# Patient Record
Sex: Male | Born: 1965 | Race: White | Hispanic: No | Marital: Married | Smoking: Never smoker
Health system: Southern US, Community
[De-identification: ages and names within clinical notes are randomized; demographics above are authoritative.]

## PROBLEM LIST (undated history)

## (undated) DIAGNOSIS — I480 Paroxysmal atrial fibrillation: Secondary | ICD-10-CM

## (undated) DIAGNOSIS — G4730 Sleep apnea, unspecified: Secondary | ICD-10-CM

## (undated) DIAGNOSIS — Z8719 Personal history of other diseases of the digestive system: Secondary | ICD-10-CM

## (undated) DIAGNOSIS — I10 Essential (primary) hypertension: Secondary | ICD-10-CM

## (undated) DIAGNOSIS — E7800 Pure hypercholesterolemia, unspecified: Secondary | ICD-10-CM

## (undated) HISTORY — DX: Essential (primary) hypertension: I10

## (undated) HISTORY — PX: LAPAROSCOPIC CHOLECYSTECTOMY: SUR755

## (undated) HISTORY — DX: Personal history of other diseases of the digestive system: Z87.19

## (undated) HISTORY — DX: Pure hypercholesterolemia, unspecified: E78.00

## (undated) HISTORY — PX: LAMINECTOMY: SHX219

## (undated) HISTORY — DX: Paroxysmal atrial fibrillation: I48.0

---

## 2004-07-04 ENCOUNTER — Observation Stay (HOSPITAL_COMMUNITY): Admission: RE | Admit: 2004-07-04 | Discharge: 2004-07-05 | Payer: Self-pay | Admitting: General Surgery

## 2011-10-22 ENCOUNTER — Ambulatory Visit (INDEPENDENT_AMBULATORY_CARE_PROVIDER_SITE_OTHER): Payer: BC Managed Care – PPO | Admitting: Cardiology

## 2011-10-22 ENCOUNTER — Telehealth: Payer: Self-pay | Admitting: Cardiology

## 2011-10-22 ENCOUNTER — Encounter: Payer: Self-pay | Admitting: Cardiology

## 2011-10-22 VITALS — BP 112/72 | HR 73 | Ht 69.0 in | Wt 217.0 lb

## 2011-10-22 DIAGNOSIS — E7800 Pure hypercholesterolemia, unspecified: Secondary | ICD-10-CM

## 2011-10-22 DIAGNOSIS — R002 Palpitations: Secondary | ICD-10-CM

## 2011-10-22 DIAGNOSIS — R0789 Other chest pain: Secondary | ICD-10-CM | POA: Insufficient documentation

## 2011-10-22 DIAGNOSIS — I119 Hypertensive heart disease without heart failure: Secondary | ICD-10-CM

## 2011-10-22 NOTE — Telephone Encounter (Signed)
Pt referred by Dr Tania Ade for irregular heart beat x several months, lasting about 30-60 minutes.  Chest pressure center of chest off and on (uncomfortable feeling) with irregular heart beat x several months.  Also feels weak x last night and today.  His heart was very irregular last night off and on and today.  He is also complaining of chest pressure today.  Pt is at work today.  Pt gets symptoms with exertion and at rest.  He cut out caffeine.

## 2011-10-22 NOTE — Telephone Encounter (Signed)
Pt's wife calling pt having  irregular heartbeat, denies chest pain, SOB, dizziness and lightheadedness, wife wants pt seen today , he is new , wife tara cell [phone]

## 2011-10-22 NOTE — Assessment & Plan Note (Signed)
The patient has been experiencing chest tightness and is concerned about his heart.  He does have risk factors for ischemic heart disease.  We are going to have him return for a treadmill Myoview stress test in the near future to evaluate his symptoms further.  In the meantime the patient is to continue his present medication

## 2011-10-22 NOTE — Patient Instructions (Signed)
Will put on 24 hour monitor Your physician has requested that you have en exercise stress myoview. For further information please visit [email or web address] Please follow instruction sheet, as given. Will call you with the results

## 2011-10-22 NOTE — Progress Notes (Signed)
Kevin Holmes Date of Birth:  [birth year] [facility] [address]   [address] [place], [place]  [address] [phone]           Fax   [phone]  History of Present Illness: This pleasant 45 year old gentleman is seen as a work in consultation evaluation of chest tightness and palpitations.  The patient is followed at [facility]  by Belva Agee.  The patient gives a history of having the onset of occasional palpitations about a year ago.  At that time he tried decreasing his caffeine intake.  Since then the palpitations have increased in frequency.  They occur day or night.  They last anywhere from 10 minutes to 30 minutes.  He did not appear to be related to emotional stress or physical activity.  In addition the patient has had some exertional dyspnea.  He gets out of breath with activity but attributes that to being out of shape.  He has had some chest tightness which does not radiate.  2-3 weeks ago he had an episode of near-syncope associated with pallor and was on the verge of going to the emergency room but decided not to because he was gradually improving.  That episode occurred after he had been doing some outdoor yard work.  The patient has risk factors for coronary disease including high blood pressure and high cholesterol both of which were found at a company mandated physical examination in August 2011.  Since then he has been under medical care and has been on blood pressure medication and lipid-lowering agents His family history is positive for vascular disease.  His father has high blood pressure and his paternal grandfather died of a heart attack at age 16.  His mother has good health.  Current Outpatient Prescriptions  Medication Sig Dispense Refill  . amLODipine-benazepril (LOTREL) 10-20 MG per capsule Take 1 capsule by mouth daily.        Marland Kitchen aspirin 81 MG tablet Take 81 mg by mouth daily.        Marland Kitchen atorvastatin (LIPITOR) 10 MG  tablet Take 10 mg by mouth daily.        Marland Kitchen lisinopril (PRINIVIL,ZESTRIL) 10 MG tablet Take 10 mg by mouth daily.          No Known Allergies  Patient Active Problem List  Diagnoses  . Chest tightness, discomfort, or pressure    History  Smoking status  . Never Smoker   Smokeless tobacco  . Not on file    History  Alcohol Use: Not on file    No family history on file.  Review of Systems: Constitutional: no fever chills diaphoresis or fatigue or change in weight.  Head and neck: no hearing loss, no epistaxis, no photophobia or visual disturbance. Respiratory: No cough, shortness of breath or wheezing. Cardiovascular: Positive for palpitations and chest tightness and near syncope. Gastrointestinal: No abdominal distention, no abdominal pain, no change in bowel habits hematochezia or melena. Genitourinary: No dysuria, no frequency, no urgency, no nocturia. Musculoskeletal:No arthralgias, no back pain, no gait disturbance or myalgias. Neurological: No dizziness, no headaches, no numbness, no seizures, no syncope, no weakness, no tremors. Hematologic: No lymphadenopathy, no easy bruising. Psychiatric: No confusion, no hallucinations, no sleep disturbance.    Physical Exam: Filed Vitals:   10/22/11 1138  BP: 112/72  Pulse: 73   the general appearance reveals a well-developed well-nourished gentleman in no distress.The head and neck exam reveals pupils equal and reactive.  Extraocular movements are full.  There is no scleral icterus.  The mouth and pharynx are normal.  The neck is supple.  The carotids reveal no bruits.  The jugular venous pressure is normal.  The  thyroid is not enlarged.  There is no lymphadenopathy.  The chest is clear to percussion and auscultation.  There are no rales or rhonchi.  Expansion of the chest is symmetrical.  The precordium is quiet.  The first heart sound is normal.  The second heart sound is physiologically split.  There is no murmur gallop rub or  click.  There is no abnormal lift or heave.  The abdomen is soft and nontender.  The bowel sounds are normal.  The liver and spleen are not enlarged.  There are no abdominal masses.  There are no abdominal bruits.  Extremities reveal good pedal pulses.  There is no phlebitis or edema.  There is no cyanosis or clubbing.  Strength is normal and symmetrical in all extremities.  There is no lateralizing weakness.  There are no sensory deficits.  The skin is warm and dry.  There is no rash.  EKG shows normal sinus rhythm and no arrhythmias and EKG at rest is normal.   Assessment / Plan: Continue same medication.  A 24-hour Holter is being applied today.  The patient will return soon for a treadmill Myoview stress test. Thank you for the opportunity to see this pleasant gentleman with you.  We will keep you informed as to the results of his studies

## 2011-10-22 NOTE — Assessment & Plan Note (Signed)
The patient has been having brief palpitations almost daily basis.  His electrocardiogram today shows no premature beats but during my examination I was aware of an occasional premature beat followed by a pause.  We will have him wear a 24-hour Holter monitor to assess his arrhythmia further.

## 2011-10-22 NOTE — Telephone Encounter (Signed)
Appt scheduled with Dr Patty Sermons per Dr Patty Sermons.  Records were requested from his pcp.

## 2011-10-23 ENCOUNTER — Encounter: Payer: Self-pay | Admitting: *Deleted

## 2011-10-29 ENCOUNTER — Telehealth: Payer: Self-pay | Admitting: *Deleted

## 2011-10-29 NOTE — Telephone Encounter (Signed)
Advised of holter results, no significant arrythmia per  Dr. Patty Sermons

## 2011-10-30 ENCOUNTER — Ambulatory Visit (HOSPITAL_COMMUNITY): Payer: BC Managed Care – PPO | Attending: Cardiology | Admitting: Radiology

## 2011-10-30 DIAGNOSIS — I119 Hypertensive heart disease without heart failure: Secondary | ICD-10-CM

## 2011-10-30 DIAGNOSIS — R079 Chest pain, unspecified: Secondary | ICD-10-CM

## 2011-10-30 DIAGNOSIS — I10 Essential (primary) hypertension: Secondary | ICD-10-CM | POA: Insufficient documentation

## 2011-10-30 DIAGNOSIS — R61 Generalized hyperhidrosis: Secondary | ICD-10-CM | POA: Insufficient documentation

## 2011-10-30 DIAGNOSIS — E669 Obesity, unspecified: Secondary | ICD-10-CM | POA: Insufficient documentation

## 2011-10-30 DIAGNOSIS — R002 Palpitations: Secondary | ICD-10-CM

## 2011-10-30 DIAGNOSIS — R0609 Other forms of dyspnea: Secondary | ICD-10-CM | POA: Insufficient documentation

## 2011-10-30 DIAGNOSIS — R0789 Other chest pain: Secondary | ICD-10-CM

## 2011-10-30 DIAGNOSIS — E7800 Pure hypercholesterolemia, unspecified: Secondary | ICD-10-CM

## 2011-10-30 DIAGNOSIS — R0989 Other specified symptoms and signs involving the circulatory and respiratory systems: Secondary | ICD-10-CM | POA: Insufficient documentation

## 2011-10-30 DIAGNOSIS — R42 Dizziness and giddiness: Secondary | ICD-10-CM | POA: Insufficient documentation

## 2011-10-30 DIAGNOSIS — R000 Tachycardia, unspecified: Secondary | ICD-10-CM | POA: Insufficient documentation

## 2011-10-30 DIAGNOSIS — R0602 Shortness of breath: Secondary | ICD-10-CM

## 2011-10-30 DIAGNOSIS — E785 Hyperlipidemia, unspecified: Secondary | ICD-10-CM | POA: Insufficient documentation

## 2011-10-30 DIAGNOSIS — R55 Syncope and collapse: Secondary | ICD-10-CM

## 2011-10-30 MED ORDER — TECHNETIUM TC 99M TETROFOSMIN IV KIT
11.0000 | PACK | Freq: Once | INTRAVENOUS | Status: AC | PRN
  Administered 2011-10-30: 11 via INTRAVENOUS

## 2011-10-30 MED ORDER — TECHNETIUM TC 99M TETROFOSMIN IV KIT
33.0000 | PACK | Freq: Once | INTRAVENOUS | Status: AC | PRN
  Administered 2011-10-30: 33 via INTRAVENOUS

## 2011-10-30 NOTE — Progress Notes (Signed)
Kevin Holmes is a 45 y.o. male [id number] 1965-11-23   Nuclear Med Background Indication for Stress Test:  Evaluation for Ischemia History:  No previous documented CAD Cardiac Risk Factors: Hypertension, Lipids and Obesity  Symptoms:  Chest Pressure.  (last date of chest discomfort ), Chest Tightness with Exertion (last date of chest discomfort was Sunday, 10/26/11), Diaphoresis, Dizziness, DOE, Near Syncope, Palpitations and Rapid HR    Nuclear Pre-Procedure Caffeine/Decaff Intake:  None NPO After: 7:00pm   Lungs:  Clear IV 0.9% NS with Angio Cath:  20g  IV Site: R Antecubital  IV Started by:  Stanton Kidney, EMT-P  Chest Size (in):  44 Cup Size: n/a  Height: 5\' 9"  (1.753 m)  Weight:  218 lb (98.884 kg)  BMI:  Body mass index is 32.19 kg/(m^2). Tech Comments:  NA    Nuclear Med Study 1 or 2 day study: 1 day  Stress Test Type:  Stress  Reading MD: Willa Rough, MD  Order Authorizing Provider:  Cassell Clement, MD  Resting Radionuclide: Technetium 38m Tetrofosmin  Resting Radionuclide Dose: 11.0 mCi   Stress Radionuclide:  Technetium 36m Tetrofosmin  Stress Radionuclide Dose: 33.0 mCi           Stress Protocol Rest HR: 67 Stress HR: 166  Rest BP: Sitting:117/72  Standing:121/75 Stress BP: 166/63  Exercise Time (min): 8:00 METS: 10.1   Predicted Max HR: 173 bpm % Max HR: 95.95 bpm Rate Pressure Product: 57846   Dose of Adenosine (mg):  n/a Dose of Lexiscan: n/a mg  Dose of Atropine (mg): n/a Dose of Dobutamine: n/a mcg/kg/min (at max HR)  Stress Test Technologist: Smiley Houseman, CMA-N  Nuclear Technologist:  Domenic Polite, CNMT     Rest Procedure:  Myocardial perfusion imaging was performed at rest 45 minutes following the intravenous administration of Technetium 44m Tetrofosmin.  Rest ECG: No acute changes.  Stress Procedure:  The  patient exercised for eight minutes on the treadmill utilizing the Bruce protocol.  The patient stopped due to fatigue and denied any chest pain.  There were no diagnostic ST-T wave changes.  There were occasional PAC's.  Technetium 66m Tetrofosmin was injected at peak exercise and myocardial perfusion imaging was performed after a brief delay . Stress ECG: No significant change from baseline ECG  QPS Raw Data Images:  Normal; no motion artifact; normal heart/lung ratio. Stress Images:  Normal homogeneous uptake in all areas of the myocardium. Rest Images:  Normal homogeneous uptake in all areas of the myocardium. Subtraction (SDS):  No evidence of ischemia. Transient Ischemic Dilatation (Normal <1.22):  0.89 Lung/Heart Ratio (Normal <0.45):  0.27  Quantitative Gated Spect Images QGS EDV:  131 ml QGS ESV:  52 ml QGS cine images:  Normal Wall Motion QGS EF: 60%  Impression Exercise Capacity:  Fair exercise capacity. BP Response:  Normal blood pressure response. Clinical Symptoms:  Leg burning ECG Impression:  No significant ST segment change suggestive of ischemia. Comparison with Prior Nuclear Study: No previous nuclear study performed  Overall Impression:  Normal stress nuclear study.  Tinnie Gens Katz,MD

## 2011-11-01 ENCOUNTER — Telehealth: Payer: Self-pay | Admitting: *Deleted

## 2011-11-01 NOTE — Telephone Encounter (Signed)
Advised patient.  Cancelled appointment with Dr Jens Som for consult since  Dr. Patty Sermons saw him as a work in.  Patient states no palpitations for few days, but if they come back is to call and schedule 30 day monitor

## 2011-11-01 NOTE — Telephone Encounter (Signed)
Message copied by Burnell Blanks on Fri Nov 01, 2011  5:04 PM ------      Message from: Cassell Clement      Created: Thu Oct 31, 2011 10:26 AM       Please report.  The stress test was normal.  There was no evidence of ischemia.  The left ventricular function was normal and the ejection fraction was 60%.

## 2011-11-08 ENCOUNTER — Encounter: Payer: Self-pay | Admitting: Cardiology

## 2011-11-29 ENCOUNTER — Ambulatory Visit: Payer: Self-pay | Admitting: Cardiology

## 2011-12-12 ENCOUNTER — Encounter: Payer: Self-pay | Admitting: Cardiology

## 2011-12-12 ENCOUNTER — Ambulatory Visit (INDEPENDENT_AMBULATORY_CARE_PROVIDER_SITE_OTHER): Payer: BC Managed Care – PPO | Admitting: Cardiology

## 2011-12-12 DIAGNOSIS — E785 Hyperlipidemia, unspecified: Secondary | ICD-10-CM

## 2011-12-12 DIAGNOSIS — R0789 Other chest pain: Secondary | ICD-10-CM

## 2011-12-12 DIAGNOSIS — R002 Palpitations: Secondary | ICD-10-CM

## 2011-12-12 DIAGNOSIS — I10 Essential (primary) hypertension: Secondary | ICD-10-CM | POA: Insufficient documentation

## 2011-12-12 NOTE — Assessment & Plan Note (Signed)
Holter monitor unrevealing. If he has worsening symptoms in the future we will proceed with a CardioNet. It is not clear that he had symptoms while wearing his Holter monitor.

## 2011-12-12 NOTE — Progress Notes (Signed)
   HPI: Pleasant gentleman seen by Dr. Patty Sermons in December of 2012 with complaints of chest pain and palpitations. Holter monitor in December of 2012 showed no significant arrhythmia. Myoview in December of 2012 showed an ejection fraction of 60% and normal perfusion. Since he was last seen, the patient has dyspnea with more extreme activities but not with routine activities. It is relieved with rest. It is not associated with chest pain. There is no orthopnea, PND or pedal edema. There is no syncope. There is no exertional chest pain. He had brief palpitations but not sustained. He states he had palpitations while wearing his Holter but it was the last 15 minutes and not clear battery was working.    Current Outpatient Prescriptions  Medication Sig Dispense Refill  . amLODipine (NORVASC) 5 MG tablet Take 5 mg by mouth daily.      Marland Kitchen aspirin 81 MG tablet Take 81 mg by mouth daily.        Marland Kitchen atorvastatin (LIPITOR) 10 MG tablet Take 40 mg by mouth daily.       Marland Kitchen lisinopril-hydrochlorothiazide (PRINZIDE,ZESTORETIC) 20-12.5 MG per tablet Take 1 tablet by mouth daily.         Past Medical History  Diagnosis Date  . Pure hypercholesterolemia   . Benign hypertensive heart disease without heart failure   . History of gallstones     LAPAROSCOPIC CHOLECYSTECTOMY 2005    Past Surgical History  Procedure Date  . Laparoscopic cholecystectomy 07/04/2004 [facility]    DR. PAUL TOTH    History   Social History  . Marital Status: Single    Spouse Name: N/A    Number of Children: N/A  . Years of Education: N/A   Occupational History  . Not on file.   Social History Main Topics  . Smoking status: Never Smoker   . Smokeless tobacco: Not on file  . Alcohol Use: Not on file  . Drug Use: Not on file  . Sexually Active: Not on file   Other Topics Concern  . Not on file   Social History Narrative  . No narrative on file    ROS: no fevers or chills, productive cough, hemoptysis, dysphasia,  odynophagia, melena, hematochezia, dysuria, hematuria, rash, seizure activity, orthopnea, PND, pedal edema, claudication. Remaining systems are negative.  Physical Exam: Well-developed well-nourished in no acute distress.  Skin is warm and dry.  HEENT is normal.  Neck is supple. No thyromegaly.  Chest is clear to auscultation with normal expansion.  Cardiovascular exam is regular rate and rhythm.  Abdominal exam nontender or distended. No masses palpated. Extremities show no edema. neuro grossly intact

## 2011-12-12 NOTE — Assessment & Plan Note (Signed)
No further chest pain. Stress test negative. No further workup.

## 2011-12-12 NOTE — Assessment & Plan Note (Signed)
Continue statin. Managed by primary care. 

## 2011-12-12 NOTE — Patient Instructions (Addendum)
Your physician wants you to follow-up in: 6 MONTHS You will receive a reminder letter in the mail two months in advance. If you don't receive a letter, please call our office to schedule the follow-up appointment. 

## 2011-12-12 NOTE — Assessment & Plan Note (Signed)
Blood pressure controlled. Continue present medications. 

## 2012-07-28 ENCOUNTER — Telehealth: Payer: Self-pay | Admitting: Cardiology

## 2012-07-28 NOTE — Telephone Encounter (Signed)
New Problem:     I called the patient and was unable to reach them. I left a message on their voicemail with my name, the reason I called, the name of his physician, and a number to call back to schedule their appointment. 

## 2012-07-29 ENCOUNTER — Ambulatory Visit (INDEPENDENT_AMBULATORY_CARE_PROVIDER_SITE_OTHER): Payer: BC Managed Care – PPO | Admitting: Physician Assistant

## 2012-07-29 ENCOUNTER — Encounter: Payer: Self-pay | Admitting: Physician Assistant

## 2012-07-29 VITALS — BP 126/90 | HR 64 | Ht 69.0 in | Wt 201.0 lb

## 2012-07-29 DIAGNOSIS — I10 Essential (primary) hypertension: Secondary | ICD-10-CM

## 2012-07-29 DIAGNOSIS — I4891 Unspecified atrial fibrillation: Secondary | ICD-10-CM | POA: Insufficient documentation

## 2012-07-29 MED ORDER — DILTIAZEM HCL ER COATED BEADS 240 MG PO TB24: 240.0000 mg | ORAL_TABLET | Freq: Every day | ORAL | Status: DC

## 2012-07-29 NOTE — Progress Notes (Signed)
HPI:  This is a very pleasant 46 year old white male patient who has history of hypertension and hyperlipidemia. He also has a history of palpitations but has never had any documented arrhythmias. Dr. Modesto Charon placed the monitor on the patient and he had an episode of atrial fibrillation at 150 bpm  that lasted about 2 hours.the patient has had 2 of these episodes in the past 3 weeks that lasted over 2 hours. In between now he's had occasional flutter but nothing prolonged period prior to that it occurs approximately every 3 months. He has lost 30 pounds on a strict diet and was cut out all caffeine. He was able to come off his amlodipine over the summer because his blood pressure dropped. He has had extra salt recently his blood pressure went back up so he restarted the amlodipine. He says he feels a little sore in his chest that when he moves in certain positions. He denies any exertional chest tightness, pressure, or syncope. When he is in a rapid atrial fibrillation he does get short of breath and dizzy. He had a negative stress myoview  December 2012. No Known Allergies  Current Outpatient Prescriptions on File Prior to Visit: aspirin 81 MG tablet, Take 81 mg by mouth daily.  , Disp: , Rfl:  atorvastatin (LIPITOR) 10 MG tablet, Take 40 mg by mouth daily. , Disp: , Rfl:  lisinopril (PRINIVIL,ZESTRIL) 20 MG tablet, , Disp: , Rfl:     Past Medical History:   Pure hypercholesterolemia                                    Benign hypertensive heart disease without hear*              History of gallstones                                          Comment:LAPAROSCOPIC CHOLECYSTECTOMY 2005  Past Surgical History:   LAPAROSCOPIC CHOLECYSTECTOMY                    08/24/200*     Comment:DR. PAUL TOTH  Review of patient's family history indicates:   Hypertension                   Father                   Heart attack                   Paternal Grandfather     Social History   Marital Status: Single               Spouse Name:                      Years of Education:                 Number of children:             Occupational History   None on file  Social History Main Topics   Smoking Status: Never Smoker                     Smokeless Status: Not on file  Alcohol Use: Not on file    Drug Use: Not on file    Sexual Activity: Not on file        Other Topics            Concern   None on file  Social History Narrative   None on file    ZOX:WRUE states that he had symptoms of sleep apnea with waking up and stopping breathing briefly as well as excessive snoring. These symptoms improved with the weight loss but he still has them occasionally.see history of present illness otherwise negative   PHYSICAL EXAM: Well-nournished, in no acute distress. Neck: No JVD, HJR, Bruit, or thyroid enlargement  Lungs: No tachypnea, clear without wheezing, rales, or rhonchi  Cardiovascular: RRR, PMI not displaced, heart sounds normal, no murmurs, gallops, bruit, thrill, or heave.  Abdomen: BS normal. Soft without organomegaly, masses, lesions or tenderness.  Extremities: without cyanosis, clubbing or edema. Good distal pulses bilateral  SKin: Warm, no lesions or rashes   Musculoskeletal: No deformities  Neuro: no focal signs  BP 126/90  Pulse 64  Ht 5\' 9"  (1.753 m)  Wt 201 lb (91.173 kg)  BMI 29.68 kg/m2   AVW:[id number] sinus rhythm at 61 beats per minute

## 2012-07-29 NOTE — Patient Instructions (Addendum)
**Note De-Identified Drenda Sobecki Obfuscation** Your physician has requested that you have an echocardiogram. Echocardiography is a painless test that uses sound waves to create images of your heart. It provides your doctor with information about the size and shape of your heart and how well your heart's chambers and valves are working. This procedure takes approximately one hour. There are no restrictions for this procedure.  Your physician has recommended you make the following change in your medication: Stop taking Amlodipine and start taking Cardizem 240 mg daily  Your physician recommends that you schedule a follow-up appointment in: 2 weeks with Dr. Jens Som (October 7 at 11:45)

## 2012-07-29 NOTE — Assessment & Plan Note (Signed)
Blood pressure stable today. Recommend 2 g sodium diet.

## 2012-07-29 NOTE — Assessment & Plan Note (Addendum)
Patient has documented atrial fibrillation and cardiomegaly to order from Dr. Modesto Charon. I discussed this patient in detail with Dr. Johney Frame. He recommends Cardizem 240 mg daily. We will stop his Norvasc. His chest cores one therefore he will not need Coumadin. He will continue on his aspirin. He will follow-up with Dr. Jens Som in 2 weeks. We will also check a 2-D echo.

## 2012-08-03 ENCOUNTER — Ambulatory Visit (HOSPITAL_COMMUNITY): Payer: BC Managed Care – PPO | Attending: Physician Assistant | Admitting: Radiology

## 2012-08-03 DIAGNOSIS — I10 Essential (primary) hypertension: Secondary | ICD-10-CM | POA: Insufficient documentation

## 2012-08-03 DIAGNOSIS — R002 Palpitations: Secondary | ICD-10-CM

## 2012-08-03 DIAGNOSIS — I4891 Unspecified atrial fibrillation: Secondary | ICD-10-CM

## 2012-08-03 NOTE — Progress Notes (Signed)
Echocardiogram performed.  

## 2012-08-17 ENCOUNTER — Ambulatory Visit (INDEPENDENT_AMBULATORY_CARE_PROVIDER_SITE_OTHER): Payer: BC Managed Care – PPO | Admitting: Cardiology

## 2012-08-17 ENCOUNTER — Encounter: Payer: Self-pay | Admitting: Cardiology

## 2012-08-17 VITALS — BP 142/83 | HR 71 | Ht 69.0 in | Wt 201.8 lb

## 2012-08-17 DIAGNOSIS — I4891 Unspecified atrial fibrillation: Secondary | ICD-10-CM

## 2012-08-17 DIAGNOSIS — I10 Essential (primary) hypertension: Secondary | ICD-10-CM

## 2012-08-17 MED ORDER — FLECAINIDE ACETATE 50 MG PO TABS: 50.0000 mg | ORAL_TABLET | Freq: Two times a day (BID) | ORAL | Status: DC

## 2012-08-17 NOTE — Assessment & Plan Note (Signed)
The patient continues to have atrial fibrillation that is symptomatic. His episodes typically last 2 hours. Plan to continue Cardizem. Add flecainide 50 mg by mouth twice a day. Proceed with stress Myoview in 5 days to exclude ischemia and to rule out exercise induced ventricular tachycardia given the addition of flecainide. If his symptoms continue despite antiarrhythmic therapy we will asked Dr. Johney Frame to review for consideration of ablation. Embolic risk factor of hypertension only. Continue aspirin.

## 2012-08-17 NOTE — Progress Notes (Signed)
   HPI: Pleasant gentleman seen by Dr. Patty Sermons in December of 2012 with complaints of chest pain and palpitations. Holter monitor in December of 2012 showed no significant arrhythmia. Myoview in December of 2012 showed an ejection fraction of 60% and normal perfusion. Patient recently had a monitor for recurrent palpitations that showed paroxysmal atrial fibrillation. Seen by Wynell Balloon and Cardizem initiated. Echocardiogram in September of 2013 showed normal LV function. Since he was last seen, he has had several recurrent episodes of atrial fibrillation. He feels palpitations and these typically last 2 hours and he spontaneously converts. He has dyspnea with more extreme activities but no exertional chest pain. No orthopnea, PND or pedal edema. No syncope.   Current Outpatient Prescriptions  Medication Sig Dispense Refill  . aspirin 81 MG tablet Take 81 mg by mouth daily.        Marland Kitchen atorvastatin (LIPITOR) 10 MG tablet Take 40 mg by mouth daily.       Marland Kitchen diltiazem (CARDIZEM LA) 240 MG 24 hr tablet Take 1 tablet (240 mg total) by mouth daily.  30 tablet  3  . lisinopril (PRINIVIL,ZESTRIL) 20 MG tablet          Past Medical History  Diagnosis Date  . Pure hypercholesterolemia   . Benign hypertensive heart disease without heart failure   . History of gallstones     LAPAROSCOPIC CHOLECYSTECTOMY 2005    Past Surgical History  Procedure Date  . Laparoscopic cholecystectomy 07/04/2004 [facility]    DR. PAUL TOTH    History   Social History  . Marital Status: Single    Spouse Name: N/A    Number of Children: N/A  . Years of Education: N/A   Occupational History  . Not on file.   Social History Main Topics  . Smoking status: Never Smoker   . Smokeless tobacco: Not on file  . Alcohol Use: Not on file  . Drug Use: Not on file  . Sexually Active: Not on file   Other Topics Concern  . Not on file   Social History Narrative  . No narrative on file    ROS: no fevers or chills,  productive cough, hemoptysis, dysphasia, odynophagia, melena, hematochezia, dysuria, hematuria, rash, seizure activity, orthopnea, PND, pedal edema, claudication. Remaining systems are negative.  Physical Exam: Well-developed well-nourished in no acute distress.  Skin is warm and dry.  HEENT is normal.  Neck is supple. Chest is clear to auscultation with normal expansion.  Cardiovascular exam is regular rate and rhythm.  Abdominal exam nontender or distended. No masses palpated. Extremities show no edema. neuro grossly intact

## 2012-08-17 NOTE — Patient Instructions (Addendum)
Your physician recommends that you schedule a follow-up appointment in: 6 WEEKS WITH DR Jens Som  Your physician has requested that you have en exercise stress myoview. For further information please visit [email or web address] Please follow instruction sheet, as given. SCHEDULE Monday 08-24-12  START FLECAINIDE 50 MG ONE TABLET TWICE DAILY

## 2012-08-17 NOTE — Assessment & Plan Note (Signed)
Blood pressure controlled. Continue present medications. 

## 2012-08-17 NOTE — Telephone Encounter (Signed)
F/u  Questions regarding medication.

## 2012-08-17 NOTE — Telephone Encounter (Signed)
Spoke with pt, aware to take flecainide and cardizem together

## 2012-08-24 ENCOUNTER — Ambulatory Visit (HOSPITAL_COMMUNITY): Payer: BC Managed Care – PPO | Attending: Cardiology | Admitting: Radiology

## 2012-08-24 VITALS — BP 123/79 | HR 57 | Ht 69.5 in | Wt 200.0 lb

## 2012-08-24 DIAGNOSIS — I4891 Unspecified atrial fibrillation: Secondary | ICD-10-CM

## 2012-08-24 DIAGNOSIS — R0789 Other chest pain: Secondary | ICD-10-CM | POA: Insufficient documentation

## 2012-08-24 DIAGNOSIS — E785 Hyperlipidemia, unspecified: Secondary | ICD-10-CM

## 2012-08-24 DIAGNOSIS — R0602 Shortness of breath: Secondary | ICD-10-CM

## 2012-08-24 DIAGNOSIS — R0989 Other specified symptoms and signs involving the circulatory and respiratory systems: Secondary | ICD-10-CM | POA: Insufficient documentation

## 2012-08-24 DIAGNOSIS — R079 Chest pain, unspecified: Secondary | ICD-10-CM

## 2012-08-24 DIAGNOSIS — R000 Tachycardia, unspecified: Secondary | ICD-10-CM | POA: Insufficient documentation

## 2012-08-24 DIAGNOSIS — I999 Unspecified disorder of circulatory system: Secondary | ICD-10-CM | POA: Insufficient documentation

## 2012-08-24 DIAGNOSIS — I10 Essential (primary) hypertension: Secondary | ICD-10-CM

## 2012-08-24 DIAGNOSIS — R0609 Other forms of dyspnea: Secondary | ICD-10-CM | POA: Insufficient documentation

## 2012-08-24 DIAGNOSIS — R002 Palpitations: Secondary | ICD-10-CM

## 2012-08-24 MED ORDER — TECHNETIUM TC 99M SESTAMIBI GENERIC - CARDIOLITE
11.0000 | Freq: Once | INTRAVENOUS | Status: AC | PRN
  Administered 2012-08-24: 11 via INTRAVENOUS

## 2012-08-24 MED ORDER — TECHNETIUM TC 99M SESTAMIBI GENERIC - CARDIOLITE
33.0000 | Freq: Once | INTRAVENOUS | Status: AC | PRN
  Administered 2012-08-24: 33 via INTRAVENOUS

## 2012-08-24 NOTE — Progress Notes (Signed)
[facility] SITE 3 NUCLEAR MED [id number] [place] [address] [phone]  Cardiology Nuclear Med Study  Kevin Holmes is a 46 y.o. male     MRN : [id number]     DOB: 12-Feb-1966  Procedure Date: 08/24/2012  Nuclear Med Background Indication for Stress Test:  Evaluation for Ischemia and New Onset AFIB on Flecainide/Assess for Arrhythmias History:  '12 GEX:BMWUXL, EF=60%; 9/13 Echo:EF=65%, mild LVH Cardiac Risk Factors: Hypertension and Lipids  Symptoms:  Chest Pain/Pressure with and without Exertion (last episode of chest discomfort was about a week ago), Diaphoresis, DOE, Rapid Heart Rate, (Dizziness and  Palpitations with afib)   Nuclear Pre-Procedure Caffeine/Decaff Intake:  None > 12 hrs NPO After: 11:30pm   Lungs:  Clear. O2 Sat: 98% on room air. IV 0.9% NS with Angio Cath:  20g  IV Site: R Antecubital x 1, tolerated well  IV Started by:  Irean Hong, RN  Chest Size (in):  44 Cup Size: n/a  Height: 5' 9.5" (1.765 m)  Weight:  200 lb (90.719 kg)  BMI:  Body mass index is 29.11 kg/(m^2). Tech Comments:  Patient held diltiazem this am.Patient took Flecainide this am    Nuclear Med Study 1 or 2 day study: 1 day  Stress Test Type:  Stress  Reading MD: [name], MD  Order Authorizing Provider:  Olga Millers, MD  Resting Radionuclide: Technetium 5m Sestamibi  Resting Radionuclide Dose: 11.0 mCi   Stress Radionuclide:  Technetium 14m Sestamibi  Stress Radionuclide Dose: 33.0 mCi           Stress Protocol Rest HR: 57 Stress HR: 155  Rest BP: 123/79 Stress BP: 187/71  Exercise Time (min): 11:30 METS: 11.3   Predicted Max HR: 173 bpm % Max HR: 89.6 bpm Rate Pressure Product: 24401   Dose of Adenosine (mg):  n/a Dose of Lexiscan: n/a mg  Dose of Atropine (mg): n/a Dose of Dobutamine: n/a mcg/kg/min (at max HR)  Stress Test Technologist: Smiley Houseman, CMA-N  Nuclear Technologist:  Domenic Polite, CNMT     Rest Procedure:   Myocardial perfusion imaging was performed at rest 45 minutes following the intravenous administration of Technetium 23m Tetrofosmin.  Rest ECG: Sinus bradycardia, otherwise WNL.  Stress Procedure:  The patient exercised on the treadmill utilizing the Bruce  Protocol for 11:30 minutes. He then stopped due to fatigue and denied any chest pain.  There were no diagnostic ST-T wave changes.  Technetium 20m Tetrofosmin was injected at peak exercise and myocardial perfusion imaging was performed after a brief delay.  Stress ECG: No significant change from baseline ECG  QPS Raw Data Images:  Stress images were motion corrected.  Soft tissue (diaphragm, bowel activity) underlie inferior wall. Stress Images:  Thinning with decreased tracer activity in the inferior/inferoseptal wall (base, mid) (as seen in short axis only) and apex.  Otherwise normal perfusion. Rest Images: Increased tracer counts in the apex. Subtraction (SDS): Small region of mild apical ischemia. Transient Ischemic Dilatation (Normal <1.22):  0.93 Lung/Heart Ratio (Normal <0.45):  0.33  Quantitative Gated Spect Images QGS EDV:  138 ml QGS ESV:  60 ml  Impression Exercise Capacity:  Excellent exercise capacity. BP Response:  Normal blood pressure response. Clinical Symptoms:  No chest pain. ECG Impression:  No significant ST segment change suggestive of ischemia. Comparison with Prior Nuclear Study:Normal perfusion on scan of Dec 2012.  Overall Impression:  Small region of apical ischemia (mild) Otherwise probable normal perfusion.  Overall  low risk scan.  LV Ejection Fraction: 56%.  LV Wall Motion:  NL LV Function; NL Wall Motion  [name]

## 2012-08-26 ENCOUNTER — Telehealth: Payer: Self-pay | Admitting: Cardiology

## 2012-08-26 NOTE — Telephone Encounter (Signed)
Spoke with pt wife, questions answered. 

## 2012-08-26 NOTE — Telephone Encounter (Signed)
New Problem:    Patient's wife called in with one more question about her husbands blood work.  Please call back.

## 2012-08-27 ENCOUNTER — Telehealth: Payer: Self-pay | Admitting: Cardiology

## 2012-08-27 NOTE — Telephone Encounter (Signed)
New problem:  C/O Afib last night last about 12 hours. No er visit.

## 2012-08-27 NOTE — Telephone Encounter (Signed)
Arrange fu ov Prentis Langdon  

## 2012-08-27 NOTE — Telephone Encounter (Signed)
Spoke with pt wife, follow up appt moved up sooner

## 2012-08-27 NOTE — Telephone Encounter (Signed)
Spoke with pt wife, last night while driving home from work he went back into atrial fib, it lasted until around 3 am. He took his evening dose of flecainide when he got home. Today he is just fatigued. Will forward for dr Jens Som review

## 2012-09-07 ENCOUNTER — Encounter: Payer: Self-pay | Admitting: Cardiology

## 2012-09-07 ENCOUNTER — Ambulatory Visit (INDEPENDENT_AMBULATORY_CARE_PROVIDER_SITE_OTHER): Payer: BC Managed Care – PPO | Admitting: Cardiology

## 2012-09-07 VITALS — BP 130/83 | HR 65 | Ht 69.0 in | Wt 201.0 lb

## 2012-09-07 DIAGNOSIS — I10 Essential (primary) hypertension: Secondary | ICD-10-CM

## 2012-09-07 DIAGNOSIS — R9430 Abnormal result of cardiovascular function study, unspecified: Secondary | ICD-10-CM | POA: Insufficient documentation

## 2012-09-07 DIAGNOSIS — E785 Hyperlipidemia, unspecified: Secondary | ICD-10-CM

## 2012-09-07 DIAGNOSIS — I4891 Unspecified atrial fibrillation: Secondary | ICD-10-CM

## 2012-09-07 MED ORDER — RIVAROXABAN 20 MG PO TABS: 20.0000 mg | ORAL_TABLET | Freq: Every day | ORAL | Status: DC

## 2012-09-07 NOTE — Assessment & Plan Note (Signed)
Blood pressure controlled. Continue present medications. 

## 2012-09-07 NOTE — Assessment & Plan Note (Signed)
Patient is having recurrent atrial fibrillation despite low-dose flecainide. We discussed the options today. I will ask Dr. Johney Frame to see the patient for consideration of ablation (versus a different antiarrhythmic or increasing the dose of his flecainide). It should be noted that his nuclear study suggested mild apical ischemia and he may require cardiac catheterization to exclude coronary disease if flecainide is needed long-term. He has embolic risk factor of hypertension. He states recently his hemoglobin A1c was elevated but improved after changing his diet and weight loss. CHADS score 1. I will discontinue his aspirin and begin xeralto 20 mg daily if his renal function is normal. We will check that today. I will also check his hemoglobin and a TSH. Note LV function is normal.

## 2012-09-07 NOTE — Assessment & Plan Note (Signed)
Continue statin. 

## 2012-09-07 NOTE — Assessment & Plan Note (Signed)
I have reviewed the patient's nuclear study with him. There appears to be mild apical ischemia but I do not consider this a high risk study. He is not having exertional chest pain. We have elected to continue with observation. We could consider catheterization in the future if he has worsening symptoms or if flecainide his needed long-term (and CAD would need to be excluded). Otherwise plan repeat functional study in one year.

## 2012-09-07 NOTE — Patient Instructions (Addendum)
Your physician recommends that you schedule a follow-up appointment in: 4 MONTHS WITH DR Jens Som  Your physician recommends that you HAVE BLOOD WORK TODAY  STOP ASPIRIN   STOP FLECAINIDE  START XARELTO 20 MG ONCE DAILY  REFERRAL TO DR Johney Frame FOR ATRIAL FIB

## 2012-09-07 NOTE — Progress Notes (Signed)
   HPI: Pleasant gentleman for fu of atrial fibrillation; seen by Dr. Patty Sermons in December of 2012 with complaints of chest pain and palpitations. Holter monitor in December of 2012 showed no significant arrhythmia. Myoview in December of 2012 showed an ejection fraction of 60% and normal perfusion. Patient recently had a monitor for recurrent palpitations that showed paroxysmal atrial fibrillation. Seen by Wynell Balloon and Cardizem initiated. Echocardiogram in September of 2013 showed normal LV function. Last saw him we added flecainide for recurrent symptomatic atrial fibrillation. A followup nuclear study showed mild apical ischemia. Since then, he has not had dyspnea on exertion, orthopnea, PND, pedal edema, exertional chest pain or syncope. Initially after starting flecainide his symptoms improved. However after one week he continued to have symptoms of palpitations similar to symptoms with atrial fibrillation. They last 2-3 hours and he has some weakness following.   Current Outpatient Prescriptions  Medication Sig Dispense Refill  . atorvastatin (LIPITOR) 10 MG tablet Take 10 mg by mouth daily.       Marland Kitchen diltiazem (TIAZAC) 300 MG 24 hr capsule Take 300 mg by mouth daily.      Marland Kitchen lisinopril (PRINIVIL,ZESTRIL) 20 MG tablet Take 20 mg by mouth daily.       . Rivaroxaban (XARELTO) 20 MG TABS Take 1 tablet (20 mg total) by mouth daily.  30 tablet  6     Past Medical History  Diagnosis Date  . Pure hypercholesterolemia   . Benign hypertensive heart disease without heart failure   . History of gallstones     LAPAROSCOPIC CHOLECYSTECTOMY 2005  . Atrial fibrillation     Past Surgical History  Procedure Date  . Laparoscopic cholecystectomy 07/04/2004 [facility]    DR. PAUL TOTH    History   Social History  . Marital Status: Single    Spouse Name: N/A    Number of Children: N/A  . Years of Education: N/A   Occupational History  . Not on file.   Social History Main Topics  . Smoking  status: Never Smoker   . Smokeless tobacco: Not on file  . Alcohol Use: Not on file  . Drug Use: Not on file  . Sexually Active: Not on file   Other Topics Concern  . Not on file   Social History Narrative  . No narrative on file    ROS: no fevers or chills, productive cough, hemoptysis, dysphasia, odynophagia, melena, hematochezia, dysuria, hematuria, rash, seizure activity, orthopnea, PND, pedal edema, claudication. Remaining systems are negative.  Physical Exam: Well-developed well-nourished in no acute distress.  Skin is warm and dry.  HEENT is normal.  Neck is supple.  Chest is clear to auscultation with normal expansion.  Cardiovascular exam is regular rate and rhythm.  Abdominal exam nontender or distended. No masses palpated. Extremities show no edema. neuro grossly intact

## 2012-09-08 LAB — BASIC METABOLIC PANEL
BUN: 12 mg/dL (ref 6–23)
CO2: 28 mEq/L (ref 19–32)
Creatinine, Ser: 0.8 mg/dL (ref 0.4–1.5)
GFR: 116.36 mL/min (ref >60.00)

## 2012-09-08 LAB — CBC WITH DIFFERENTIAL/PLATELET
Eosinophils Absolute: 0.1 10*3/uL (ref 0.0–0.7)
Lymphocytes Relative: 26.2 % (ref 12.0–46.0)
Lymphs Abs: 1.5 10*3/uL (ref 0.7–4.0)
Monocytes Absolute: 0.4 10*3/uL (ref 0.1–1.0)
Neutro Abs: 3.6 10*3/uL (ref 1.4–7.7)
Neutrophils Relative %: 63 % (ref 43.0–77.0)
Platelets: 206 10*3/uL (ref 150.0–400.0)
RBC: 4.75 Mil/uL (ref 4.22–5.81)
RDW: 13.4 % (ref 11.5–14.6)

## 2012-09-08 LAB — TSH: TSH: 1.28 u[IU]/mL (ref 0.35–5.50)

## 2012-09-09 ENCOUNTER — Telehealth: Payer: Self-pay | Admitting: Cardiology

## 2012-09-09 DIAGNOSIS — I4891 Unspecified atrial fibrillation: Secondary | ICD-10-CM

## 2012-09-09 NOTE — Telephone Encounter (Signed)
Pt returning nurse call, he can be reached at hm or cell#

## 2012-09-09 NOTE — Telephone Encounter (Signed)
Spoke with pt, aware labs are normal. He will stop asa today and start xarelto 20 mg once daily tomorrow. He will have labs drawn in 4 weeks.

## 2012-09-29 ENCOUNTER — Ambulatory Visit: Payer: BC Managed Care – PPO | Admitting: Cardiology

## 2012-10-05 ENCOUNTER — Ambulatory Visit (INDEPENDENT_AMBULATORY_CARE_PROVIDER_SITE_OTHER): Payer: BC Managed Care – PPO | Admitting: Internal Medicine

## 2012-10-05 ENCOUNTER — Encounter: Payer: Self-pay | Admitting: *Deleted

## 2012-10-05 ENCOUNTER — Encounter: Payer: Self-pay | Admitting: Internal Medicine

## 2012-10-05 VITALS — BP 128/82 | HR 74 | Ht 70.0 in | Wt 202.0 lb

## 2012-10-05 DIAGNOSIS — R0789 Other chest pain: Secondary | ICD-10-CM

## 2012-10-05 DIAGNOSIS — R9439 Abnormal result of other cardiovascular function study: Secondary | ICD-10-CM

## 2012-10-05 DIAGNOSIS — I10 Essential (primary) hypertension: Secondary | ICD-10-CM

## 2012-10-05 DIAGNOSIS — I4891 Unspecified atrial fibrillation: Secondary | ICD-10-CM

## 2012-10-05 LAB — BASIC METABOLIC PANEL
CO2: 30 mEq/L (ref 19–32)
Calcium: 9 mg/dL (ref 8.4–10.5)
Chloride: 104 mEq/L (ref 96–112)
Creatinine, Ser: 0.8 mg/dL (ref 0.4–1.5)
GFR: 108.07 mL/min (ref >60.00)
Potassium: 3.6 mEq/L (ref 3.5–5.1)
Sodium: 139 mEq/L (ref 135–145)

## 2012-10-05 LAB — CBC WITH DIFFERENTIAL/PLATELET
Eosinophils Absolute: 0.1 10*3/uL (ref 0.0–0.7)
Eosinophils Relative: 1.2 % (ref 0.0–5.0)
Hemoglobin: 13.6 g/dL (ref 13.0–17.0)
Lymphocytes Relative: 23.6 % (ref 12.0–46.0)
Lymphs Abs: 1.2 10*3/uL (ref 0.7–4.0)
MCHC: 33 g/dL (ref 30.0–36.0)
Monocytes Relative: 8.8 % (ref 3.0–12.0)
Neutro Abs: 3.4 10*3/uL (ref 1.4–7.7)
RDW: 13.3 % (ref 11.5–14.6)
WBC: 5.2 10*3/uL (ref 4.5–10.5)

## 2012-10-05 NOTE — Patient Instructions (Addendum)
Your physician has recommended that you have an ablation. Catheter ablation is a medical procedure used to treat some cardiac arrhythmias (irregular heartbeats). During catheter ablation, a long, thin, flexible tube is put into a blood vessel in your groin (upper thigh), or neck. This tube is called an ablation catheter. It is then guided to your heart through the blood vessel. Radio frequency waves destroy small areas of heart tissue where abnormal heartbeats may cause an arrhythmia to start. Please see the instruction sheet given to you today.    Your physician has requested that you have cardiac CT. Cardiac computed tomography (CT) is a painless test that uses an x-ray machine to take clear, detailed pictures of your heart. For further information please visit [email or web address] Please follow instruction sheet as given.

## 2012-10-05 NOTE — Assessment & Plan Note (Signed)
The patient has symptomatic paroxysmal atrial fibrillation.  He has failed medical therapy with flecainide and diltiazem.  He is anticoagulated with xarelto. Therapeutic strategies for afib including medicine and ablation were discussed in detail with the patient today.  We talked about options of higher dose flecainide, tikosyn, or multaq.  Risk, benefits, and alternatives to EP study and radiofrequency ablation for afib were also discussed in detail today. These risks include but are not limited to stroke, bleeding, vascular damage, tamponade, perforation, damage to the esophagus, lungs, and other structures, pulmonary vein stenosis, worsening renal function, and death. The patient understands these risk and wishes to proceed.  We will schedule the procedure at the next available time.  We will therefore schedule for ablation.  He will need TEE prior to the procedure.

## 2012-10-05 NOTE — Progress Notes (Signed)
 Primary Care Physician: WONG,FRANCIS PATRICK, MD Referring Physician:  Dr Crenshaw   Kevin Holmes is a 46 y.o. male with a h/o hypertension and paroxysmal atrial fibrillation who presents for Ep consultation.  He reports initially being diagnosed with atrial fibrillation 12/12 but feels that he has had similar symptoms for about 18 months.  He had an event monitor 9/13 which documented afib.  He reports increasing frequency and duration of atrial fibrillation with symptoms of palpitations, fatigue, and decreased exercise tolerance.  Episodes typically last 2-3 hours but have lasted up to 9 hours.  Episodes occur every 2-3 days.  He has experienced symptoms with heavy exertion but is typically without precipitating event and can occur at rest.  He tried flecainide 50mg BID without benefit.  He had a myoview which revealed inferior ischemia and therefore flecainide was discontinued.  He reports occasional "chest tightness" and is now concerned that his "stress test showed a blockage".  Today, he denies symptoms of shortness of breath, orthopnea, PND, lower extremity edema, dizziness, presyncope, syncope, or neurologic sequela. The patient is tolerating medications without difficulties and is otherwise without complaint today.   Past Medical History  Diagnosis Date  . Pure hypercholesterolemia   . Hypertension   . History of gallstones     LAPAROSCOPIC CHOLECYSTECTOMY 2005  . Paroxysmal atrial fibrillation    Past Surgical History  Procedure Date  . Laparoscopic cholecystectomy 07/04/2004 [facility]    DR. PAUL TOTH  . Laminectomy     Current Outpatient Prescriptions  Medication Sig Dispense Refill  . atorvastatin (LIPITOR) 10 MG tablet Take 10 mg by mouth daily.       . diltiazem (TIAZAC) 300 MG 24 hr capsule Take 300 mg by mouth daily.      . lisinopril (PRINIVIL,ZESTRIL) 20 MG tablet Take 20 mg by mouth daily.       . Rivaroxaban (XARELTO) 20 MG TABS Take 1 tablet (20 mg total) by mouth  daily.  30 tablet  6    No Known Allergies  History   Social History  . Marital Status: Single    Spouse Name: N/A    Number of Children: N/A  . Years of Education: N/A   Occupational History  . Not on file.   Social History Main Topics  . Smoking status: Never Smoker   . Smokeless tobacco: Not on file  . Alcohol Use: Yes     Comment: rare  . Drug Use: No  . Sexually Active: Not on file   Other Topics Concern  . Not on file   Social History Narrative   Lives in [place].  Works at [employer] as an engineer    Family History  Problem Relation Age of Onset  . Hypertension Father   . Heart attack Paternal Grandfather 64    ROS- All systems are reviewed and negative except as per the HPI above  Physical Exam: Filed Vitals:   10/05/12 1218  BP: 128/82  Pulse: 74  Height: 5' 10" (1.778 m)  Weight: 202 lb (91.627 kg)  SpO2: 98%    GEN- The patient is well appearing, alert and oriented x 3 today.   Head- normocephalic, atraumatic Eyes-  Sclera clear, conjunctiva pink Ears- hearing intact Oropharynx- clear Neck- supple, no JVP Lymph- no cervical lymphadenopathy Lungs- Clear to ausculation bilaterally, normal work of breathing Heart- Regular rate and rhythm, no murmurs, rubs or gallops, PMI not laterally displaced GI- soft, NT, ND, + BS Extremities- no clubbing, cyanosis, or   edema MS- no significant deformity or atrophy Skin- no rash or lesion Psych- euthymic mood, full affect Neuro- strength and sensation are intact  EKG today reveals sinus rhythm 74 bpm, otherwise normal ekg myoview reviewed as above Echo 08/03/12- EF 60-65%, no significant valvular disease, LA 42mm  Assessment and Plan:  

## 2012-10-05 NOTE — Assessment & Plan Note (Signed)
Stable No change required today  

## 2012-10-05 NOTE — Assessment & Plan Note (Signed)
He has occasional chest tightness.  Recent myoview was low risk but did reveal an inferior defect.  The patient is very concerned and wishes to have this further investigated.  Given his symptoms, I would recommend cardiac CT prior to his ablation.  This will allow adequate assessment of his coronary arteries without the need to interrupt anticoagulation prior to his ablation.  I will therefore schedule cardiac CT.  This will also be helpful in assessing his pulmonary veins prior to his ablation.

## 2012-10-07 ENCOUNTER — Encounter: Payer: Self-pay | Admitting: *Deleted

## 2012-10-09 ENCOUNTER — Encounter (HOSPITAL_COMMUNITY): Payer: Self-pay | Admitting: Respiratory Therapy

## 2012-10-12 ENCOUNTER — Telehealth: Payer: Self-pay | Admitting: Internal Medicine

## 2012-10-12 NOTE — Telephone Encounter (Signed)
plz return call to pt wife [phone] or [phone] cell  regarding Cardiac CT schedule.

## 2012-10-12 NOTE — Telephone Encounter (Signed)
Pending insurance pre-cert.  Pt made aware.

## 2012-10-13 ENCOUNTER — Encounter: Payer: Self-pay | Admitting: *Deleted

## 2012-10-13 NOTE — Telephone Encounter (Signed)
Kevin Holmes is scheduled for Cardiac CT 10/16/12 at 2 pm. Patient is aware.

## 2012-10-16 ENCOUNTER — Ambulatory Visit (HOSPITAL_COMMUNITY)
Admission: RE | Admit: 2012-10-16 | Discharge: 2012-10-16 | Disposition: A | Discharge status: Home / Self Care | Payer: BC Managed Care – PPO | Source: Ambulatory Visit | Attending: Internal Medicine | Admitting: Internal Medicine

## 2012-10-16 DIAGNOSIS — R9439 Abnormal result of other cardiovascular function study: Secondary | ICD-10-CM | POA: Insufficient documentation

## 2012-10-16 DIAGNOSIS — M47814 Spondylosis without myelopathy or radiculopathy, thoracic region: Secondary | ICD-10-CM | POA: Insufficient documentation

## 2012-10-16 DIAGNOSIS — R079 Chest pain, unspecified: Secondary | ICD-10-CM

## 2012-10-16 MED ORDER — METOPROLOL TARTRATE 1 MG/ML IV SOLN
5.0000 mg | Freq: Once | INTRAVENOUS | Status: AC
  Administered 2012-10-16: 5 mg via INTRAVENOUS
  Filled 2012-10-16: qty 5

## 2012-10-16 MED ORDER — IOHEXOL 350 MG/ML SOLN
80.0000 mL | Freq: Once | INTRAVENOUS | Status: AC | PRN
  Administered 2012-10-16: 80 mL via INTRAVENOUS

## 2012-10-16 MED ORDER — NITROGLYCERIN 0.4 MG SL SUBL
SUBLINGUAL_TABLET | SUBLINGUAL | Status: AC
  Filled 2012-10-16: qty 25

## 2012-10-16 MED ORDER — NITROGLYCERIN 0.4 MG SL SUBL
0.4000 mg | SUBLINGUAL_TABLET | Freq: Once | SUBLINGUAL | Status: AC
  Administered 2012-10-16: 0.4 mg via SUBLINGUAL
  Filled 2012-10-16: qty 25

## 2012-10-16 MED ORDER — METOPROLOL TARTRATE 1 MG/ML IV SOLN
INTRAVENOUS | Status: AC
  Filled 2012-10-16: qty 15

## 2012-10-19 ENCOUNTER — Encounter (HOSPITAL_COMMUNITY)
Admission: RE | Disposition: A | Discharge status: Home / Self Care | Payer: Self-pay | Source: Ambulatory Visit | Attending: Internal Medicine

## 2012-10-19 ENCOUNTER — Ambulatory Visit (HOSPITAL_COMMUNITY)
Admission: RE | Admit: 2012-10-19 | Discharge: 2012-10-19 | Disposition: A | Discharge status: Home / Self Care | Payer: BC Managed Care – PPO | Source: Ambulatory Visit | Attending: Internal Medicine | Admitting: Internal Medicine

## 2012-10-19 ENCOUNTER — Encounter (HOSPITAL_COMMUNITY): Payer: Self-pay

## 2012-10-19 DIAGNOSIS — I4891 Unspecified atrial fibrillation: Secondary | ICD-10-CM

## 2012-10-19 HISTORY — PX: TEE WITHOUT CARDIOVERSION: SHX5443

## 2012-10-19 SURGERY — ECHOCARDIOGRAM, TRANSESOPHAGEAL
Anesthesia: Moderate Sedation

## 2012-10-19 MED ORDER — FENTANYL CITRATE 0.05 MG/ML IJ SOLN
INTRAMUSCULAR | Status: DC | PRN
  Administered 2012-10-19 (×3): 25 ug via INTRAVENOUS

## 2012-10-19 MED ORDER — LIDOCAINE VISCOUS 2 % MT SOLN
OROMUCOSAL | Status: DC | PRN
  Administered 2012-10-19: 5 mL via OROMUCOSAL

## 2012-10-19 MED ORDER — MIDAZOLAM HCL 5 MG/ML IJ SOLN
INTRAMUSCULAR | Status: AC
  Filled 2012-10-19: qty 3

## 2012-10-19 MED ORDER — SODIUM CHLORIDE 0.9 % IV SOLN: INTRAVENOUS | Status: DC

## 2012-10-19 MED ORDER — FENTANYL CITRATE 0.05 MG/ML IJ SOLN
INTRAMUSCULAR | Status: AC
  Filled 2012-10-19: qty 2

## 2012-10-19 MED ORDER — DIPHENHYDRAMINE HCL 50 MG/ML IJ SOLN
INTRAMUSCULAR | Status: AC
  Filled 2012-10-19: qty 1

## 2012-10-19 MED ORDER — MIDAZOLAM HCL 10 MG/2ML IJ SOLN
INTRAMUSCULAR | Status: DC | PRN
  Administered 2012-10-19 (×4): 2 mg via INTRAVENOUS

## 2012-10-19 NOTE — Op Note (Signed)
Full report to follow 

## 2012-10-19 NOTE — Discharge Instructions (Signed)
Transesophageal Echocardiography A transesophageal echocardiogram (TEE) is a special type of test that produces images of the heart by sound waves (echocardiogram). This type of echocardiogram can obtain better images of the heart than a standard echocardiogram. A TEE is done by passing a flexible tube down the esophagus. The heart is located in front of the esophagus. Because the heart and esophagus are close to one another, your caregiver can take very clear, detailed pictures of the heart via ultrasound waves. WHY HAVE A TEE? Your caregiver may need more information based on your medical condition. A TEE is usually performed due to the following:  Your caregiver needs more information based on standard echocardiogram findings.  If you had a stroke, this might have happened because a clot formed in your heart. A TEE can visualize different areas of the heart and check for clots.  To check valve anatomy and function. Your caregiver will especially look at the mitral valve.  To check for redness, soreness, and swelling (inflammation) on the inside lining of the heart (endocarditis).  To evaluate the dividing wall (septum) of the heart and presence of a hole that did not close after birth (patent foramen ovale, PFO).  To help diagnose a tear in the wall of the aorta (aortic dissection).  During cardiac valve surgery, a TEE probe is placed. This allows the surgeon to assess the valve repair before closing the chest. LET YOUR CAREGIVER KNOW ABOUT:   Swallowing difficulties.  An esophageal obstruction.  Use of aspirin or antiplatelet therapy. RISKS AND COMPLICATIONS  Though extremely rare, an esophageal tear (rupture) is a potential complication. BEFORE THE PROCEDURE   Arrive at least 1 hour before the procedure or as told by your caregiver.  Do not eat or drink for 6 hours before the procedure or as told by your caregiver.  An intravenous (IV) access tube will be started in the  arm. PROCEDURE   A medicine to help you relax (sedative) will be given through the IV.  A medicine that numbs the area (local anesthetic) may be sprayed to the back of the throat.  Your blood pressure, heart rate, and breathing (vital signs) will be monitored during the procedure.  The TEE probe is a long, flexible tube. It is about the width of an adult male's index finger. The tip of the probe is placed into the back of the mouth and you will be asked to swallow. This helps to pass the tip of the probe into the esophagus. Once the tip of the probe is in the correct area, your caregiver can take pictures of the heart.  A TEE is usually not a painful procedure. You may feel the probe press against the back of the throat. The probe does not enter the trachea and does not affect your breathing.  Your time spent at the hospital is usually less than 2 hours. AFTER THE PROCEDURE   You will be in bed, resting until you have fully returned to consciousness.  When you first awaken, your throat may feel slightly sore and will probably still feel numb. This will improve slowly over time.  You will not be allowed to eat or drink until it is clear that numbness has improved.  Once you have been able to drink, urinate, and sit on the edge of the bed without feeling sick to your stomach (nauseous) or dizzy, you may be cleared to dress and go home.  Do not drive yourself home. You have had medications that  can continue to make you feel drowsy and can impair your reflexes.  You should have a friend or family member with you for the next 24 hours after your examination. Obtaining the test results It is your responsibility to obtain your test results. Ask the lab or department performing the test when and how you will get your results. SEEK IMMEDIATE MEDICAL CARE IF:   There is chest pain.  You have a hard time breathing or have shortness of breath.  You cough or throw up (vomit) blood. MAKE SURE  YOU:   Understand these instructions.  Will watch this condition.  Will get help right away if you is not doing well or gets worse. Document Released: 01/18/2003 Document Revised: 01/20/2012 Document Reviewed: 04/11/2009 [facility] Patient Information 2013 [place], [place].  Moderate Sedation, Adult Moderate sedation is given to help you relax or even sleep through a procedure. You may remain sleepy, be clumsy, or have poor balance for several hours following this procedure. Arrange for a responsible adult, family member, or friend to take you home. A responsible adult should stay with you for at least 24 hours or until the medicines have worn off.  Do not participate in any activities where you could become injured for the next 24 hours, or until you feel normal again. Do not:  Drive.  Swim.  Ride a bicycle.  Operate heavy machinery.  Cook.  Use power tools.  Climb ladders.  Work at [employer].  Do not make important decisions or sign legal documents until you are improved.  Vomiting may occur if you eat too soon. When you can drink without vomiting, try water, juice, or soup. Try solid foods if you feel little or no nausea.  Only take over-the-counter or prescription medications for pain, discomfort, or fever as directed by your caregiver.If pain medications have been prescribed for you, ask your caregiver how soon it is safe to take them.  Make sure you and your family fully understands everything about the medication given to you. Make sure you understand what side effects may occur.  You should not drink alcohol, take sleeping pills, or medications that cause drowsiness for at least 24 hours.  If you smoke, do not smoke alone.  If you are feeling better, you may resume normal activities 24 hours after receiving sedation.  Keep all appointments as scheduled. Follow all instructions.  Ask questions if you do not understand. SEEK MEDICAL CARE IF:   Your skin is pale or  bluish in color.  You continue to feel sick to your stomach (nauseous) or throw up (vomit).  Your pain is getting worse and not helped by medication.  You have bleeding or swelling.  You are still sleepy or feeling clumsy after 24 hours. SEEK IMMEDIATE MEDICAL CARE IF:   You develop a rash.  You have difficulty breathing.  You develop any type of allergic problem.  You have a fever. Document Released: 07/23/2001 Document Revised: 01/20/2012 Document Reviewed: 12/14/2007 [facility] Patient Information 2013 [place], [place].

## 2012-10-19 NOTE — H&P (View-Only) (Signed)
 Primary Care Physician: WONG,FRANCIS PATRICK, MD Referring Physician:  Dr Crenshaw   Kevin Holmes is a 46 y.o. male with a h/o hypertension and paroxysmal atrial fibrillation who presents for Ep consultation.  He reports initially being diagnosed with atrial fibrillation 12/12 but feels that he has had similar symptoms for about 18 months.  He had an event monitor 9/13 which documented afib.  He reports increasing frequency and duration of atrial fibrillation with symptoms of palpitations, fatigue, and decreased exercise tolerance.  Episodes typically last 2-3 hours but have lasted up to 9 hours.  Episodes occur every 2-3 days.  He has experienced symptoms with heavy exertion but is typically without precipitating event and can occur at rest.  He tried flecainide 50mg BID without benefit.  He had a myoview which revealed inferior ischemia and therefore flecainide was discontinued.  He reports occasional "chest tightness" and is now concerned that his "stress test showed a blockage".  Today, he denies symptoms of shortness of breath, orthopnea, PND, lower extremity edema, dizziness, presyncope, syncope, or neurologic sequela. The patient is tolerating medications without difficulties and is otherwise without complaint today.   Past Medical History  Diagnosis Date  . Pure hypercholesterolemia   . Hypertension   . History of gallstones     LAPAROSCOPIC CHOLECYSTECTOMY 2005  . Paroxysmal atrial fibrillation    Past Surgical History  Procedure Date  . Laparoscopic cholecystectomy 07/04/2004 [facility]    DR. PAUL TOTH  . Laminectomy     Current Outpatient Prescriptions  Medication Sig Dispense Refill  . atorvastatin (LIPITOR) 10 MG tablet Take 10 mg by mouth daily.       . diltiazem (TIAZAC) 300 MG 24 hr capsule Take 300 mg by mouth daily.      . lisinopril (PRINIVIL,ZESTRIL) 20 MG tablet Take 20 mg by mouth daily.       . Rivaroxaban (XARELTO) 20 MG TABS Take 1 tablet (20 mg total) by mouth  daily.  30 tablet  6    No Known Allergies  History   Social History  . Marital Status: Single    Spouse Name: N/A    Number of Children: N/A  . Years of Education: N/A   Occupational History  . Not on file.   Social History Main Topics  . Smoking status: Never Smoker   . Smokeless tobacco: Not on file  . Alcohol Use: Yes     Comment: rare  . Drug Use: No  . Sexually Active: Not on file   Other Topics Concern  . Not on file   Social History Narrative   Lives in [place].  Works at [employer] as an engineer    Family History  Problem Relation Age of Onset  . Hypertension Father   . Heart attack Paternal Grandfather 64    ROS- All systems are reviewed and negative except as per the HPI above  Physical Exam: Filed Vitals:   10/05/12 1218  BP: 128/82  Pulse: 74  Height: 5' 10" (1.778 m)  Weight: 202 lb (91.627 kg)  SpO2: 98%    GEN- The patient is well appearing, alert and oriented x 3 today.   Head- normocephalic, atraumatic Eyes-  Sclera clear, conjunctiva pink Ears- hearing intact Oropharynx- clear Neck- supple, no JVP Lymph- no cervical lymphadenopathy Lungs- Clear to ausculation bilaterally, normal work of breathing Heart- Regular rate and rhythm, no murmurs, rubs or gallops, PMI not laterally displaced GI- soft, NT, ND, + BS Extremities- no clubbing, cyanosis, or   edema MS- no significant deformity or atrophy Skin- no rash or lesion Psych- euthymic mood, full affect Neuro- strength and sensation are intact  EKG today reveals sinus rhythm 74 bpm, otherwise normal ekg myoview reviewed as above Echo 08/03/12- EF 60-65%, no significant valvular disease, LA 42mm  Assessment and Plan:  

## 2012-10-19 NOTE — Interval H&P Note (Signed)
History and Physical Interval Note:  10/19/2012 11:44 AM  Kevin Holmes  has presented today for surgery, with the diagnosis of a-fib  The various methods of treatment have been discussed with the patient and family. After consideration of risks, benefits and other options for treatment, the patient has consented to  Procedure(s) (LRB) with comments: TRANSESOPHAGEAL ECHOCARDIOGRAM (TEE) (N/A) as a surgical intervention .  The patient's history has been reviewed, patient examined, no change in status, stable for surgery.  I have reviewed the patient's chart and labs.  Questions were answered to the patient's satisfaction.     [name]

## 2012-10-19 NOTE — Progress Notes (Signed)
  Echocardiogram Echocardiogram Transesophageal has been performed.  Kevin Holmes 10/19/2012, 2:36 PM

## 2012-10-20 ENCOUNTER — Ambulatory Visit (HOSPITAL_COMMUNITY)
Admission: RE | Admit: 2012-10-20 | Discharge: 2012-10-21 | Disposition: A | Discharge status: Home / Self Care | Payer: BC Managed Care – PPO | Source: Ambulatory Visit | Attending: Internal Medicine | Admitting: Internal Medicine

## 2012-10-20 ENCOUNTER — Encounter (HOSPITAL_COMMUNITY): Payer: Self-pay

## 2012-10-20 ENCOUNTER — Encounter (HOSPITAL_COMMUNITY)
Admission: RE | Disposition: A | Discharge status: Home / Self Care | Payer: Self-pay | Source: Ambulatory Visit | Attending: Internal Medicine

## 2012-10-20 ENCOUNTER — Ambulatory Visit (HOSPITAL_COMMUNITY): Payer: BC Managed Care – PPO | Admitting: Anesthesiology

## 2012-10-20 ENCOUNTER — Encounter (HOSPITAL_COMMUNITY): Payer: Self-pay | Admitting: Anesthesiology

## 2012-10-20 DIAGNOSIS — I4891 Unspecified atrial fibrillation: Secondary | ICD-10-CM | POA: Diagnosis present

## 2012-10-20 DIAGNOSIS — Z7901 Long term (current) use of anticoagulants: Secondary | ICD-10-CM | POA: Insufficient documentation

## 2012-10-20 DIAGNOSIS — E7800 Pure hypercholesterolemia, unspecified: Secondary | ICD-10-CM | POA: Insufficient documentation

## 2012-10-20 DIAGNOSIS — I10 Essential (primary) hypertension: Secondary | ICD-10-CM | POA: Insufficient documentation

## 2012-10-20 HISTORY — PX: ATRIAL FIBRILLATION ABLATION: SHX5456

## 2012-10-20 LAB — POCT ACTIVATED CLOTTING TIME
Activated Clotting Time: 294 seconds
Activated Clotting Time: 404 seconds

## 2012-10-20 SURGERY — ATRIAL FIBRILLATION ABLATION
Anesthesia: General

## 2012-10-20 MED ORDER — PROMETHAZINE HCL 25 MG/ML IJ SOLN: 6.2500 mg | INTRAMUSCULAR | Status: DC | PRN

## 2012-10-20 MED ORDER — PHENYLEPHRINE HCL 10 MG/ML IJ SOLN
INTRAMUSCULAR | Status: DC | PRN
  Administered 2012-10-20 (×2): 40 ug via INTRAVENOUS

## 2012-10-20 MED ORDER — HYDROXYUREA 500 MG PO CAPS
ORAL_CAPSULE | ORAL | Status: AC
  Filled 2012-10-20: qty 1

## 2012-10-20 MED ORDER — HYDROCODONE-ACETAMINOPHEN 5-325 MG PO TABS
1.0000 | ORAL_TABLET | ORAL | Status: DC | PRN
  Administered 2012-10-20 – 2012-10-21 (×4): 2 via ORAL
  Filled 2012-10-20 (×4): qty 2

## 2012-10-20 MED ORDER — SODIUM CHLORIDE 0.9 % IJ SOLN
3.0000 mL | Freq: Two times a day (BID) | INTRAMUSCULAR | Status: DC
  Administered 2012-10-20: 3 mL via INTRAVENOUS

## 2012-10-20 MED ORDER — ATORVASTATIN CALCIUM 10 MG PO TABS
10.0000 mg | ORAL_TABLET | Freq: Every day | ORAL | Status: DC
  Filled 2012-10-20: qty 1

## 2012-10-20 MED ORDER — HEPARIN SODIUM (PORCINE) 1000 UNIT/ML IJ SOLN
INTRAMUSCULAR | Status: AC
  Filled 2012-10-20: qty 1

## 2012-10-20 MED ORDER — LISINOPRIL 20 MG PO TABS
20.0000 mg | ORAL_TABLET | Freq: Every day | ORAL | Status: DC
  Filled 2012-10-20: qty 1

## 2012-10-20 MED ORDER — RIVAROXABAN 20 MG PO TABS
20.0000 mg | ORAL_TABLET | Freq: Every day | ORAL | Status: DC
  Administered 2012-10-20: 20 mg via ORAL
  Filled 2012-10-20 (×2): qty 1

## 2012-10-20 MED ORDER — DEXTROSE 5 % IV SOLN
INTRAVENOUS | Status: AC
  Filled 2012-10-20: qty 250

## 2012-10-20 MED ORDER — ARTIFICIAL TEARS OP OINT
TOPICAL_OINTMENT | OPHTHALMIC | Status: DC | PRN
  Administered 2012-10-20: 1 via OPHTHALMIC

## 2012-10-20 MED ORDER — FENTANYL CITRATE 0.05 MG/ML IJ SOLN
INTRAMUSCULAR | Status: DC | PRN
  Administered 2012-10-20: 50 ug via INTRAVENOUS
  Administered 2012-10-20: 25 ug via INTRAVENOUS
  Administered 2012-10-20 (×2): 50 ug via INTRAVENOUS
  Administered 2012-10-20: 25 ug via INTRAVENOUS
  Administered 2012-10-20: 50 ug via INTRAVENOUS

## 2012-10-20 MED ORDER — SODIUM CHLORIDE 0.9 % IV SOLN
INTRAVENOUS | Status: DC | PRN
  Administered 2012-10-20 (×2): via INTRAVENOUS

## 2012-10-20 MED ORDER — MEPERIDINE HCL 25 MG/ML IJ SOLN: 6.2500 mg | INTRAMUSCULAR | Status: DC | PRN

## 2012-10-20 MED ORDER — OXYCODONE HCL 5 MG/5ML PO SOLN: 5.0000 mg | Freq: Once | ORAL | Status: DC | PRN

## 2012-10-20 MED ORDER — ISOPROTERENOL HCL 0.2 MG/ML IJ SOLN
1000.0000 ug | INTRAVENOUS | Status: DC | PRN
  Administered 2012-10-20: 20 ug/min via INTRAVENOUS

## 2012-10-20 MED ORDER — MIDAZOLAM HCL 2 MG/2ML IJ SOLN: 0.5000 mg | Freq: Once | INTRAMUSCULAR | Status: DC | PRN

## 2012-10-20 MED ORDER — PROPOFOL 10 MG/ML IV BOLUS
INTRAVENOUS | Status: DC | PRN
  Administered 2012-10-20: 30 mg via INTRAVENOUS
  Administered 2012-10-20: 200 mg via INTRAVENOUS
  Administered 2012-10-20: 30 mg via INTRAVENOUS

## 2012-10-20 MED ORDER — ONDANSETRON HCL 4 MG/2ML IJ SOLN: 4.0000 mg | Freq: Four times a day (QID) | INTRAMUSCULAR | Status: DC | PRN

## 2012-10-20 MED ORDER — MIDAZOLAM HCL 5 MG/5ML IJ SOLN
INTRAMUSCULAR | Status: DC | PRN
  Administered 2012-10-20: 2 mg via INTRAVENOUS

## 2012-10-20 MED ORDER — DILTIAZEM HCL ER BEADS 300 MG PO CP24
300.0000 mg | ORAL_CAPSULE | Freq: Every day | ORAL | Status: DC
  Administered 2012-10-20: 300 mg via ORAL
  Filled 2012-10-20 (×2): qty 1

## 2012-10-20 MED ORDER — OXYCODONE HCL 5 MG PO TABS: 5.0000 mg | ORAL_TABLET | Freq: Once | ORAL | Status: DC | PRN

## 2012-10-20 MED ORDER — SODIUM CHLORIDE 0.9 % IV SOLN: INTRAVENOUS | Status: DC

## 2012-10-20 MED ORDER — EPHEDRINE SULFATE 50 MG/ML IJ SOLN
INTRAMUSCULAR | Status: DC | PRN
  Administered 2012-10-20: 5 mg via INTRAVENOUS

## 2012-10-20 MED ORDER — HEPARIN SODIUM (PORCINE) 1000 UNIT/ML IJ SOLN
INTRAMUSCULAR | Status: DC | PRN
  Administered 2012-10-20: 10000 [IU] via INTRAVENOUS
  Administered 2012-10-20: 4000 [IU] via INTRAVENOUS
  Administered 2012-10-20: 5000 [IU] via INTRAVENOUS

## 2012-10-20 MED ORDER — PROTAMINE SULFATE 10 MG/ML IV SOLN
INTRAVENOUS | Status: DC | PRN
  Administered 2012-10-20: 10 mg via INTRAVENOUS
  Administered 2012-10-20: 30 mg via INTRAVENOUS

## 2012-10-20 MED ORDER — FENTANYL CITRATE 0.05 MG/ML IJ SOLN: 25.0000 ug | INTRAMUSCULAR | Status: DC | PRN

## 2012-10-20 MED ORDER — SODIUM CHLORIDE 0.9 % IJ SOLN: 3.0000 mL | INTRAMUSCULAR | Status: DC | PRN

## 2012-10-20 MED ORDER — LIDOCAINE HCL (CARDIAC) 20 MG/ML IV SOLN
INTRAVENOUS | Status: DC | PRN
  Administered 2012-10-20: 20 mg via INTRAVENOUS

## 2012-10-20 MED ORDER — ONDANSETRON HCL 4 MG/2ML IJ SOLN
INTRAMUSCULAR | Status: DC | PRN
  Administered 2012-10-20: 4 mg via INTRAVENOUS

## 2012-10-20 MED ORDER — SODIUM CHLORIDE 0.9 % IV SOLN: 250.0000 mL | INTRAVENOUS | Status: DC | PRN

## 2012-10-20 MED ORDER — BUPIVACAINE HCL (PF) 0.25 % IJ SOLN
INTRAMUSCULAR | Status: AC
  Filled 2012-10-20: qty 30

## 2012-10-20 MED ORDER — ACETAMINOPHEN 325 MG PO TABS: 650.0000 mg | ORAL_TABLET | ORAL | Status: DC | PRN

## 2012-10-20 NOTE — Interval H&P Note (Signed)
History and Physical Interval Note:  10/20/2012 10:40 AM  Kevin Holmes  has presented today for surgery, with the diagnosis of Afib  The various methods of treatment have been discussed with the patient and family. After consideration of risks, benefits and other options for treatment, the patient has consented to  Procedure(s) (LRB) with comments: ATRIAL FIBRILLATION ABLATION (N/A) as a surgical intervention .  The patient's history has been reviewed, patient examined, no change in status, stable for surgery.  I have reviewed the patient's chart and labs.  Questions were answered to the patient's satisfaction.     Hillis Range

## 2012-10-20 NOTE — Anesthesia Procedure Notes (Signed)
Procedure Name: LMA Insertion Date/Time: 10/20/2012 10:46 AM Performed by: Romie Minus K Pre-anesthesia Checklist: Patient identified, Emergency Drugs available, Suction available, Timeout performed and Patient being monitored Patient Re-evaluated:Patient Re-evaluated prior to inductionOxygen Delivery Method: Circle system utilized Preoxygenation: Pre-oxygenation with 100% oxygen Intubation Type: IV induction LMA: LMA with gastric port inserted LMA Size: 4.0 Number of attempts: 1 Placement Confirmation: positive ETCO2,  CO2 detector and breath sounds checked- equal and bilateral Tube secured with: Tape Dental Injury: Teeth and Oropharynx as per pre-operative assessment

## 2012-10-20 NOTE — Transfer of Care (Signed)
Immediate Anesthesia Transfer of Care Note  Patient: Kevin Holmes  Procedure(s) Performed: Procedure(s) (LRB) with comments: ATRIAL FIBRILLATION ABLATION (N/A)  Patient Location: Cath Lab  Anesthesia Type:General  Level of Consciousness: awake, alert , oriented and patient cooperative  Airway & Oxygen Therapy: Patient Spontanous Breathing and Patient connected to nasal cannula oxygen  Post-op Assessment: Report given to PACU RN and Post -op Vital signs reviewed and stable  Post vital signs: Reviewed and stable  Complications: No apparent anesthesia complications

## 2012-10-20 NOTE — Preoperative (Signed)
Beta Blockers   Reason not to administer Beta Blockers:Not Applicable 

## 2012-10-20 NOTE — H&P (View-Only) (Signed)
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  He reports initially being diagnosed with atrial fibrillation 12/12 but feels that he has had similar symptoms for about 18 months.  He had an event monitor 9/13 which documented afib.  He reports increasing frequency and duration of atrial fibrillation with symptoms of palpitations, fatigue, and decreased exercise tolerance.  Episodes typically last 2-3 hours but have lasted up to 9 hours.  Episodes occur every 2-3 days.  He has experienced symptoms with heavy exertion but is typically without precipitating event and can occur at rest.  He tried flecainide 50mg  BID without benefit.  He had a myoview which revealed inferior ischemia and therefore flecainide was discontinued.  He reports occasional "chest tightness" and is now concerned that his "stress test showed a blockage".  Today, he denies symptoms of shortness of breath, orthopnea, PND, lower extremity edema, dizziness, presyncope, syncope, or neurologic sequela. The patient is tolerating medications without difficulties and is otherwise without complaint today.   Past Medical History  Diagnosis Date  . Pure hypercholesterolemia   . Hypertension   . History of gallstones     LAPAROSCOPIC CHOLECYSTECTOMY 2005  . Paroxysmal atrial fibrillation    Past Surgical History  Procedure Date  . Laparoscopic cholecystectomy 07/04/2004 [facility]    DR. PAUL TOTH  . Laminectomy     Current Outpatient Prescriptions  Medication Sig Dispense Refill  . atorvastatin (LIPITOR) 10 MG tablet Take 10 mg by mouth daily.       Marland Kitchen diltiazem (TIAZAC) 300 MG 24 hr capsule Take 300 mg by mouth daily.      Marland Kitchen lisinopril (PRINIVIL,ZESTRIL) 20 MG tablet Take 20 mg by mouth daily.       . Rivaroxaban (XARELTO) 20 MG TABS Take 1 tablet (20 mg total) by mouth  daily.  30 tablet  6    No Known Allergies  History   Social History  . Marital Status: Single    Spouse Name: N/A    Number of Children: N/A  . Years of Education: N/A   Occupational History  . Not on file.   Social History Main Topics  . Smoking status: Never Smoker   . Smokeless tobacco: Not on file  . Alcohol Use: Yes     Comment: rare  . Drug Use: No  . Sexually Active: Not on file   Other Topics Concern  . Not on file   Social History Narrative   Lives in [place].  Works at [employer] as an Art gallery manager    Family History  Problem Relation Age of Onset  . Hypertension Father   . Heart attack Paternal Grandfather 9    ROS- All systems are reviewed and negative except as per the HPI above  Physical Exam: Filed Vitals:   10/05/12 1218  BP: 128/82  Pulse: 74  Height: 5\' 10"  (1.778 m)  Weight: 202 lb (91.627 kg)  SpO2: 98%    GEN- The patient is well appearing, alert and oriented x 3 today.   Head- normocephalic, atraumatic Eyes-  Sclera clear, conjunctiva pink Ears- hearing intact Oropharynx- clear Neck- supple, no JVP Lymph- no cervical lymphadenopathy Lungs- Clear to ausculation bilaterally, normal work of breathing Heart- Regular rate and rhythm, no murmurs, rubs or gallops, PMI not laterally displaced GI- soft, NT, ND, + BS Extremities- no clubbing, cyanosis, or  edema MS- no significant deformity or atrophy Skin- no rash or lesion Psych- euthymic mood, full affect Neuro- strength and sensation are intact  EKG today reveals sinus rhythm 74 bpm, otherwise normal ekg myoview reviewed as above Echo 08/03/12- EF 60-65%, no significant valvular disease, LA 42mm  Assessment and Plan:

## 2012-10-20 NOTE — Anesthesia Postprocedure Evaluation (Signed)
  Anesthesia Post-op Note  Patient: Kevin Holmes  Procedure(s) Performed: Procedure(s) (LRB) with comments: ATRIAL FIBRILLATION ABLATION (N/A)  Patient Location: Cath Lab  Anesthesia Type:General  Level of Consciousness: awake  Airway and Oxygen Therapy: Patient Spontanous Breathing  Post-op Pain: none  Post-op Assessment: Post-op Vital signs reviewed  Post-op Vital Signs: stable  Complications: No apparent anesthesia complications

## 2012-10-20 NOTE — Plan of Care (Signed)
Problem: Consults Goal: Atrial Arhythmia Patient Education (See Patient Education module for education specifics.) Outcome: Progressing Atrial fibrillation ablation done today and was successful.  Pt is now in NSR

## 2012-10-20 NOTE — Op Note (Addendum)
SURGEON:  Hillis Range, MD  PREPROCEDURE DIAGNOSES: 1. Paroxysmal atrial fibrillation.  POSTPROCEDURE DIAGNOSES: 1. Paroxysmal  atrial fibrillation.  PROCEDURES: 1. Comprehensive electrophysiologic study. 2. Coronary sinus pacing and recording. 3. Three-dimensional mapping of atrial fibrillation 4. Ablation of atrial fibrillation 5. Intracardiac echocardiography. 6. Transseptal puncture of an intact septum. 7. Pulmonary vein venography 8. Arrhythmia induction with pacing with isuprel infusion  INTRODUCTION:  Kevin Holmes is a 46 y.o. male with a history of paroxysmal atrial fibrillation who now presents for EP study and radiofrequency ablation.  The patient reports initially being diagnosed with atrial fibrillation after presenting with symptomatic palpitations and fatgiue. The patient reports increasing frequency and duration of atrial fibrillation since that time.  He has failed medical therapy with flecainide and diltiazem.  The patient therefore presents today for catheter ablation of atrial fibrillation.  DESCRIPTION OF PROCEDURE:  Informed written consent was obtained, and the patient was brought to the electrophysiology lab in a fasting state.  The patient was adequately sedated with intravenous medications as outlined in the anesthesia report.  The patient's left and right groins were prepped and draped in the usual sterile fashion by the EP lab staff.  Using a percutaneous Seldinger technique, two 7-French and one 11-French hemostasis sheaths were placed into the right common femoral vein.    Catheter Placement:  A 7-French Biosense Webster Decapolar coronary sinus catheter was introduced through the right common femoral vein and advanced into the coronary sinus for recording and pacing from this location.  A 6-French quadripolar Josephson catheter was introduced through the right common femoral vein and advanced into the right ventricle for recording and pacing.  This catheter was  then pulled back to the His bundle location.    Initial Measurements: The patient presented to the electrophysiology lab in sinus rhythm.  His PR interval measured 172 msec with a QRS duringation of 89 msec and a QT interval of 454 msec.  The AH interval measured 74 msec and the HV interval measured 37 msec.   Ventricular pacing was performed which revealed VA dissociation when pacing at a cycle length of 700 msec.  Intracardiac Echocardiography: A 10-French Biosense Webster AcuNav intracardiac echocardiography catheter was introduced through the left common femoral vein and advanced into the right atrium. Intracardiac echocardiography was performed of the left atrium, and a three-dimensional anatomical rendering of the left atrium was performed using CARTO sound technology.  The patient was noted to have a moderate sized left atrium.  The interatrial septum was prominent but not aneurysmal. All 4 pulmonary veins were visualized and noted to have separate ostia.  The pulmonary veins were moderate in size.  The left atrial appendage was visualized and did not reveal thrombus.   There was no evidence of pulmonary vein stenosis.   Transseptal Puncture: The middle right common femoral vein sheath was exchanged for an 8.5 [place] SL2 transseptal sheath and transseptal access was achieved in a standard fashion using a Brockenbrough needle under biplane fluoroscopy with intracardiac echocardiography confirmation of the transseptal puncture.  Once transseptal access had been achieved, heparin was administered intravenously and intra- arterially in order to maintain an ACT of greater than 300 seconds throughout the procedure.   Pulmonary Venography: A 6-French multipurpose angiographic catheter with guidewire was introduced through the transseptal sheath and positioned over the mouth of all 4 pulmonary veins.  Pulmonary venograms were performed by hand injection of nonionic contrast and demonstrated moderate-sized  pulmonary veins (approximately 20 mm in size).  There was  no evidence of pulmonary vein stenosis within any of the pulmonary veins.  The angiographic catheter was then removed.    3D Mapping and Ablation: The His bundle catheter was removed and in its place a 3.5 mm Biosense Webster EZ [employer] ablation catheter was advanced into the right atrium.  The transseptal sheath was pulled back into the IVC over a guidewire.  The ablation catheter was advanced across the transseptal hole using the wire as a guide.  The transseptal sheath was then re-advanced over the guidewire into the left atrium.  A duodecapolar Biosense Webster circular mapping catheter was introduced through the transseptal sheath and positioned over the mouth of all 4 pulmonary veins.  Three-dimensional electroanatomical mapping was performed using CARTO technology.  A previously rendered anatomic structure of the left atrium (prior cardiac CT) was merged into the map using CARTO [employer].  The patient was noted to have electrical activity within all four pulmonary veins at baseline. The patient underwent successful sequential electrical isolation and anatomical encircling of all four pulmonary veins using radiofrequency current with a circular mapping catheter as a guide.         Measurements Following Ablation: Following ablation, Isuprel was infused up to 20 mcg/min with no inducible atrial fibrillation, atrial tachycardia, atrial flutter, or sustained PACs. In sinus rhythm with RR interval was 771, with PR , QRS 91 msec, and Qtc 390 msec.  Following ablation the AH interval measured with an HV interval of 37 msec. Ventricular pacing was performed, which again revealed VA dissociation when pacing at 700 msec. Rapid atrial pacing was performed, which revealed an AV Wenckebach cycle length of 290 msec. Abbarant (left bundle branch block) conduction was noted when pacing at 280 msec.  Pacing was continued down  to a cycle length of 300 msec with no arrhythmias observed.  Electroisolation was then again confirmed in all four pulmonary veins.  The procedure was therefore considered completed.  All catheters were removed, and the sheaths were aspirated and flushed.  The patient was transferred to the recovery area for sheath removal per protocol.  A limited bedside transthoracic echocardiogram revealed no pericardial effusion.  There were no early apparent complications.  CONCLUSIONS: 1. Sinus rhythm upon presentation.   2. Pulmonary venography reveals four separate pulmonary veins without evidence of pulmonary vein stenosis. 3. Successful electrical isolation and anatomical encircling of all four pulmonary veins with radiofrequency current. 4. No inducible arrhythmias following ablation both on and off of Isuprel 5. No early apparent complications.   Ranger Petrich,MD 1:29 PM 10/20/2012

## 2012-10-20 NOTE — Anesthesia Preprocedure Evaluation (Addendum)
Anesthesia Evaluation  Patient identified by MRN, date of birth, ID band Patient awake    Reviewed: Allergy & Precautions, H&P , NPO status , Patient's Chart, lab work & pertinent test results  History of Anesthesia Complications Negative for: history of anesthetic complications  Airway Mallampati: II TM Distance: >3 FB Neck ROM: Full    Dental  (+) Teeth Intact and Dental Advisory Given   Pulmonary  Patient states he snores at night.  Sleep apnea? No daytime somnolence breath sounds clear to auscultation  Pulmonary exam normal       Cardiovascular hypertension, Pt. on medications + dysrhythmias Atrial Fibrillation Rhythm:Regular Rate:Normal  TEE ECHO 10/19/12 Normal LV function. Trace MR.  Cardiac CT 10/16/12 - negative   Neuro/Psych negative neurological ROS     GI/Hepatic negative GI ROS, Neg liver ROS,   Endo/Other  negative endocrine ROS  Renal/GU negative Renal ROS     Musculoskeletal   Abdominal   Peds  Hematology   Anesthesia Other Findings   Reproductive/Obstetrics                        Anesthesia Physical Anesthesia Plan  ASA: III  Anesthesia Plan: General   Post-op Pain Management:    Induction: Intravenous  Airway Management Planned: LMA  Additional Equipment:   Intra-op Plan:   Post-operative Plan:   Informed Consent: I have reviewed the patients History and Physical, chart, labs and discussed the procedure including the risks, benefits and alternatives for the proposed anesthesia with the patient or authorized representative who has indicated his/her understanding and acceptance.   Dental advisory given  Plan Discussed with: CRNA and Surgeon  Anesthesia Plan Comments: (Plan routine monitors, GA- LMA OK)        Anesthesia Quick Evaluation

## 2012-10-21 ENCOUNTER — Other Ambulatory Visit: Payer: Self-pay | Admitting: *Deleted

## 2012-10-21 DIAGNOSIS — R0683 Snoring: Secondary | ICD-10-CM

## 2012-10-21 DIAGNOSIS — I4891 Unspecified atrial fibrillation: Secondary | ICD-10-CM

## 2012-10-21 MED ORDER — PANTOPRAZOLE SODIUM 40 MG PO TBEC: 40.0000 mg | DELAYED_RELEASE_TABLET | Freq: Every day | ORAL | Status: DC

## 2012-10-21 NOTE — Progress Notes (Signed)
   ELECTROPHYSIOLOGY ROUNDING NOTE    Patient Name: Kevin Holmes Date of Encounter: 10-21-2012    SUBJECTIVE:Patient feels well.  No chest pain or shortness of breath.  Some back pain (chronic).  Some groin oozing last night.   S/p RFCA of afib 10-20-2012.   TELEMETRY: Reviewed telemetry pt in sinus rhythm Physical Exam: Filed Vitals:   10/20/12 1800 10/20/12 1900 10/20/12 1919 10/20/12 2336  BP: 125/75 123/72 123/72   Pulse: 82 72 78   Temp:   98.3 F (36.8 C) 98.8 F (37.1 C)  TempSrc:   Oral Oral  Resp: 14 13 14    Height:      Weight:      SpO2: 99% 98% 98%     GEN- The patient is well appearing, alert and oriented x 3 today.   Head- normocephalic, atraumatic Eyes-  Sclera clear, conjunctiva pink Ears- hearing intact Oropharynx- clear Neck- supple, no JVP Lungs- Clear to ausculation bilaterally, normal work of breathing Heart- Regular rate and rhythm, no murmurs, rubs or gallops, PMI not laterally displaced GI- soft, NT, ND, + BS Extremities- no clubbing, cyanosis, or edema, no hematoma/ bruit Neuro- strength and sensation are intact   Assessment and Plan:  1. afib Doing well s/p ablation Continue current medicine Add PPI x 6 weeks  2. Snoring Pt reports having frequent snoring and apnea.  Will need outpatient sleep study  Wound care, restrictions reviewed with patient.  Routine follow up scheduled.

## 2012-10-21 NOTE — Discharge Summary (Signed)
ELECTROPHYSIOLOGY PROCEDURE DISCHARGE SUMMARY    Patient ID: Kevin Holmes,  MRN: [id number], DOB/AGE: 1966-04-30 46 y.o.  Admit date: 10/20/2012 Discharge date: 10/21/2012  Primary Care Physician: Leodis Sias, MD Primary Cardiologist: Olga Millers, MD Electrophysiologist: Hillis Range, MD  Primary Discharge Diagnosis:  Atrial fibrillation status post ablation this admission  Secondary Discharge Diagnosis:  1.  Hypertension 2.  Hypercholesterolemia 3.  History of gallstones-- status post lap chole 2005 4.  Anticoagulation with Xarelto  Procedures This Admission: 1.  Electrophysiology study and radiofrequency catheter ablation on 10-20-2012 by Dr Johney Frame. This demonstrated sinus rhythm upon presentation, no evidence of pulmonary vein stenosis, successful electrical isolation and anatomical encircling of all four pulmonary veins with radiofrequency current.  There were no inducible arrhythmias following ablation and no early apparent complications.   Brief HPI: Kevin Holmes is a 46 y.o. male with a h/o hypertension and paroxysmal atrial fibrillation who was referred for Ep consultation. He reports initially being diagnosed with atrial fibrillation 12/12 but feels that he has had similar symptoms for about 18 months. He had an event monitor 9/13 which documented afib. He reports increasing frequency and duration of atrial fibrillation with symptoms of palpitations, fatigue, and decreased exercise tolerance. Episodes typically last 2-3 hours but have lasted up to 9 hours. Episodes occur every 2-3 days. He has experienced symptoms with heavy exertion but is typically without precipitating event and can occur at rest. He tried flecainide 50mg  BID without benefit. He had a myoview which revealed inferior ischemia and therefore flecainide was discontinued. He reports occasional "chest tightness" and is now concerned that his "stress test showed a blockage".  The patient has symptomatic  paroxysmal atrial fibrillation. He has failed medical therapy with flecainide and diltiazem. He is anticoagulated with xarelto.   Therapeutic strategies for afib including medicine and ablation were discussed in detail with the patient today. Dr Johney Frame discussed options of higher dose flecainide, tikosyn, or multaq. Risk, benefits, and alternatives to EP study and radiofrequency ablation for afib were also discussed. These risks include but are not limited to stroke, bleeding, vascular damage, tamponade, perforation, damage to the esophagus, lungs, and other structures, pulmonary vein stenosis, worsening renal function, and death. The patient understands these risk and wishes to proceed.   Hospital Course:  The patient was admitted and underwent electrophysiology study and radiofrequency catheter ablation on 10-20-2012 with details as outlined above.  He was monitored on telemetry overnight which demonstrated sinus rhythm.  His groin was without complication.  Dr Johney Frame examined the patient and considered him stable for discharge to home.  The patient is to continue on Xarelto and will follow up with Dr Johney Frame in 12 weeks.  PPI was added to medications at discharge.   Discharge Vitals: Blood pressure 103/60, pulse 68, temperature 98.8 F (37.1 C), temperature source Oral, resp. rate 12, height 5' 9.5" (1.765 m), weight 200 lb 9.9 oz (91 kg), SpO2 97.00%.   Labs:   Lab Results  Component Value Date   WBC 5.2 10/05/2012   HGB 13.6 10/05/2012   HCT 41.3 10/05/2012   MCV 91.1 10/05/2012   PLT 196.0 10/05/2012     Discharge Medications:    Medication List     As of 10/21/2012  9:57 AM    TAKE these medications         atorvastatin 10 MG tablet   Commonly known as: LIPITOR   Take 10 mg by mouth daily.      diltiazem 300  MG 24 hr capsule   Commonly known as: TIAZAC   Take 300 mg by mouth daily.      lisinopril 20 MG tablet   Commonly known as: PRINIVIL,ZESTRIL   Take 20 mg by mouth  daily.      pantoprazole 40 MG tablet   Commonly known as: PROTONIX   Take 1 tablet (40 mg total) by mouth daily.      Rivaroxaban 20 MG Tabs   Commonly known as: XARELTO   Take 1 tablet (20 mg total) by mouth daily.        Disposition:      Discharge Orders    Future Appointments: Provider: Department: Dept Phone: Center:   01/08/2013 4:15 PM Lewayne Bunting, MD [facility] [place]) [phone] [facility]   01/21/2013 9:30 AM Hillis Range, MD [facility] Main Office [place]) [phone] [facility]     Future Orders Please Complete By Expires   Diet - low sodium heart healthy      Increase activity slowly        Follow-up Information    Follow up with Hillis Range, MD. On 01/21/2013. (At 9:30 AM)    Contact information:   [address] [place] [address] [phone]         Duration of Discharge Encounter: Greater than 30 minutes including physician time.   Hillis Range, MD

## 2012-10-21 NOTE — Progress Notes (Signed)
Patient discharge education was completed. Patient was discharged home with wife.  Escorted to car via wheelchair with RN.

## 2012-11-15 ENCOUNTER — Ambulatory Visit (HOSPITAL_BASED_OUTPATIENT_CLINIC_OR_DEPARTMENT_OTHER): Payer: BC Managed Care – PPO | Attending: Internal Medicine | Admitting: Radiology

## 2012-11-15 VITALS — Ht 70.0 in | Wt 205.0 lb

## 2012-11-15 DIAGNOSIS — R0989 Other specified symptoms and signs involving the circulatory and respiratory systems: Secondary | ICD-10-CM | POA: Insufficient documentation

## 2012-11-15 DIAGNOSIS — I4891 Unspecified atrial fibrillation: Secondary | ICD-10-CM

## 2012-11-15 DIAGNOSIS — R0683 Snoring: Secondary | ICD-10-CM

## 2012-11-15 DIAGNOSIS — R0609 Other forms of dyspnea: Secondary | ICD-10-CM | POA: Insufficient documentation

## 2012-11-15 DIAGNOSIS — G4733 Obstructive sleep apnea (adult) (pediatric): Secondary | ICD-10-CM

## 2012-11-25 DIAGNOSIS — G4710 Hypersomnia, unspecified: Secondary | ICD-10-CM

## 2012-11-25 DIAGNOSIS — G4730 Sleep apnea, unspecified: Secondary | ICD-10-CM

## 2012-11-25 NOTE — Procedures (Cosign Needed)
NAME:  Kevin Holmes, Kevin Holmes NO.:  [id number]  MEDICAL RECORD NO.:  [id number]          PATIENT TYPE:  OUT  LOCATION:  SLEEP CENTER                 FACILITY:  [facility]  PHYSICIAN:  Barbaraann Share, MD,FCCPDATE OF BIRTH:  03-Sep-1964  DATE OF STUDY:  11/15/2012                           NOCTURNAL POLYSOMNOGRAM  REFERRING PHYSICIAN:  Hillis Range, MD  INDICATION FOR STUDY:  Hypersomnia with sleep apnea.  EPWORTH SLEEPINESS SCORE:  10.  MEDICATIONS:  SLEEP ARCHITECTURE:  The patient had a total sleep time of 344 minutes with very little slow-wave sleep and only 78 minutes of REM.  Sleep onset latency was normal at 17 minutes and REM onset was normal at 81 minutes.  Sleep efficiency was adequate at 91%.  RESPIRATORY DATA:  The patient was found to have 25 obstructive apneas and 56 obstructive hypopneas, giving him an apnea-hypopnea index of 14 events per hour.  The events were more common during REM, and in the supine sleep position, and there was loud snoring noted throughout.  The patient did not meet split-night protocol secondary to the majority of his events occurring well after 3 a.m.  OXYGEN DATA:  There was O2 desaturation as low as 86% with the patient's obstructive events.  CARDIAC DATA:  No clinically significant arrhythmias were noted.  MOVEMENT-PARASOMNIA:  The patient had no significant leg jerks or other abnormal behaviors seen.  IMPRESSIONS-RECOMMENDATIONS:  Mild obstructive sleep apnea/hypopnea syndrome, with an AHI of 14 events per hour and oxygen desaturation as low as 86%.  The patient did not meet split-night protocol secondary to the majority of his events occurring after 3 a.m.  Treatment for this degree of sleep apnea can include a trial of weight loss alone, upper airway surgery, dental appliance, and also CPAP.  Clinical correlation is suggested.     Barbaraann Share, MD,FCCP Diplomate, American Board of Sleep Medicine   [facility]/MED[facility]  D:   11/25/2012 08:33:39  T:  11/25/2012 08:43:52  Job:  [id number]

## 2012-12-02 ENCOUNTER — Telehealth: Payer: Self-pay | Admitting: Internal Medicine

## 2012-12-02 NOTE — Telephone Encounter (Signed)
Spoke with pt about sleep study results done 11/15/12. Pt is aware I am going to forward this to Dr Johney Frame for review and recommendations.  The result is in Epic under notes tab.

## 2012-12-02 NOTE — Telephone Encounter (Signed)
Pt would like results of sleep study

## 2012-12-06 NOTE — Telephone Encounter (Signed)
Mild sleep apnea Weight loss is advised

## 2012-12-08 NOTE — Telephone Encounter (Signed)
Lmom for patient for weight loss and to call me back with further questions

## 2013-01-08 ENCOUNTER — Encounter: Payer: BC Managed Care – PPO | Admitting: Cardiology

## 2013-01-08 ENCOUNTER — Encounter: Payer: Self-pay | Admitting: *Deleted

## 2013-01-08 NOTE — Progress Notes (Signed)
   HPI: Pleasant gentleman for fu of atrial fibrillation. Patient previously had a monitor for recurrent palpitations that showed paroxysmal atrial fibrillation. Seen by Wynell Balloon and Cardizem initiated. Echocardiogram in September of 2013 showed normal LV function. Flecanide added but pt did not hold sinus. A followup nuclear study showed mild apical ischemia. Cardiac CT in November of 2013 showed no coronary disease and a calcium score of 0. Patient subsequently had atrial fibrillation ablation in December 2013. Since then,    Current Outpatient Prescriptions  Medication Sig Dispense Refill  . atorvastatin (LIPITOR) 10 MG tablet Take 10 mg by mouth daily.       Marland Kitchen diltiazem (TIAZAC) 300 MG 24 hr capsule Take 300 mg by mouth daily.      Marland Kitchen lisinopril (PRINIVIL,ZESTRIL) 20 MG tablet Take 20 mg by mouth daily.       . pantoprazole (PROTONIX) 40 MG tablet Take 1 tablet (40 mg total) by mouth daily.  30 tablet  2  . Rivaroxaban (XARELTO) 20 MG TABS Take 1 tablet (20 mg total) by mouth daily.  30 tablet  6   No current facility-administered medications for this visit.     Past Medical History  Diagnosis Date  . Pure hypercholesterolemia   . Hypertension   . History of gallstones     LAPAROSCOPIC CHOLECYSTECTOMY 2005  . Paroxysmal atrial fibrillation     Past Surgical History  Procedure Laterality Date  . Laparoscopic cholecystectomy  07/04/2004 [facility]    DR. PAUL TOTH  . Laminectomy    . Tee without cardioversion  10/19/2012    Procedure: TRANSESOPHAGEAL ECHOCARDIOGRAM (TEE);  Surgeon: Pricilla Riffle, MD;  Location: [facility] ENDOSCOPY;  Service: Cardiovascular;  Laterality: N/A;    History   Social History  . Marital Status: Married    Spouse Name: N/A    Number of Children: N/A  . Years of Education: N/A   Occupational History  . Not on file.   Social History Main Topics  . Smoking status: Never Smoker   . Smokeless tobacco: Not on file  . Alcohol Use: Yes     Comment: rare  .  Drug Use: No  . Sexually Active: Not on file   Other Topics Concern  . Not on file   Social History Narrative   Lives in [place].  Works at [employer] as an Art gallery manager    ROS: no fevers or chills, productive cough, hemoptysis, dysphasia, odynophagia, melena, hematochezia, dysuria, hematuria, rash, seizure activity, orthopnea, PND, pedal edema, claudication. Remaining systems are negative.  Physical Exam: Well-developed well-nourished in no acute distress.  Skin is warm and dry.  HEENT is normal.  Neck is supple.  Chest is clear to auscultation with normal expansion.  Cardiovascular exam is regular rate and rhythm.  Abdominal exam nontender or distended. No masses palpated. Extremities show no edema. neuro grossly intact  ECG     This encounter was created in error - please disregard.

## 2013-01-21 ENCOUNTER — Ambulatory Visit (INDEPENDENT_AMBULATORY_CARE_PROVIDER_SITE_OTHER): Payer: BC Managed Care – PPO | Admitting: Internal Medicine

## 2013-01-21 ENCOUNTER — Encounter: Payer: Self-pay | Admitting: Internal Medicine

## 2013-01-21 VITALS — BP 144/92 | HR 72 | Ht 70.0 in | Wt 206.0 lb

## 2013-01-21 DIAGNOSIS — I4891 Unspecified atrial fibrillation: Secondary | ICD-10-CM

## 2013-01-21 DIAGNOSIS — I10 Essential (primary) hypertension: Secondary | ICD-10-CM

## 2013-01-21 NOTE — Patient Instructions (Addendum)
Your physician recommends that you schedule a follow-up appointment in 3 months with Dr Allred    

## 2013-01-21 NOTE — Progress Notes (Signed)
Primary Care Physician: Redmond Baseman, MD Referring Physician:  Dr Ranell Patrick is a 47 y.o. male with a h/o hypertension and paroxysmal atrial fibrillation who presents for follow-up.   Since his afib ablation 12/13, he has done well.  He denies procedure related complications.  He has rare palpitations for several seconds but no prolonged afib.  Today, he denies symptoms of shortness of breath, orthopnea, PND, lower extremity edema, dizziness, presyncope, syncope, or neurologic sequela. The patient is tolerating medications without difficulties and is otherwise without complaint today.   Past Medical History  Diagnosis Date  . Pure hypercholesterolemia   . Hypertension   . History of gallstones     LAPAROSCOPIC CHOLECYSTECTOMY 2005  . Paroxysmal atrial fibrillation    Past Surgical History  Procedure Laterality Date  . Laparoscopic cholecystectomy  07/04/2004 [facility]    DR. PAUL TOTH  . Laminectomy    . Tee without cardioversion  10/19/2012    Procedure: TRANSESOPHAGEAL ECHOCARDIOGRAM (TEE);  Surgeon: Pricilla Riffle, MD;  Location: [facility] ENDOSCOPY;  Service: Cardiovascular;  Laterality: N/A;    Current Outpatient Prescriptions  Medication Sig Dispense Refill  . atorvastatin (LIPITOR) 10 MG tablet Take 10 mg by mouth daily.       Marland Kitchen diltiazem (TIAZAC) 300 MG 24 hr capsule Take 300 mg by mouth daily.      Marland Kitchen lisinopril (PRINIVIL,ZESTRIL) 20 MG tablet Take 20 mg by mouth daily.       . Rivaroxaban (XARELTO) 20 MG TABS Take 1 tablet (20 mg total) by mouth daily.  30 tablet  6   No current facility-administered medications for this visit.    No Known Allergies  History   Social History  . Marital Status: Married    Spouse Name: N/A    Number of Children: N/A  . Years of Education: N/A   Occupational History  . Not on file.   Social History Main Topics  . Smoking status: Never Smoker   . Smokeless tobacco: Not on file  . Alcohol Use: Yes     Comment: rare  .  Drug Use: No  . Sexually Active: Not on file   Other Topics Concern  . Not on file   Social History Narrative   Lives in [place].  Works at [employer] as an Art gallery manager    Family History  Problem Relation Age of Onset  . Hypertension Father   . Heart attack Paternal Grandfather 97   Physical Exam: Filed Vitals:   01/21/13 0953  BP: 144/92  Pulse: 72  Height: 5\' 10"  (1.778 m)  Weight: 206 lb (93.441 kg)    GEN- The patient is well appearing, alert and oriented x 3 today.   Head- normocephalic, atraumatic Eyes-  Sclera clear, conjunctiva pink Ears- hearing intact Oropharynx- clear Neck- supple, no JVP Lymph- no cervical lymphadenopathy Lungs- Clear to ausculation bilaterally, normal work of breathing Heart- Regular rate and rhythm, no murmurs, rubs or gallops, PMI not laterally displaced GI- soft, NT, ND, + BS Extremities- no clubbing, cyanosis, or edema Neuro- strength and sensation are intact  EKG today reveals sinus rhythm 71 bpm, LVH  Assessment and Plan:  1. afib Doing well s/p ablation No changes today Could consider stopping xarelto if afib remains controlled upon return in 3 months (CHADS2VASc score is 1)  2. HTN Stable No change required today

## 2013-02-01 ENCOUNTER — Other Ambulatory Visit: Payer: Self-pay | Admitting: Family Medicine

## 2013-02-12 ENCOUNTER — Ambulatory Visit (INDEPENDENT_AMBULATORY_CARE_PROVIDER_SITE_OTHER): Payer: BC Managed Care – PPO | Admitting: Cardiology

## 2013-02-12 ENCOUNTER — Encounter: Payer: Self-pay | Admitting: Cardiology

## 2013-02-12 VITALS — BP 130/60 | HR 60 | Ht 70.0 in | Wt 209.0 lb

## 2013-02-12 DIAGNOSIS — I10 Essential (primary) hypertension: Secondary | ICD-10-CM

## 2013-02-12 DIAGNOSIS — I4891 Unspecified atrial fibrillation: Secondary | ICD-10-CM

## 2013-02-12 DIAGNOSIS — E785 Hyperlipidemia, unspecified: Secondary | ICD-10-CM

## 2013-02-12 DIAGNOSIS — R0789 Other chest pain: Secondary | ICD-10-CM

## 2013-02-12 MED ORDER — LISINOPRIL 20 MG PO TABS: 20.0000 mg | ORAL_TABLET | Freq: Every day | ORAL | Status: DC

## 2013-02-12 MED ORDER — ATORVASTATIN CALCIUM 10 MG PO TABS: 10.0000 mg | ORAL_TABLET | Freq: Every day | ORAL | Status: DC

## 2013-02-12 MED ORDER — DILTIAZEM HCL ER BEADS 300 MG PO CP24: 300.0000 mg | ORAL_CAPSULE | Freq: Every day | ORAL | Status: DC

## 2013-02-12 MED ORDER — RIVAROXABAN 20 MG PO TABS: 20.0000 mg | ORAL_TABLET | Freq: Every day | ORAL | Status: DC

## 2013-02-12 NOTE — Patient Instructions (Addendum)
Your physician wants you to follow-up in: 6 MONTHS WITH DR CRENSHAW You will receive a reminder letter in the mail two months in advance. If you don't receive a letter, please call our office to schedule the follow-up appointment.  

## 2013-02-12 NOTE — Assessment & Plan Note (Signed)
Continue present blood pressure medications. 

## 2013-02-12 NOTE — Assessment & Plan Note (Signed)
No further episodes.cardiac CT without obstructive coronary disease.

## 2013-02-12 NOTE — Assessment & Plan Note (Signed)
Patient remains in sinus rhythm on examination. Continue Cardizem and xeralto. Chads score 1 for HTN; plan is to DC xeralto in 3 months and treat with ASA at fu OV with Dr Johney Frame if sinus is maintained.

## 2013-02-12 NOTE — Progress Notes (Signed)
   HPI: Pleasant gentleman for fu of atrial fibrillation. Echocardiogram in September of 2013 showed normal LV function. Flecainide added for recurrent symptomatic atrial fibrillation but arrhythmia recurred. A followup nuclear study showed mild apical ischemia. Cardiac CT in November of 2013 showed no coronary disease and a calcium score of 0. Patient subsequently had atrial fibrillation ablation. Since his ablation, there is no dyspnea, chest pain or syncope. He has had occasional skips but no sustained palpitations.  Current Outpatient Prescriptions  Medication Sig Dispense Refill  . atorvastatin (LIPITOR) 10 MG tablet Take 10 mg by mouth daily.       Marland Kitchen diltiazem (TIAZAC) 300 MG 24 hr capsule Take 300 mg by mouth daily.      Marland Kitchen lisinopril (PRINIVIL,ZESTRIL) 20 MG tablet Take 20 mg by mouth daily.       . Rivaroxaban (XARELTO) 20 MG TABS Take 1 tablet (20 mg total) by mouth daily.  30 tablet  6   No current facility-administered medications for this visit.     Past Medical History  Diagnosis Date  . Pure hypercholesterolemia   . Hypertension   . History of gallstones     LAPAROSCOPIC CHOLECYSTECTOMY 2005  . Paroxysmal atrial fibrillation     Past Surgical History  Procedure Laterality Date  . Laparoscopic cholecystectomy  07/04/2004 [facility]    DR. PAUL TOTH  . Laminectomy    . Tee without cardioversion  10/19/2012    Procedure: TRANSESOPHAGEAL ECHOCARDIOGRAM (TEE);  Surgeon: Pricilla Riffle, MD;  Location: [facility] ENDOSCOPY;  Service: Cardiovascular;  Laterality: N/A;    History   Social History  . Marital Status: Married    Spouse Name: N/A    Number of Children: N/A  . Years of Education: N/A   Occupational History  . Not on file.   Social History Main Topics  . Smoking status: Never Smoker   . Smokeless tobacco: Not on file  . Alcohol Use: Yes     Comment: rare  . Drug Use: No  . Sexually Active: Not on file   Other Topics Concern  . Not on file   Social History  Narrative   Lives in [place].  Works at [employer] as an Art gallery manager    ROS: no fevers or chills, productive cough, hemoptysis, dysphasia, odynophagia, melena, hematochezia, dysuria, hematuria, rash, seizure activity, orthopnea, PND, pedal edema, claudication. Remaining systems are negative.  Physical Exam: Well-developed well-nourished in no acute distress.  Skin is warm and dry.  HEENT is normal.  Neck is supple.  Chest is clear to auscultation with normal expansion.  Cardiovascular exam is regular rate and rhythm.  Abdominal exam nontender or distended. No masses palpated. Extremities show no edema. neuro grossly intact

## 2013-02-12 NOTE — Assessment & Plan Note (Signed)
Continue statin. 

## 2013-02-25 ENCOUNTER — Telehealth: Payer: Self-pay | Admitting: Cardiology

## 2013-02-25 ENCOUNTER — Other Ambulatory Visit: Payer: Self-pay | Admitting: Family Medicine

## 2013-02-25 MED ORDER — ATORVASTATIN CALCIUM 40 MG PO TABS: 40.0000 mg | ORAL_TABLET | Freq: Every day | ORAL | Status: DC

## 2013-02-25 NOTE — Telephone Encounter (Signed)
Spoke with pt wife, she reports the pt has been on lipitor 40 mg for sometime. Explained to wife when we just saw him he reported only 10 mg daily. Explained this is why a current medicine list is so important when seen. She voiced understanding. Refill sent to pharm.

## 2013-02-25 NOTE — Telephone Encounter (Signed)
New Prob   Pt is need of 40 mg for LIPITOR instead of 10 mg that was e-scribed by Dr. Jens Som. Would like to speak to nurse.

## 2013-04-02 ENCOUNTER — Other Ambulatory Visit: Payer: Self-pay | Admitting: *Deleted

## 2013-04-02 DIAGNOSIS — I4891 Unspecified atrial fibrillation: Secondary | ICD-10-CM

## 2013-04-02 MED ORDER — RIVAROXABAN 20 MG PO TABS: 20.0000 mg | ORAL_TABLET | Freq: Every day | ORAL | Status: DC

## 2013-04-02 MED ORDER — LISINOPRIL 20 MG PO TABS: 20.0000 mg | ORAL_TABLET | Freq: Every day | ORAL | Status: DC

## 2013-04-02 MED ORDER — ATORVASTATIN CALCIUM 40 MG PO TABS: 40.0000 mg | ORAL_TABLET | Freq: Every day | ORAL | Status: DC

## 2013-04-07 ENCOUNTER — Other Ambulatory Visit: Payer: Self-pay | Admitting: *Deleted

## 2013-04-07 MED ORDER — DILTIAZEM HCL ER BEADS 300 MG PO CP24: 300.0000 mg | ORAL_CAPSULE | Freq: Every day | ORAL | Status: DC

## 2013-04-22 ENCOUNTER — Ambulatory Visit (INDEPENDENT_AMBULATORY_CARE_PROVIDER_SITE_OTHER): Payer: BC Managed Care – PPO | Admitting: Internal Medicine

## 2013-04-22 ENCOUNTER — Encounter: Payer: Self-pay | Admitting: Internal Medicine

## 2013-04-22 VITALS — BP 140/80 | HR 62 | Ht 70.0 in | Wt 207.0 lb

## 2013-04-22 DIAGNOSIS — I4891 Unspecified atrial fibrillation: Secondary | ICD-10-CM

## 2013-04-22 DIAGNOSIS — I10 Essential (primary) hypertension: Secondary | ICD-10-CM

## 2013-04-22 MED ORDER — ASPIRIN EC 81 MG PO TBEC: 81.0000 mg | DELAYED_RELEASE_TABLET | Freq: Every day | ORAL | Status: AC

## 2013-04-22 NOTE — Patient Instructions (Addendum)
Your physician wants you to follow-up in: 6 months with Dr Jacquiline Doe will receive a reminder letter in the mail two months in advance. If you don't receive a letter, please call our office to schedule the follow-up appointment.   Your physician has recommended you make the following change in your medication:  1) Stop Xarelto 2) Start Aspirin 81 mg Daily

## 2013-04-29 ENCOUNTER — Ambulatory Visit (INDEPENDENT_AMBULATORY_CARE_PROVIDER_SITE_OTHER): Payer: BC Managed Care – PPO

## 2013-04-29 ENCOUNTER — Encounter: Payer: Self-pay | Admitting: Family Medicine

## 2013-04-29 ENCOUNTER — Ambulatory Visit (INDEPENDENT_AMBULATORY_CARE_PROVIDER_SITE_OTHER): Payer: BC Managed Care – PPO | Admitting: Family Medicine

## 2013-04-29 VITALS — BP 134/85 | HR 67 | Temp 98.1°F | Wt 202.6 lb

## 2013-04-29 DIAGNOSIS — R590 Localized enlarged lymph nodes: Secondary | ICD-10-CM | POA: Insufficient documentation

## 2013-04-29 DIAGNOSIS — M542 Cervicalgia: Secondary | ICD-10-CM | POA: Insufficient documentation

## 2013-04-29 DIAGNOSIS — M89319 Hypertrophy of bone, unspecified shoulder: Secondary | ICD-10-CM

## 2013-04-29 DIAGNOSIS — R599 Enlarged lymph nodes, unspecified: Secondary | ICD-10-CM

## 2013-04-29 DIAGNOSIS — M948X9 Other specified disorders of cartilage, unspecified sites: Secondary | ICD-10-CM

## 2013-04-29 LAB — POCT CBC
Granulocyte percent: 84.3 %G — AB (ref 37–80)
HCT, POC: 42.8 % — AB (ref 43.5–53.7)
Hemoglobin: 15 g/dL (ref 14.1–18.1)
Lymph, poc: 1.2 (ref 0.6–3.4)
MCH, POC: 31.3 pg — AB (ref 27–31.2)
MCHC: 35.1 g/dL (ref 31.8–35.4)
MCV: 89.2 fL (ref 80–97)
MPV: 7 fL (ref 0–99.8)
POC Granulocyte: 7.1 — AB (ref 2–6.9)
POC LYMPH PERCENT: 13.8 %L (ref 10–50)
Platelet Count, POC: 212 10*3/uL (ref 142–424)
RBC: 4.8 M/uL (ref 4.69–6.13)
RDW, POC: 12.2 %
WBC: 8.4 10*3/uL (ref 4.6–10.2)

## 2013-04-29 NOTE — Progress Notes (Signed)
Patient ID: Kevin Holmes, male   DOB: 06-Aug-1966, 47 y.o.   MRN: [id number] SUBJECTIVE: CC:  HPI:   PMH/PSH: reviewed/updated in Epic  SH/FH: reviewed/updated in Epic  Allergies: reviewed/updated in Epic  Medications: reviewed/updated in Epic  Immunizations: reviewed/updated in Epic  ROS: As above in the HPI. All other systems are stable or negative.  OBJECTIVE: APPEARANCE:  Patient in no acute distress.The patient appeared well nourished and normally developed. Acyanotic. Waist: VITAL SIGNS:  SKIN: warm and  Dry without overt rashes, tattoos and scars  HEAD and Neck: without JVD, Head and scalp: normal Eyes:No scleral icterus. Fundi normal, eye movements normal. Ears: Auricle normal, canal normal, Tympanic membranes normal, insufflation normal. Nose: normal Throat: normal Neck & thyroid: normal  CHEST & LUNGS: Chest wall: normal Lungs: Clear  CVS: Reveals the PMI to be normally located. Regular rhythm, First and Second Heart sounds are normal,  absence of murmurs, rubs or gallops. Peripheral vasculature: Radial pulses: normal Dorsal pedis pulses: normal Posterior pulses: normal  ABDOMEN:  Appearance: normal Benign, no organomegaly, no masses, no Abdominal Aortic enlargement. No Guarding , no rebound. No Bruits. Bowel sounds: normal  RECTAL: N/A GU: N/A  EXTREMETIES: nonedematous. Both Femoral and Pedal pulses are normal.  MUSCULOSKELETAL:  Spine: normal Joints: intact  NEUROLOGIC: oriented to time,place and person; nonfocal. Strength is normal Sensory is normal Reflexes are normal Cranial Nerves are normal.  Results for orders placed in visit on 04/29/13  POCT CBC      Result Value Range   WBC 8.4  4.6 - 10.2 K/uL   Lymph, poc 1.2  0.6 - 3.4   POC LYMPH PERCENT 13.8  10 - 50 %L   POC Granulocyte 7.1 (*) 2 - 6.9   Granulocyte percent 84.3 (*) 37 - 80 %G   RBC 4.8  4.69 - 6.13 M/uL   Hemoglobin 15.0  14.1 - 18.1 g/dL   HCT, POC 40.9  (*) 81.1 - 53.7 %   MCV 89.2  80 - 97 fL   MCH, POC 31.3 (*) 27 - 31.2 pg   MCHC 35.1  31.8 - 35.4 g/dL   RDW, POC 91.4     Platelet Count, POC 212.0  142 - 424 K/uL   MPV 7.0  0 - 99.8 fL    ASSESSMENT: Lymphadenopathy of right cervical region - Plan: POCT CBC  Neck pain - Plan: DG Cervical Spine Complete  Clavicle enlargement - Plan: DG Shoulder Right   PLAN: [facility] reading (PRIMARY) by  Dr Modesto Charon:  Cervical spine. Degenerative changes, no acute findings Right shoulder: no acute findings. The proximal clavicular swelling is best seen on the AP cervical view and the proximal end of the clavicle on the right appears slightly thicker, but otherwise no acute findings.  Ice packs to the right clavicle.  Aleve 2 tabs bid for 1 week  Observe. If proximal clavicular swelling continues to be symptomatic , consider U/S or better yet CT. Doubt it is pathological.  Return if symptoms worsen or fail to improve.  Jonel Weldon P. Modesto Charon, M.D.

## 2013-04-29 NOTE — Progress Notes (Addendum)
  Subjective:    Patient ID: Kevin Holmes, male    DOB: [birth year], 47 y.o.   MRN: [id number] Chief Complaint  Patient presents with  . Acute Visit    fell on face 3-4 weeks ago did not go to hosp feels llike knot on rt side neck  ] HPI  Patient had fallen 3 to 4 weeks ago. Then noticed a knot at the sterno-clavicular joint.. Then a week later had a sore throat and swollen glands in the neck and has been worried that he may have something seriously wrong. The glands went down. But the swelling at the Sterno-clavicular joint persists.. When he fell, he hit the anterior right shoulder. Came to get checked.    Review of Systems See main documentation    Objective:   Physical Exam  See exam BP 134/85  Pulse 67  Temp(Src) 98.1 F (36.7 C) (Oral)  Wt 202 lb 9.6 oz (91.899 kg)  BMI 29.07 kg/m2 M/S exam The right sterno-clavicular joint is prominent, nontender. It does not dislocate.  The right shoulder has full ROM and is normal otherwise. Neurologic is intact.      Assessment & Plan:  Dx and Plan as in main note.  Sevyn Markham P. Modesto Charon, M.D.

## 2013-05-09 NOTE — Progress Notes (Signed)
Primary Care Physician: Redmond Baseman, MD Referring Physician:  Dr Ranell Patrick is a 47 y.o. male with a h/o hypertension and paroxysmal atrial fibrillation who presents for follow-up.   Since his afib ablation 12/13, he has done well. He denies afib. Today, he denies symptoms of shortness of breath, orthopnea, PND, lower extremity edema, dizziness, presyncope, syncope, or neurologic sequela. The patient is tolerating medications without difficulties and is otherwise without complaint today.   Past Medical History  Diagnosis Date  . Pure hypercholesterolemia   . Hypertension   . History of gallstones     LAPAROSCOPIC CHOLECYSTECTOMY 2005  . Paroxysmal atrial fibrillation    Past Surgical History  Procedure Laterality Date  . Laparoscopic cholecystectomy  07/04/2004 [facility]    DR. PAUL TOTH  . Laminectomy    . Tee without cardioversion  10/19/2012    Procedure: TRANSESOPHAGEAL ECHOCARDIOGRAM (TEE);  Surgeon: Pricilla Riffle, MD;  Location: [facility] ENDOSCOPY;  Service: Cardiovascular;  Laterality: N/A;    Current Outpatient Prescriptions  Medication Sig Dispense Refill  . atorvastatin (LIPITOR) 40 MG tablet Take 1 tablet (40 mg total) by mouth daily.  90 tablet  3  . diltiazem (TIAZAC) 300 MG 24 hr capsule Take 1 capsule (300 mg total) by mouth daily.  90 capsule  4  . lisinopril (PRINIVIL,ZESTRIL) 20 MG tablet Take 1 tablet (20 mg total) by mouth daily.  90 tablet  3  . aspirin EC 81 MG tablet Take 1 tablet (81 mg total) by mouth daily.  90 tablet  3   No current facility-administered medications for this visit.    No Known Allergies  History   Social History  . Marital Status: Married    Spouse Name: N/A    Number of Children: N/A  . Years of Education: N/A   Occupational History  . Not on file.   Social History Main Topics  . Smoking status: Never Smoker   . Smokeless tobacco: Not on file  . Alcohol Use: Yes     Comment: rare  . Drug Use: No  . Sexually  Active: Not on file   Other Topics Concern  . Not on file   Social History Narrative   Lives in [place].  Works at [employer] as an Art gallery manager    Family History  Problem Relation Age of Onset  . Hypertension Father   . Heart attack Paternal Grandfather 27   Physical Exam: Filed Vitals:   04/22/13 1512  BP: 140/80  Pulse: 62  Height: 5\' 10"  (1.778 m)  Weight: 207 lb (93.895 kg)    GEN- The patient is well appearing, alert and oriented x 3 today.   Head- normocephalic, atraumatic Eyes-  Sclera clear, conjunctiva pink Ears- hearing intact Oropharynx- clear Neck- supple, no JVP Lymph- no cervical lymphadenopathy Lungs- Clear to ausculation bilaterally, normal work of breathing Heart- Regular rate and rhythm, no murmurs, rubs or gallops, PMI not laterally displaced GI- soft, NT, ND, + BS Extremities- no clubbing, cyanosis, or edema Neuro- strength and sensation are intact  EKG today reveals sinus rhythm  Assessment and Plan:  1. afib Doing well s/p ablation without recurrence off of AAD CHADS2VASc score is 1.  I will therefore stop xarelto and restart ASA as per guideline recommendation  2. HTN Stable No change required today

## 2013-07-05 ENCOUNTER — Encounter: Payer: Self-pay | Admitting: Family Medicine

## 2013-07-05 ENCOUNTER — Ambulatory Visit (INDEPENDENT_AMBULATORY_CARE_PROVIDER_SITE_OTHER): Payer: BC Managed Care – PPO | Admitting: Family Medicine

## 2013-07-05 ENCOUNTER — Telehealth: Payer: Self-pay | Admitting: Family Medicine

## 2013-07-05 VITALS — BP 146/93 | HR 64 | Temp 97.2°F | Ht 70.0 in | Wt 218.0 lb

## 2013-07-05 DIAGNOSIS — H109 Unspecified conjunctivitis: Secondary | ICD-10-CM

## 2013-07-05 MED ORDER — POLYMYXIN B-TRIMETHOPRIM 10000-0.1 UNIT/ML-% OP SOLN: 1.0000 [drp] | OPHTHALMIC | Status: DC

## 2013-07-05 NOTE — Telephone Encounter (Signed)
appt made

## 2013-07-05 NOTE — Patient Instructions (Signed)

## 2013-07-05 NOTE — Progress Notes (Signed)
  Subjective:    Patient ID: Kevin Holmes, male    DOB: 12-16-1965, 47 y.o.   MRN: [id number]  HPI  This 47 y.o. male presents for evaluation of irritation left eye.  He was outside working [employer] and noticed some left eye irritation this am and he noticed his left eye was more  Injected than the right.  Review of Systems No chest pain, SOB, HA, dizziness, vision change, N/V, diarrhea, constipation, dysuria, urinary urgency or frequency, myalgias, arthralgias or rash.     Objective:   Physical Exam Vital signs noted  Well developed well nourished male.  HEENT - Head atraumatic Normocephalic                Eyes - PERRLA, Conjuctiva injected OS - clear  jOD Sclera- Clear EOMI                Ears - EAC's Wnl TM's Wnl Gross Hearing WNL                Nose - Nares patent                 Throat - oropharanx wnl Respiratory - Lungs CTA bilateral Cardiac - RRR S1 and S2 without murmur GI - Abdomen soft Nontender and bowel sounds active x 4 Extremities - No edema. Neuro - Grossly intact.       Assessment & Plan:  Conjunctivitis - Plan: trimethoprim-polymyxin b (POLYTRIM) ophthalmic solution

## 2013-08-13 ENCOUNTER — Ambulatory Visit: Payer: BC Managed Care – PPO | Admitting: Cardiology

## 2013-08-30 ENCOUNTER — Ambulatory Visit (INDEPENDENT_AMBULATORY_CARE_PROVIDER_SITE_OTHER): Payer: BC Managed Care – PPO | Admitting: Cardiology

## 2013-08-30 ENCOUNTER — Encounter: Payer: Self-pay | Admitting: Cardiology

## 2013-08-30 ENCOUNTER — Encounter (INDEPENDENT_AMBULATORY_CARE_PROVIDER_SITE_OTHER): Payer: Self-pay

## 2013-08-30 VITALS — BP 132/74 | HR 69 | Ht 70.0 in | Wt 216.0 lb

## 2013-08-30 DIAGNOSIS — E785 Hyperlipidemia, unspecified: Secondary | ICD-10-CM

## 2013-08-30 DIAGNOSIS — I4891 Unspecified atrial fibrillation: Secondary | ICD-10-CM

## 2013-08-30 DIAGNOSIS — I10 Essential (primary) hypertension: Secondary | ICD-10-CM

## 2013-08-30 NOTE — Progress Notes (Signed)
      HPI: Pleasant gentleman for fu of atrial fibrillation. Echocardiogram in September of 2013 showed normal LV function. Flecainide added for recurrent symptomatic atrial fibrillation but arrhythmia recurred. A followup nuclear study showed mild apical ischemia. Cardiac CT in November of 2013 showed no coronary disease and a calcium score of 0. Patient subsequently had atrial fibrillation ablation. Since he was last seen in April 2014, the patient denies any dyspnea on exertion, orthopnea, PND, pedal edema, palpitations, syncope or chest pain.    Current Outpatient Prescriptions  Medication Sig Dispense Refill  . aspirin EC 81 MG tablet Take 1 tablet (81 mg total) by mouth daily.  90 tablet  3  . atorvastatin (LIPITOR) 40 MG tablet Take 1 tablet (40 mg total) by mouth daily.  90 tablet  3  . diltiazem (TIAZAC) 300 MG 24 hr capsule Take 1 capsule (300 mg total) by mouth daily.  90 capsule  4  . lisinopril (PRINIVIL,ZESTRIL) 20 MG tablet Take 1 tablet (20 mg total) by mouth daily.  90 tablet  3   No current facility-administered medications for this visit.     Past Medical History  Diagnosis Date  . Pure hypercholesterolemia   . Hypertension   . History of gallstones     LAPAROSCOPIC CHOLECYSTECTOMY 2005  . Paroxysmal atrial fibrillation     Past Surgical History  Procedure Laterality Date  . Laparoscopic cholecystectomy  07/04/2004 [facility]    DR. PAUL TOTH  . Laminectomy    . Tee without cardioversion  10/19/2012    Procedure: TRANSESOPHAGEAL ECHOCARDIOGRAM (TEE);  Surgeon: Pricilla Riffle, MD;  Location: [facility] ENDOSCOPY;  Service: Cardiovascular;  Laterality: N/A;    History   Social History  . Marital Status: Married    Spouse Name: N/A    Number of Children: N/A  . Years of Education: N/A   Occupational History  . Not on file.   Social History Main Topics  . Smoking status: Never Smoker   . Smokeless tobacco: Not on file  . Alcohol Use: Yes     Comment: rare  . Drug  Use: No  . Sexual Activity: Not on file   Other Topics Concern  . Not on file   Social History Narrative   Lives in [place].  Works at [employer] as an Art gallery manager    ROS: no fevers or chills, productive cough, hemoptysis, dysphasia, odynophagia, melena, hematochezia, dysuria, hematuria, rash, seizure activity, orthopnea, PND, pedal edema, claudication. Remaining systems are negative.  Physical Exam: Well-developed well-nourished in no acute distress.  Skin is warm and dry.  HEENT is normal.  Neck is supple.  Chest is clear to auscultation with normal expansion.  Cardiovascular exam is regular rate and rhythm.  Abdominal exam nontender or distended. No masses palpated. Extremities show no edema. neuro grossly intact  ECG sinus rhythm at a rate of 69. No ST changes.

## 2013-08-30 NOTE — Patient Instructions (Signed)
Your physician wants you to follow-up in: 6 MONTHS WITH DR CRENSHAW You will receive a reminder letter in the mail two months in advance. If you don't receive a letter, please call our office to schedule the follow-up appointment.  

## 2013-08-30 NOTE — Assessment & Plan Note (Signed)
Blood pressure controlled. Continue present medications. 

## 2013-08-30 NOTE — Assessment & Plan Note (Signed)
Continue statin. 

## 2013-08-30 NOTE — Assessment & Plan Note (Signed)
Continue aspirin. CHADS score 1. Continue Cardizem.

## 2013-10-20 ENCOUNTER — Ambulatory Visit: Payer: BC Managed Care – PPO | Admitting: Internal Medicine

## 2014-01-26 ENCOUNTER — Ambulatory Visit (INDEPENDENT_AMBULATORY_CARE_PROVIDER_SITE_OTHER): Payer: BC Managed Care – PPO

## 2014-01-26 ENCOUNTER — Encounter: Payer: Self-pay | Admitting: General Practice

## 2014-01-26 ENCOUNTER — Ambulatory Visit (INDEPENDENT_AMBULATORY_CARE_PROVIDER_SITE_OTHER): Payer: BC Managed Care – PPO | Admitting: General Practice

## 2014-01-26 VITALS — BP 153/88 | HR 72 | Temp 97.2°F | Ht 70.0 in | Wt 211.0 lb

## 2014-01-26 DIAGNOSIS — N200 Calculus of kidney: Secondary | ICD-10-CM

## 2014-01-26 DIAGNOSIS — R110 Nausea: Secondary | ICD-10-CM

## 2014-01-26 DIAGNOSIS — R109 Unspecified abdominal pain: Secondary | ICD-10-CM

## 2014-01-26 LAB — POCT UA - MICROSCOPIC ONLY
BACTERIA, U MICROSCOPIC: NEGATIVE
Casts, Ur, LPF, POC: NEGATIVE
MUCUS UA: NEGATIVE
WBC, UR, HPF, POC: NEGATIVE
YEAST UA: NEGATIVE

## 2014-01-26 LAB — POCT URINALYSIS DIPSTICK
BILIRUBIN UA: NEGATIVE
GLUCOSE UA: NEGATIVE
KETONES UA: NEGATIVE
LEUKOCYTES UA: NEGATIVE
Nitrite, UA: NEGATIVE
Protein, UA: 100
Spec Grav, UA: <=1.005
Urobilinogen, UA: NEGATIVE
pH, UA: 8.5

## 2014-01-26 MED ORDER — OXYCODONE-ACETAMINOPHEN 5-325 MG PO TABS: 1.0000 | ORAL_TABLET | Freq: Four times a day (QID) | ORAL | Status: DC | PRN

## 2014-01-26 MED ORDER — PROMETHAZINE HCL 25 MG/ML IJ SOLN
12.5000 mg | Freq: Once | INTRAMUSCULAR | Status: AC
  Administered 2014-01-26: 12.5 mg via INTRAMUSCULAR

## 2014-01-26 MED ORDER — MEPERIDINE HCL 25 MG/ML IJ SOLN: 50.0000 mg | Freq: Once | INTRAMUSCULAR | Status: DC

## 2014-01-26 MED ORDER — MEPERIDINE HCL 50 MG/ML IJ SOLN
50.0000 mg | Freq: Once | INTRAMUSCULAR | Status: AC
  Administered 2014-01-26: 50 mg via INTRAMUSCULAR

## 2014-01-26 NOTE — Progress Notes (Signed)
   Subjective:    Patient ID: Kevin Holmes, male    DOB: 04/14/1966, 48 y.o.   MRN: [id number]  Flank Pain This is a new problem. The current episode started today. The problem occurs constantly. The problem has been gradually worsening since onset. The quality of the pain is described as aching and burning. The pain does not radiate. The pain is at a severity of 10/10. The pain is severe. The pain is the same all the time. Associated symptoms include dysuria. Pertinent negatives include no chest pain, fever, headaches, numbness, tingling or weakness. He has tried nothing for the symptoms.      Review of Systems  Constitutional: Negative for fever and chills.  Respiratory: Negative for chest tightness and shortness of breath.   Cardiovascular: Negative for chest pain and palpitations.  Genitourinary: Positive for dysuria and flank pain.  Neurological: Negative for dizziness, tingling, weakness, numbness and headaches.       Objective:   Physical Exam  Constitutional: He is oriented to person, place, and time. He appears well-developed and well-nourished.  Cardiovascular: Normal rate, regular rhythm and normal heart sounds.   Pulmonary/Chest: Effort normal and breath sounds normal. No respiratory distress. He exhibits no tenderness.  Abdominal: Soft. Bowel sounds are normal. He exhibits no distension. There is no tenderness.  Neurological: He is alert and oriented to person, place, and time.  Skin: Skin is warm and dry.  Psychiatric: He has a normal mood and affect.     Results for orders placed in visit on 01/26/14  POCT URINALYSIS DIPSTICK      Result Value Ref Range   Color, UA yellow     Clarity, UA cloudy     Glucose, UA neg     Bilirubin, UA neg     Ketones, UA neg     Spec Grav, UA <=1.005     Blood, UA large     pH, UA 8.5     Protein, UA 100     Urobilinogen, UA negative     Nitrite, UA neg     Leukocytes, UA Negative    POCT UA - MICROSCOPIC ONLY      Result  Value Ref Range   WBC, Ur, HPF, POC neg     RBC, urine, microscopic 10-15     Bacteria, U Microscopic neg     Mucus, UA neg     Epithelial cells, urine per micros occ     Crystals, Ur, HPF, POC occ     Casts, Ur, LPF, POC neg     Yeast, UA neg     [facility] reading (PRIMARY) by Coralie KeensMae E. Holmes Brew, FNP-C, left renal calculi noted.      Assessment & Plan:  1. Kidney stone  - POCT urinalysis dipstick - POCT UA - Microscopic Only - DG Abd 1 View; Future  2. Flank pain  - oxyCODONE-acetaminophen (PERCOCET/ROXICET) 5-325 MG per tablet; Take 1-2 tablets by mouth every 6 (six) hours as needed for severe pain.  Dispense: 20 tablet; Refill: 0 - meperidine (DEMEROL) injection 50 mg; Inject 1 mL (50 mg total) into the muscle once. -sedation precautions discussed   3. Nausea alone  - promethazine (PHENERGAN) injection 12.5 mg; Inject 0.5 mLs (12.5 mg total) into the muscle once. -Patient's wife spoke with urology office and patient to be seen today -RTO prn Patient and wife verbalized understanding Coralie KeensMae E. Elvis Boot, FNP-C

## 2014-01-26 NOTE — Patient Instructions (Signed)
Kidney Stones  Kidney stones (urolithiasis) are deposits that form inside your kidneys. The intense pain is caused by the stone moving through the urinary tract. When the stone moves, the ureter goes into spasm around the stone. The stone is usually passed in the urine.   CAUSES   · A disorder that makes certain neck glands produce too much parathyroid hormone (primary hyperparathyroidism).  · A buildup of uric acid crystals, similar to gout in your joints.  · Narrowing (stricture) of the ureter.  · A kidney obstruction present at birth (congenital obstruction).  · Previous surgery on the kidney or ureters.  · Numerous kidney infections.  SYMPTOMS   · Feeling sick to your stomach (nauseous).  · Throwing up (vomiting).  · Blood in the urine (hematuria).  · Pain that usually spreads (radiates) to the groin.  · Frequency or urgency of urination.  DIAGNOSIS   · Taking a history and physical exam.  · Blood or urine tests.  · CT scan.  · Occasionally, an examination of the inside of the urinary bladder (cystoscopy) is performed.  TREATMENT   · Observation.  · Increasing your fluid intake.  · Extracorporeal shock wave lithotripsy This is a noninvasive procedure that uses shock waves to break up kidney stones.  · Surgery may be needed if you have severe pain or persistent obstruction. There are various surgical procedures. Most of the procedures are performed with the use of small instruments. Only small incisions are needed to accommodate these instruments, so recovery time is minimized.  The size, location, and chemical composition are all important variables that will determine the proper choice of action for you. Talk to your health care provider to better understand your situation so that you will minimize the risk of injury to yourself and your kidney.   HOME CARE INSTRUCTIONS   · Drink enough water and fluids to keep your urine clear or pale yellow. This will help you to pass the stone or stone fragments.  · Strain  all urine through the provided strainer. Keep all particulate matter and stones for your health care provider to see. The stone causing the pain may be as small as a grain of salt. It is very important to use the strainer each and every time you pass your urine. The collection of your stone will allow your health care provider to analyze it and verify that a stone has actually passed. The stone analysis will often identify what you can do to reduce the incidence of recurrences.  · Only take over-the-counter or prescription medicines for pain, discomfort, or fever as directed by your health care provider.  · Make a follow-up appointment with your health care provider as directed.  · Get follow-up X-rays if required. The absence of pain does not always mean that the stone has passed. It may have only stopped moving. If the urine remains completely obstructed, it can cause loss of kidney function or even complete destruction of the kidney. It is your responsibility to make sure X-rays and follow-ups are completed. Ultrasounds of the kidney can show blockages and the status of the kidney. Ultrasounds are not associated with any radiation and can be performed easily in a matter of minutes.  SEEK MEDICAL CARE IF:  · You experience pain that is progressive and unresponsive to any pain medicine you have been prescribed.  SEEK IMMEDIATE MEDICAL CARE IF:   · Pain cannot be controlled with the prescribed medicine.  · You have a fever   or shaking chills.  · The severity or intensity of pain increases over 18 hours and is not relieved by pain medicine.  · You develop a new onset of abdominal pain.  · You feel faint or pass out.  · You are unable to urinate.  MAKE SURE YOU:   · Understand these instructions.  · Will watch your condition.  · Will get help right away if you are not doing well or get worse.  Document Released: 10/28/2005 Document Revised: 06/30/2013 Document Reviewed: 03/31/2013  [employer]® Patient Information ©2014  [employer].

## 2014-02-10 ENCOUNTER — Other Ambulatory Visit: Payer: Self-pay | Admitting: Cardiology

## 2014-05-11 ENCOUNTER — Other Ambulatory Visit: Payer: Self-pay | Admitting: Cardiology

## 2014-05-16 ENCOUNTER — Other Ambulatory Visit: Payer: Self-pay | Admitting: Cardiology

## 2014-08-09 ENCOUNTER — Other Ambulatory Visit: Payer: Self-pay | Admitting: Cardiology

## 2014-08-28 ENCOUNTER — Telehealth: Payer: Self-pay | Admitting: Internal Medicine

## 2014-08-28 NOTE — Telephone Encounter (Signed)
Spoke with patient by phone to follow-up on his afib.  He states "I am doing absolutely fantastic".  He denies any symptoms of afib since his ablation and is pleased with the results of the procedure.  He is on no AAD therapy at this time.  He would like for Melissa to call him to arrange routine follow-up.

## 2014-09-16 ENCOUNTER — Other Ambulatory Visit: Payer: Self-pay | Admitting: *Deleted

## 2014-09-16 ENCOUNTER — Encounter: Payer: Self-pay | Admitting: Nurse Practitioner

## 2014-09-16 ENCOUNTER — Ambulatory Visit: Payer: BC Managed Care – PPO | Admitting: Radiology

## 2014-09-16 ENCOUNTER — Ambulatory Visit (INDEPENDENT_AMBULATORY_CARE_PROVIDER_SITE_OTHER): Payer: BC Managed Care – PPO | Admitting: Nurse Practitioner

## 2014-09-16 ENCOUNTER — Telehealth: Payer: Self-pay | Admitting: Internal Medicine

## 2014-09-16 ENCOUNTER — Telehealth: Payer: Self-pay | Admitting: *Deleted

## 2014-09-16 VITALS — BP 130/80 | HR 69 | Ht 69.5 in | Wt 212.6 lb

## 2014-09-16 DIAGNOSIS — E876 Hypokalemia: Secondary | ICD-10-CM

## 2014-09-16 DIAGNOSIS — R079 Chest pain, unspecified: Secondary | ICD-10-CM

## 2014-09-16 DIAGNOSIS — I4891 Unspecified atrial fibrillation: Secondary | ICD-10-CM

## 2014-09-16 LAB — CBC
HCT: 45.5 % (ref 39.0–52.0)
Hemoglobin: 15.1 g/dL (ref 13.0–17.0)
MCHC: 33.2 g/dL (ref 30.0–36.0)
MCV: 90.3 fl (ref 78.0–100.0)
Platelets: 219 10*3/uL (ref 150.0–400.0)
RBC: 5.04 Mil/uL (ref 4.22–5.81)
RDW: 13.1 % (ref 11.5–15.5)
WBC: 5.5 10*3/uL (ref 4.0–10.5)

## 2014-09-16 LAB — TSH: TSH: 0.91 u[IU]/mL (ref 0.35–4.50)

## 2014-09-16 LAB — HEPATIC FUNCTION PANEL
ALT: 51 U/L (ref 0–53)
AST: 26 U/L (ref 0–37)
Albumin: 3.7 g/dL (ref 3.5–5.2)
Alkaline Phosphatase: 97 U/L (ref 39–117)
Bilirubin, Direct: 0.2 mg/dL (ref 0.0–0.3)
Total Bilirubin: 1.2 mg/dL (ref 0.2–1.2)
Total Protein: 7.5 g/dL (ref 6.0–8.3)

## 2014-09-16 LAB — BASIC METABOLIC PANEL
BUN: 10 mg/dL (ref 6–23)
CO2: 28 mEq/L (ref 19–32)
Calcium: 9.3 mg/dL (ref 8.4–10.5)
Chloride: 105 mEq/L (ref 96–112)
Creatinine, Ser: 0.9 mg/dL (ref 0.4–1.5)
GFR: 90.29 mL/min (ref >60.00)
Glucose, Bld: 94 mg/dL (ref 70–99)
Potassium: 3.2 mEq/L — ABNORMAL LOW (ref 3.5–5.1)
Sodium: 140 mEq/L (ref 135–145)

## 2014-09-16 LAB — LIPID PANEL
Cholesterol: 160 mg/dL (ref 0–200)
HDL: 38.4 mg/dL — ABNORMAL LOW (ref >39.00)
LDL Cholesterol: 108 mg/dL — ABNORMAL HIGH (ref 0–99)
NonHDL: 121.6
Total CHOL/HDL Ratio: 4
Triglycerides: 69 mg/dL (ref 0.0–149.0)
VLDL: 13.8 mg/dL (ref 0.0–40.0)

## 2014-09-16 LAB — TROPONIN I: Troponin I: <0.3 ng/mL (ref <0.30)

## 2014-09-16 MED ORDER — POTASSIUM CHLORIDE ER 10 MEQ PO TBCR: 10.0000 meq | EXTENDED_RELEASE_TABLET | Freq: Every day | ORAL | Status: DC

## 2014-09-16 NOTE — Telephone Encounter (Signed)
Call from [facility] with stat troponin results:  Troponin <0.30, no indication of myocardial injury. Informed CMA [name] for Kevin FredricksonLori Gerhardt, NP.

## 2014-09-16 NOTE — Patient Instructions (Addendum)
We will be checking the following labs today BMET, CBC, LFTs, TSH, Lipids and troponin  We will arrange for a treadmill test today -  This looks very good!  Follow up with your primary care doctor.  Call the [facility] office at [phone] if you have any questions, problems or concerns.

## 2014-09-16 NOTE — Progress Notes (Signed)
Treadmill test under Lawson FiscalLori Gerhardt's OV note from today.

## 2014-09-16 NOTE — Telephone Encounter (Signed)
New Message  Pt wife called.. States the pt is not in pain but has no energy and chest pressure with a tingling headache. She reports he doesn't have a family physicians but he would like to be seen today/sr

## 2014-09-16 NOTE — Telephone Encounter (Signed)
Spoke with patient's wife who states that for the last few days her husband has had nor energy. And is now complaining of chest pain with radiation to his left arm.   Over the last few months has had intermittent chest pain and now it has become worse and is radiating into his left arm.  He was seen in 2014 with Dr Jens Somrenshaw and was due follow up in 6 months but was out of town at that time.  His wife is very concerned and she called his PCP's office this morning of which was [facility] (Dr Modesto CharonWong no longer at the practice) offered an appointment with a PA at 9:45 but they did not accept [place].  I have added him to Norma FredricksonLori Gerhardt, NP now as they are already in [place].  She was very appreciative of the appointment.

## 2014-09-16 NOTE — Progress Notes (Signed)
Tasia CatchingsJohn W Pascuzzi Date of Birth: 01/24/1966 Medical Record #[id number]  History of Present Illness: Mr. Lyman BishopLawrence is seen today for a work in visit. Seen for Dr. Jens Somrenshaw. He has a history of PAF - on flecainide. Past nuclear study has shown some mild apical ischemia. Cardiac CT from November of 2013 showed no CAD and calcium score of 1. He has had prior ablation - no longer on AAD therapy.   Has not been seen in over one year.  Called here today with:     "Spoke with patient's wife who states that for the last few days her husband has had nor energy. And is now complaining of chest pain with radiation to his left arm. Over the last few months has had intermittent chest pain and now it has become worse and is radiating into his left arm. He was seen in 2014 with Dr Jens Somrenshaw and was due follow up in 6 months but was out of town at that time. His wife is very concerned and she called his PCP's office this morning of which was [facility] (Dr Modesto CharonWong no longer at the practice) offered an appointment with a PA at 9:45 but they did not accept [place]. I have added him to Norma FredricksonLori Zachari Alberta, NP now as they are already in [place]. She was very appreciative of the appointment."      Thus added to the flex schedule for today.   Comes in today. Here with his wife. He notes that he has done well up until just a few days ago. Now very fatigued. No energy. No appetite. Left arm tingling and his fingers feel numb. Upper left chest is sore/tight. He can feel this sometimes if he twists a certain way. No palpable pain. Comes and goes - lasts for a few seconds. Not short of breath. No other associated symptoms. No fever or chills. No known neck issues. Has had back issues. Not short of breath. Does not exercise. Previously seen by Dr. Modesto CharonWong. BP has been ok. Does work on a computer every day.   Current Outpatient Prescriptions  Medication Sig Dispense Refill  . aspirin EC  81 MG tablet Take 1 tablet (81 mg total) by mouth daily. 90 tablet 3  . atorvastatin (LIPITOR) 40 MG tablet TAKE 1 TABLET DAILY (NEED TO CONTACT OFFICE TO SCHEDULE APPOINTMENT FOR FUTURE REFILLS) 90 tablet 0  . diltiazem (TIAZAC) 300 MG 24 hr capsule TAKE 1 CAPSULE (300 MG TOTAL) DAILY 90 capsule 1  . lisinopril (PRINIVIL,ZESTRIL) 20 MG tablet TAKE 1 TABLET DAILY (NEED TO CONTACT OFFICE TO SCHEDULE APPOINTMENT FOR FUTURE REFILLS [phone]) 90 tablet 0  . oxyCODONE-acetaminophen (PERCOCET/ROXICET) 5-325 MG per tablet Take 1-2 tablets by mouth every 6 (six) hours as needed for severe pain. 20 tablet 0   No current facility-administered medications for this visit.    No Known Allergies  Past Medical History  Diagnosis Date  . Pure hypercholesterolemia   . Hypertension   . History of gallstones     LAPAROSCOPIC CHOLECYSTECTOMY 2005  . Paroxysmal atrial fibrillation     Past Surgical History  Procedure Laterality Date  . Laparoscopic cholecystectomy  07/04/2004 [facility]    DR. PAUL TOTH  . Laminectomy    . Tee without cardioversion  10/19/2012    Procedure: TRANSESOPHAGEAL ECHOCARDIOGRAM (TEE);  Surgeon: Pricilla RifflePaula V Ross, MD;  [facility];  Service: Cardiovascular;  Laterality: N/A;    History  Smoking status  .  Never Smoker   Smokeless tobacco  . Not on file    History  Alcohol Use  . Yes    Comment: rare    Family History  Problem Relation Age of Onset  . Hypertension Father   . Heart attack Paternal Grandfather 6264    Review of Systems: The review of systems is per the HPI.  All other systems were reviewed and are negative.  Physical Exam: BP 130/80 mmHg  Pulse 69  Ht 5' 9.5" (1.765 m)  Wt 212 lb 9.6 oz (96.435 kg)  BMI 30.96 kg/m2 Patient is very pleasant and in no acute distress. Skin is warm and dry. Color is normal.  HEENT is unremarkable. Normocephalic/atraumatic. PERRL. Sclera are nonicteric. Neck is supple. No masses. No JVD. Lungs are clear. Cardiac  exam shows a regular rate and rhythm. He has no palpable chest wall pain. Abdomen is soft. Extremities are without edema. Gait and ROM are intact. No gross neurologic deficits noted.  Wt Readings from Last 3 Encounters:  09/16/14 212 lb 9.6 oz (96.435 kg)  01/26/14 211 lb (95.709 kg)  08/30/13 216 lb (97.977 kg)    LABORATORY DATA/PROCEDURES:  Lab Results  Component Value Date   WBC 8.4 04/29/2013   HGB 15.0 04/29/2013   HCT 42.8* 04/29/2013   PLT 196.0 10/05/2012   GLUCOSE 100* 10/05/2012   NA 139 10/05/2012   K 3.6 10/05/2012   CL 104 10/05/2012   CREATININE 0.8 10/05/2012   BUN 9 10/05/2012   CO2 30 10/05/2012   TSH 1.28 09/07/2012    BNP (last 3 results) No results for input(s): PROBNP in the last 8760 hours.  CARDIAC CTA WITH CALCIUM SCORE 10/16/2012 13:58:00  Protocol: The patient scanned on a Philips 256 slice scanner. Prospective gating with 5% phase tolerance and 120 kV was used. 5 mg of IV Lopressor was administered. Average heart rate during the scan was 60 beats per minute. After an initial AP and lateral topogram, 3 mm axial slices were performed through the heart for calcium scoring. The patient then had a 80 ml bolus of contrast given for coronary CTA with bolus tracking from the descending thoracic aorta. The 3-D data set was then sent to the Phlips workstation. Reconstructions were done using MIP,MPR and VRT modes.  Indications: Chest pain  DETAILED FINDINGS:  Quality of Study: Good  Left Main: No plaque or stenosis.  Left Anterior Descending: Small D1, moderate to large D2. The LAD beyond D2 was relatively small in caliber. No plaque or stenosis noted.  Left Circumflex: No plaque or stenosis.  Right Coronary Artery: Dominant vessel with no plaque or stenosis.  Coronary Calcium Score: 0 Agatston units  Other: The pulmonary veins drained normally to the left atrium.  IMPRESSION: 1. No coronary disease noted.  2.  Coronary artery calcium score of 0 Agatston units, suggesting low risk of future cardiac events.  3. Normal pulmonary veins.      Assessment / Plan: 1. Chest pain and left arm pain - has no CAD by CT 2 years ago - will check labs today - arrange GXT -  Further disposition to follow.   2. PAF - prior ablation - he is in sinus  Patient is agreeable to this plan and will call if any problems develop in the interim.   Rosalio MacadamiaLori C. Eliese Kerwood, RN, ANP-C [facility] [address] [place], [place]  [id number] [phone]   Addendum   Exercise Treadmill Test  Pre-Exercise Testing Evaluation Rhythm: normal sinus  Rate: 80   PR:   QRS:    QT:   QTc:    P axis:   QRS axis:    ST Segments:       Test  Exercise Tolerance Test Ordering MD: Norma Fredrickson, NP  Interpreting MD: Norma Fredrickson, NP  Unique Test No: 1 Treadmill:  1  Indication for ETT: chest pain - rule out ischemia  Contraindication to ETT: No   Stress Modality: exercise - treadmill  Cardiac Imaging Performed: non   Protocol: standard Bruce - maximal  Max BP:  194/71  Max MPHR (bpm):  170 85% MPR (bpm):  145  MPHR obtained (bpm):  162 % MPHR obtained:  95%  Reached 85% MPHR (min:sec):  6:58 Total Exercise Time (min-sec):  9 minutes  Workload in METS:  10.1 Borg Scale: 16  Reason ETT Terminated:  patient's desire to stop    ST Segment Analysis At Rest: normal ST segments - no evidence of significant ST depression With Exercise: no evidence of significant ST depression  Other Information Arrhythmia:  No Angina during ETT:  absent (0) Quality of ETT:  diagnostic  ETT Interpretation:  normal - no evidence of ischemia by ST analysis  Comments: Patient presents today for routine GXT. Seen earlier this am with atypical chest and arm pain.   Today the patient exercised on the standard Bruce protocol for a total of 9 minutes.  Good exercise tolerance.  Adequate blood pressure  response.  Clinically negative for chest pain. Test was stopped due to achievement of target HR.  EKG negative for ischemia. No significant arrhythmia noted.   Recommendations: Await lab results.   CV risk factor modification  See back as planned  Follow up with PCP  Patient is agreeable to this plan and will call if any problems develop in the interim.   Rosalio Macadamia, RN, ANP-C [facility] [address] [place], [place]  [address] [phone]

## 2014-09-23 ENCOUNTER — Telehealth: Payer: Self-pay | Admitting: Nurse Practitioner

## 2014-09-23 ENCOUNTER — Other Ambulatory Visit (INDEPENDENT_AMBULATORY_CARE_PROVIDER_SITE_OTHER): Payer: BC Managed Care – PPO | Admitting: *Deleted

## 2014-09-23 DIAGNOSIS — E876 Hypokalemia: Secondary | ICD-10-CM

## 2014-09-23 LAB — BASIC METABOLIC PANEL
BUN: 13 mg/dL (ref 6–23)
CO2: 25 mEq/L (ref 19–32)
Calcium: 9 mg/dL (ref 8.4–10.5)
Chloride: 108 mEq/L (ref 96–112)
Creatinine, Ser: 1 mg/dL (ref 0.4–1.5)
GFR: 85.04 mL/min (ref >60.00)
Glucose, Bld: 98 mg/dL (ref 70–99)
Potassium: 3.8 mEq/L (ref 3.5–5.1)
Sodium: 142 mEq/L (ref 135–145)

## 2014-09-23 NOTE — Telephone Encounter (Signed)
New message      Returning a call to Duwayne HeckDanielle to get test results

## 2014-10-15 ENCOUNTER — Other Ambulatory Visit: Payer: Self-pay | Admitting: Cardiology

## 2014-10-15 NOTE — Telephone Encounter (Signed)
Rx was sent to pharmacy electronically. 

## 2014-10-20 ENCOUNTER — Encounter (HOSPITAL_COMMUNITY): Payer: Self-pay | Admitting: Internal Medicine

## 2014-10-21 ENCOUNTER — Other Ambulatory Visit: Payer: Self-pay | Admitting: Cardiology

## 2014-10-21 NOTE — Telephone Encounter (Signed)
Rx has been sent to the pharmacy electronically. ° °

## 2015-01-13 ENCOUNTER — Other Ambulatory Visit: Payer: Self-pay | Admitting: Cardiology

## 2015-01-13 ENCOUNTER — Other Ambulatory Visit: Payer: Self-pay | Admitting: Nurse Practitioner

## 2015-04-13 ENCOUNTER — Other Ambulatory Visit: Payer: Self-pay | Admitting: Cardiology

## 2015-04-18 ENCOUNTER — Other Ambulatory Visit: Payer: Self-pay | Admitting: Cardiology

## 2015-04-19 NOTE — Telephone Encounter (Signed)
Per note 1.16.15 

## 2015-05-22 ENCOUNTER — Other Ambulatory Visit: Payer: Self-pay | Admitting: Cardiology

## 2015-05-22 ENCOUNTER — Telehealth: Payer: Self-pay | Admitting: Internal Medicine

## 2015-05-22 DIAGNOSIS — I480 Paroxysmal atrial fibrillation: Secondary | ICD-10-CM

## 2015-05-22 NOTE — Telephone Encounter (Signed)
Dr Jens Somrenshaw is his primary cardiologist.  I have not seen since 2014. If he would prefer, I can see as needed going forward.  Can discuss KDur when he sees Dr Jens Somrenshaw in a few weeks.

## 2015-05-22 NOTE — Telephone Encounter (Signed)
Patient calling with two questions:   1. He has appointments with both Dr. Johney FrameAllred and Dr. Jens Somrenshaw.  Does he continue to see both physicians or is one now his "main" cardiology MD? 2. He has been increasingly active since February with weight-lifting three times per week. He is now drinking protein shakes regularly with work outs (such as Muscle Milk 25 gm protein). Patient would like to know if this would alter his need for his potassium supplement and does he really still need KDur 10 mEq.  His last BMET was November 2015. Will route to Dr. Johney FrameAllred for advisement.

## 2015-05-22 NOTE — Telephone Encounter (Signed)
NewMEssage  Pt calling to speak w/ RN concerning current condition. Pt sched appt to see Dr. Johney FrameAllred for 8/24. Please call back and discuss.

## 2015-05-23 NOTE — Telephone Encounter (Signed)
I will be happy to see; will refer to Dr Johney FrameAllred in the future if needed; does not need to see us both; check bmet; make sure pt has fu ov (nonurgent). Kevin MillersBrian Crenshaw

## 2015-05-23 NOTE — Telephone Encounter (Signed)
Will forward to Dr Crenshaw  

## 2015-05-23 NOTE — Telephone Encounter (Signed)
Pt advised, verbalized understanding, BMET scheduled for 06/07/15 [facility] appt with Dr Jens Somrenshaw 06/12/15 ([facility]).

## 2015-06-07 ENCOUNTER — Other Ambulatory Visit (INDEPENDENT_AMBULATORY_CARE_PROVIDER_SITE_OTHER): Payer: BLUE CROSS/BLUE SHIELD | Admitting: *Deleted

## 2015-06-07 DIAGNOSIS — I480 Paroxysmal atrial fibrillation: Secondary | ICD-10-CM

## 2015-06-07 LAB — BASIC METABOLIC PANEL
BUN: 14 mg/dL (ref 6–23)
CO2: 32 mEq/L (ref 19–32)
Calcium: 9.1 mg/dL (ref 8.4–10.5)
Chloride: 106 mEq/L (ref 96–112)
Creatinine, Ser: 0.92 mg/dL (ref 0.40–1.50)
GFR: 92.29 mL/min (ref >60.00)
Glucose, Bld: 96 mg/dL (ref 70–99)
POTASSIUM: 3.6 meq/L (ref 3.5–5.1)
Sodium: 141 mEq/L (ref 135–145)

## 2015-06-12 ENCOUNTER — Ambulatory Visit: Payer: Self-pay | Admitting: Cardiology

## 2015-06-13 NOTE — Progress Notes (Signed)
      HPI: FU atrial fibrillation. Echocardiogram in September of 2013 showed normal LV function. Flecainide added for recurrent symptomatic atrial fibrillation but arrhythmia recurred. A followup nuclear study showed mild apical ischemia. Cardiac CT in November of 2013 showed no coronary disease and a calcium score of 0. Patient subsequently had atrial fibrillation ablation. ETT 11/15 normal. Since he was last seen the patient denies any dyspnea on exertion, orthopnea, PND, pedal edema, syncope or chest pain. Occasional brief flutter but no sustained palpitations.   Current Outpatient Prescriptions  Medication Sig Dispense Refill  . aspirin EC 81 MG tablet Take 1 tablet (81 mg total) by mouth daily. 90 tablet 3  . atorvastatin (LIPITOR) 40 MG tablet TAKE 1 TABLET DAILY 90 tablet 0  . diltiazem (TIAZAC) 300 MG 24 hr capsule TAKE 1 CAPSULE DAILY 90 capsule 0  . lisinopril (PRINIVIL,ZESTRIL) 20 MG tablet TAKE 1 TABLET DAILY 90 tablet 0  . oxyCODONE-acetaminophen (PERCOCET/ROXICET) 5-325 MG per tablet Take 1-2 tablets by mouth every 6 (six) hours as needed for severe pain. 20 tablet 0  . potassium chloride (K-DUR,KLOR-CON) 10 MEQ tablet TAKE 1 TABLET DAILY 30 tablet 1   No current facility-administered medications for this visit.     Past Medical History  Diagnosis Date  . Pure hypercholesterolemia   . Hypertension   . History of gallstones     LAPAROSCOPIC CHOLECYSTECTOMY 2005  . Paroxysmal atrial fibrillation     Past Surgical History  Procedure Laterality Date  . Laparoscopic cholecystectomy  07/04/2004 [facility]    DR. PAUL TOTH  . Laminectomy    . Tee without cardioversion  10/19/2012    Procedure: TRANSESOPHAGEAL ECHOCARDIOGRAM (TEE);  Surgeon: Pricilla Riffle, MD;  [facility] ENDOSCOPY;  Service: Cardiovascular;  Laterality: N/A;  . Atrial fibrillation ablation N/A 10/20/2012    Procedure: ATRIAL FIBRILLATION ABLATION;  Surgeon: Hillis Range, MD;  Location: [facility] CATH LAB;  Service:  Cardiovascular;  Laterality: N/A;    History   Social History  . Marital Status: Married    Spouse Name: N/A  . Number of Children: N/A  . Years of Education: N/A   Occupational History  . Not on file.   Social History Main Topics  . Smoking status: Never Smoker   . Smokeless tobacco: Not on file  . Alcohol Use: 0.0 oz/week    0 Standard drinks or equivalent per week     Comment: rare  . Drug Use: No  . Sexual Activity: Not on file   Other Topics Concern  . Not on file   Social History Narrative   Lives in [place].  Works at [employer] as an Art gallery manager    ROS: no fevers or chills, productive cough, hemoptysis, dysphasia, odynophagia, melena, hematochezia, dysuria, hematuria, rash, seizure activity, orthopnea, PND, pedal edema, claudication. Remaining systems are negative.  Physical Exam: Well-developed well-nourished in no acute distress.  Skin is warm and dry.  HEENT is normal.  Neck is supple.  Chest is clear to auscultation with normal expansion.  Cardiovascular exam is regular rate and rhythm.  Abdominal exam nontender or distended. No masses palpated. Extremities show no edema. neuro grossly intact  ECG sinus rhythm at a rate of 62. No ST changes.

## 2015-06-16 ENCOUNTER — Encounter: Payer: Self-pay | Admitting: Cardiology

## 2015-06-16 ENCOUNTER — Ambulatory Visit (INDEPENDENT_AMBULATORY_CARE_PROVIDER_SITE_OTHER): Payer: BLUE CROSS/BLUE SHIELD | Admitting: Cardiology

## 2015-06-16 VITALS — BP 122/84 | HR 62 | Ht 69.5 in | Wt 202.1 lb

## 2015-06-16 DIAGNOSIS — I4891 Unspecified atrial fibrillation: Secondary | ICD-10-CM

## 2015-06-16 DIAGNOSIS — E785 Hyperlipidemia, unspecified: Secondary | ICD-10-CM | POA: Diagnosis not present

## 2015-06-16 DIAGNOSIS — I10 Essential (primary) hypertension: Secondary | ICD-10-CM | POA: Diagnosis not present

## 2015-06-16 NOTE — Assessment & Plan Note (Signed)
Patient remains in sinus rhythm status post ablation. Continue aspirin and Cardizem.

## 2015-06-16 NOTE — Assessment & Plan Note (Signed)
Continue statin. 

## 2015-06-16 NOTE — Patient Instructions (Signed)
Your physician wants you to follow-up in: ONE YEAR WITH DR CRENSHAW You will receive a reminder letter in the mail two months in advance. If you don't receive a letter, please call our office to schedule the follow-up appointment.  

## 2015-06-16 NOTE — Assessment & Plan Note (Signed)
Blood pressure controlled. Continue present medications. 

## 2015-07-05 ENCOUNTER — Ambulatory Visit: Payer: Self-pay | Admitting: Internal Medicine

## 2015-07-12 ENCOUNTER — Other Ambulatory Visit: Payer: Self-pay | Admitting: Cardiology

## 2015-07-18 ENCOUNTER — Other Ambulatory Visit: Payer: Self-pay | Admitting: Cardiology

## 2015-07-20 ENCOUNTER — Other Ambulatory Visit: Payer: Self-pay | Admitting: Cardiology

## 2016-07-06 ENCOUNTER — Other Ambulatory Visit: Payer: Self-pay | Admitting: Cardiology

## 2016-07-12 ENCOUNTER — Other Ambulatory Visit: Payer: Self-pay | Admitting: Cardiology

## 2016-08-15 ENCOUNTER — Other Ambulatory Visit: Payer: Self-pay | Admitting: Cardiology

## 2016-09-13 ENCOUNTER — Other Ambulatory Visit: Payer: Self-pay | Admitting: Cardiology

## 2016-09-14 ENCOUNTER — Other Ambulatory Visit: Payer: Self-pay | Admitting: Cardiology

## 2016-09-16 NOTE — Telephone Encounter (Signed)
Rx request sent to pharmacy.  

## 2016-10-07 ENCOUNTER — Other Ambulatory Visit: Payer: Self-pay | Admitting: Cardiology

## 2016-10-07 NOTE — Telephone Encounter (Signed)
REFILL 

## 2016-10-15 ENCOUNTER — Other Ambulatory Visit: Payer: Self-pay | Admitting: Cardiology

## 2016-10-15 NOTE — Telephone Encounter (Signed)
Rx(s) sent to pharmacy electronically.  

## 2016-11-09 ENCOUNTER — Other Ambulatory Visit: Payer: Self-pay | Admitting: Cardiology

## 2016-11-30 DIAGNOSIS — J111 Influenza due to unidentified influenza virus with other respiratory manifestations: Secondary | ICD-10-CM | POA: Diagnosis not present

## 2016-11-30 DIAGNOSIS — J329 Chronic sinusitis, unspecified: Secondary | ICD-10-CM | POA: Diagnosis not present

## 2016-12-04 NOTE — Progress Notes (Signed)
HPI: FU atrial fibrillation. Echocardiogram in September of 2013 showed normal LV function. Flecainide added for recurrent symptomatic atrial fibrillation but arrhythmia recurred. A followup nuclear study showed mild apical ischemia. Cardiac CT in November of 2013 showed no coronary disease and a calcium score of 0. Patient subsequently had atrial fibrillation ablation. ETT 11/15 normal. Since he was last seen patient has noticed occasional pain in his left upper chest. It is described as a tight discomfort. It increases with certain arm movements. It is not related to exertion. No associated symptoms. He denies dyspnea on exertion, orthopnea, PND, pedal edema or exertional chest pain. Occasional brief flutter but no sustained palpitations.  Current Outpatient Prescriptions  Medication Sig Dispense Refill  . aspirin EC 81 MG tablet Take 1 tablet (81 mg total) by mouth daily. 90 tablet 3  . atorvastatin (LIPITOR) 40 MG tablet Take 1 tablet (40 mg total) by mouth daily at 6 PM. NEED OV. 30 tablet 0  . diltiazem (TIAZAC) 300 MG 24 hr capsule TAKE 1 CAPSULE DAILY (NEEDS TO CONTACT OFFICE TO SCHEDULE APPOINTMENT WITH DR Jens Som FOR FUTURE REFILLS. PHONE [phone]) 60 capsule 0  . lisinopril (PRINIVIL,ZESTRIL) 40 MG tablet Take 40 mg by mouth daily.     Marland Kitchen oxyCODONE-acetaminophen (PERCOCET/ROXICET) 5-325 MG per tablet Take 1-2 tablets by mouth every 6 (six) hours as needed for severe pain. 20 tablet 0  . potassium chloride (K-DUR,KLOR-CON) 10 MEQ tablet Take 1 tablet (10 mEq total) by mouth daily. NEEDS APPOINTMENT FOR FUTURE REFILLS 30 tablet 0   No current facility-administered medications for this visit.      Past Medical History:  Diagnosis Date  . History of gallstones    LAPAROSCOPIC CHOLECYSTECTOMY 2005  . Hypertension   . Paroxysmal atrial fibrillation (HCC)   . Pure hypercholesterolemia     Past Surgical History:  Procedure Laterality Date  . ATRIAL FIBRILLATION ABLATION N/A  10/20/2012   Procedure: ATRIAL FIBRILLATION ABLATION;  Surgeon: Hillis Range, MD;  [facility] CATH LAB;  Service: Cardiovascular;  Laterality: N/A;  . LAMINECTOMY    . LAPAROSCOPIC CHOLECYSTECTOMY  07/04/2004 [facility]   DR. PAUL TOTH  . TEE WITHOUT CARDIOVERSION  10/19/2012   Procedure: TRANSESOPHAGEAL ECHOCARDIOGRAM (TEE);  Surgeon: Pricilla Riffle, MD;  Location: [facility] ENDOSCOPY;  Service: Cardiovascular;  Laterality: N/A;    Social History   Social History  . Marital status: Married    Spouse name: N/A  . Number of children: N/A  . Years of education: N/A   Occupational History  . Not on file.   Social History Main Topics  . Smoking status: Never Smoker  . Smokeless tobacco: Never Used  . Alcohol use 0.0 oz/week     Comment: rare  . Drug use: No  . Sexual activity: Not on file   Other Topics Concern  . Not on file   Social History Narrative   Lives in [place].  Works at [employer] as an Art gallery manager    Family History  Problem Relation Age of Onset  . Hypertension Father   . Heart attack Paternal Grandfather 64    ROS: no fevers or chills, productive cough, hemoptysis, dysphasia, odynophagia, melena, hematochezia, dysuria, hematuria, rash, seizure activity, orthopnea, PND, pedal edema, claudication. Remaining systems are negative.  Physical Exam: Well-developed well-nourished in no acute distress.  Skin is warm and dry.  HEENT is normal.  Neck is supple. No bruits Chest is clear to auscultation with normal expansion.  Cardiovascular exam is regular rate  and rhythm.  Abdominal exam nontender or distended. No masses palpated. Extremities show no edema. neuro grossly intact  ECG-Sinus rhythm at a rate of 69. No ST changes.  A/P  1 Paroxysmal atrial fibrillation-patient remains in sinus rhythm today. Continue Cardizem.  2 hypertension-blood pressure elevated. Lisinopril recently increased. I have asked him to increase his Cardizem to 360 mg daily and we will follow and adjust  regimen as needed.  3 hyperlipidemia-continue statin. Patient had laboratories drawn recently by primary care. His bilirubin was 0.9 with alkaline phosphatase 94, SGOT 57 and SGPT 22. Cholesterol 138 and LDL 80.  4 chest pain-symptoms are likely musculoskeletal. Electrocardiogram shows no ST changes. We will not pursue further ischemia evaluation at this point.  Olga MillersBrian Crenshaw, MD

## 2016-12-06 DIAGNOSIS — Z9889 Other specified postprocedural states: Secondary | ICD-10-CM | POA: Diagnosis not present

## 2016-12-06 DIAGNOSIS — R0789 Other chest pain: Secondary | ICD-10-CM | POA: Diagnosis not present

## 2016-12-06 DIAGNOSIS — E785 Hyperlipidemia, unspecified: Secondary | ICD-10-CM | POA: Diagnosis not present

## 2016-12-06 DIAGNOSIS — I10 Essential (primary) hypertension: Secondary | ICD-10-CM | POA: Diagnosis not present

## 2016-12-09 DIAGNOSIS — E785 Hyperlipidemia, unspecified: Secondary | ICD-10-CM | POA: Diagnosis not present

## 2016-12-09 DIAGNOSIS — I10 Essential (primary) hypertension: Secondary | ICD-10-CM | POA: Diagnosis not present

## 2016-12-11 ENCOUNTER — Encounter (INDEPENDENT_AMBULATORY_CARE_PROVIDER_SITE_OTHER): Payer: Self-pay

## 2016-12-11 ENCOUNTER — Encounter: Payer: Self-pay | Admitting: Cardiology

## 2016-12-11 ENCOUNTER — Ambulatory Visit (INDEPENDENT_AMBULATORY_CARE_PROVIDER_SITE_OTHER): Payer: BLUE CROSS/BLUE SHIELD | Admitting: Cardiology

## 2016-12-11 VITALS — BP 159/96 | HR 69 | Ht 69.5 in | Wt 210.1 lb

## 2016-12-11 DIAGNOSIS — I10 Essential (primary) hypertension: Secondary | ICD-10-CM | POA: Diagnosis not present

## 2016-12-11 DIAGNOSIS — I4891 Unspecified atrial fibrillation: Secondary | ICD-10-CM | POA: Diagnosis not present

## 2016-12-11 DIAGNOSIS — R0789 Other chest pain: Secondary | ICD-10-CM | POA: Diagnosis not present

## 2016-12-11 MED ORDER — DILTIAZEM HCL ER BEADS 360 MG PO CP24: 360.0000 mg | ORAL_CAPSULE | Freq: Every day | ORAL | 12 refills | Status: DC

## 2016-12-11 NOTE — Patient Instructions (Signed)
Medication Instructions:   INCREASE DILTIAZEM TO 360 MG ONCE DAILY  Follow-Up:  Your physician recommends that you schedule a follow-up appointment in: 8 WEEKS WITH DR Jens SomRENSHAW

## 2016-12-16 ENCOUNTER — Other Ambulatory Visit: Payer: Self-pay | Admitting: Cardiology

## 2016-12-17 NOTE — Telephone Encounter (Signed)
Rx(s) sent to pharmacy electronically.  

## 2016-12-31 DIAGNOSIS — M62838 Other muscle spasm: Secondary | ICD-10-CM | POA: Diagnosis not present

## 2016-12-31 DIAGNOSIS — M542 Cervicalgia: Secondary | ICD-10-CM | POA: Diagnosis not present

## 2017-01-12 ENCOUNTER — Other Ambulatory Visit: Payer: Self-pay | Admitting: Cardiology

## 2017-01-31 NOTE — Progress Notes (Signed)
HPI: FU atrial fibrillation. Echocardiogram in September of 2013 showed normal LV function. Flecainide added for recurrent symptomatic atrial fibrillation but arrhythmia recurred. A followup nuclear study showed mild apical ischemia. Cardiac CT in November of 2013 showed no coronary disease and a calcium score of 0. Patient subsequently had atrial fibrillation ablation. ETT 11/15 normal. Since he was last seen the patient denies any dyspnea on exertion, orthopnea, PND, pedal edema, palpitations, syncope or chest pain.   Current Outpatient Prescriptions  Medication Sig Dispense Refill  . aspirin EC 81 MG tablet Take 1 tablet (81 mg total) by mouth daily. 90 tablet 3  . atorvastatin (LIPITOR) 40 MG tablet Take 1 tablet (40 mg total) by mouth daily at 6 PM. NEED OV. 30 tablet 0  . diltiazem (TIAZAC) 360 MG 24 hr capsule Take 1 capsule (360 mg total) by mouth daily. 30 capsule 12  . lisinopril (PRINIVIL,ZESTRIL) 40 MG tablet Take 40 mg by mouth daily.     . potassium chloride (K-DUR) 10 MEQ tablet Take 1 tablet (10 mEq total) by mouth daily. 30 tablet 10   No current facility-administered medications for this visit.      Past Medical History:  Diagnosis Date  . History of gallstones    LAPAROSCOPIC CHOLECYSTECTOMY 2005  . Hypertension   . Paroxysmal atrial fibrillation (HCC)   . Pure hypercholesterolemia     Past Surgical History:  Procedure Laterality Date  . ATRIAL FIBRILLATION ABLATION N/A 10/20/2012   Procedure: ATRIAL FIBRILLATION ABLATION;  Surgeon: Hillis RangeJames Allred, MD;  [facility];  Service: Cardiovascular;  Laterality: N/A;  . LAMINECTOMY    . LAPAROSCOPIC CHOLECYSTECTOMY  07/04/2004 [facility]   DR. PAUL TOTH  . TEE WITHOUT CARDIOVERSION  10/19/2012   Procedure: TRANSESOPHAGEAL ECHOCARDIOGRAM (TEE);  Surgeon: Pricilla RifflePaula V Ross, MD;  [facility];  Service: Cardiovascular;  Laterality: N/A;    Social History   Social History  . Marital status: Married   Spouse name: N/A  . Number of children: N/A  . Years of education: N/A   Occupational History  . Not on file.   Social History Main Topics  . Smoking status: Never Smoker  . Smokeless tobacco: Never Used  . Alcohol use 0.0 oz/week     Comment: rare  . Drug use: No  . Sexual activity: Not on file   Other Topics Concern  . Not on file   Social History Narrative   [place]  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 Hypertension Father   . Heart attack Paternal Grandfather 64    ROS: no fevers or chills, productive cough, hemoptysis, dysphasia, odynophagia, melena, hematochezia, dysuria, hematuria, rash, seizure activity, orthopnea, PND, pedal edema, claudication. Remaining systems are negative.  Physical Exam: Well-developed well-nourished in no acute distress.  Skin is warm and dry.  HEENT is normal.  Neck is supple. No bruits Chest is clear to auscultation with normal expansion.  Cardiovascular exam is regular rate and rhythm.  Abdominal exam nontender or distended. No masses palpated. Extremities show no edema. neuro grossly intact   A/P  1 Paroxysmal atrial fibrillation-patient remains in sinus rhythm s/p ablation. Continue Cardizem.  2 hypertension-blood pressure elevated. Add chlorthalidone 12.5 mg daily. Check potassium and renal function in 1 week. Adjust regimen as needed.  3 hyperlipidemia-continue statin.  4 chest pain-no recurrent symptoms. No further ischemia evaluation.   Olga MillersBrian Kaylynne Andres, MD

## 2017-02-03 DIAGNOSIS — E785 Hyperlipidemia, unspecified: Secondary | ICD-10-CM | POA: Diagnosis not present

## 2017-02-03 DIAGNOSIS — I10 Essential (primary) hypertension: Secondary | ICD-10-CM | POA: Diagnosis not present

## 2017-02-03 DIAGNOSIS — M542 Cervicalgia: Secondary | ICD-10-CM | POA: Diagnosis not present

## 2017-02-03 DIAGNOSIS — R739 Hyperglycemia, unspecified: Secondary | ICD-10-CM | POA: Diagnosis not present

## 2017-02-03 DIAGNOSIS — R748 Abnormal levels of other serum enzymes: Secondary | ICD-10-CM | POA: Diagnosis not present

## 2017-02-05 ENCOUNTER — Ambulatory Visit (INDEPENDENT_AMBULATORY_CARE_PROVIDER_SITE_OTHER): Payer: BLUE CROSS/BLUE SHIELD | Admitting: Cardiology

## 2017-02-05 ENCOUNTER — Encounter (INDEPENDENT_AMBULATORY_CARE_PROVIDER_SITE_OTHER): Payer: Self-pay

## 2017-02-05 ENCOUNTER — Encounter: Payer: Self-pay | Admitting: Cardiology

## 2017-02-05 VITALS — BP 141/90 | HR 81 | Ht 69.5 in | Wt 214.8 lb

## 2017-02-05 DIAGNOSIS — I4891 Unspecified atrial fibrillation: Secondary | ICD-10-CM

## 2017-02-05 DIAGNOSIS — I10 Essential (primary) hypertension: Secondary | ICD-10-CM

## 2017-02-05 DIAGNOSIS — E7800 Pure hypercholesterolemia, unspecified: Secondary | ICD-10-CM

## 2017-02-05 MED ORDER — CHLORTHALIDONE 25 MG PO TABS: 12.5000 mg | ORAL_TABLET | Freq: Every day | ORAL | 3 refills | Status: DC

## 2017-02-05 NOTE — Patient Instructions (Signed)
Medication Instructions:   START CHLORTHALIDONE 12.5 MG ONCE DAILY= 1/2 OF THE 25 MG TABLET ONCE DAILY   Labwork:  Your physician recommends that you return for lab work in: ONE WEEK-3RD FLOOR IN THE [place] OFFICE  Follow-Up:  Your physician wants you to follow-up in: 6 MONTHS WITH DR Jens SomRENSHAW You will receive a reminder letter in the mail two months in advance. If you don't receive a letter, please call our office to schedule the follow-up appointment.   If you need a refill on your cardiac medications before your next appointment, please call your pharmacy.

## 2017-02-12 ENCOUNTER — Telehealth: Payer: Self-pay | Admitting: Cardiology

## 2017-02-12 ENCOUNTER — Other Ambulatory Visit: Payer: Self-pay | Admitting: *Deleted

## 2017-02-12 DIAGNOSIS — I10 Essential (primary) hypertension: Secondary | ICD-10-CM | POA: Diagnosis not present

## 2017-02-12 MED ORDER — ATORVASTATIN CALCIUM 40 MG PO TABS: 40.0000 mg | ORAL_TABLET | Freq: Every day | ORAL | 0 refills | Status: DC

## 2017-02-13 LAB — BASIC METABOLIC PANEL
BUN/Creatinine Ratio: 17 (ref 9–20)
BUN: 17 mg/dL (ref 6–24)
CALCIUM: 9.8 mg/dL (ref 8.7–10.2)
CHLORIDE: 99 mmol/L (ref 96–106)
CO2: 28 mmol/L (ref 18–29)
Creatinine, Ser: 0.99 mg/dL (ref 0.76–1.27)
GFR calc non Af Amer: 87 mL/min/{1.73_m2} (ref >59)
GFR, EST AFRICAN AMERICAN: 101 mL/min/{1.73_m2} (ref >59)
Glucose: 88 mg/dL (ref 65–99)
Potassium: 3.6 mmol/L (ref 3.5–5.2)
Sodium: 141 mmol/L (ref 134–144)

## 2017-02-26 ENCOUNTER — Other Ambulatory Visit: Payer: Self-pay

## 2017-02-26 DIAGNOSIS — I10 Essential (primary) hypertension: Secondary | ICD-10-CM

## 2017-02-26 DIAGNOSIS — I4891 Unspecified atrial fibrillation: Secondary | ICD-10-CM

## 2017-02-26 MED ORDER — DILTIAZEM HCL ER BEADS 360 MG PO CP24: 360.0000 mg | ORAL_CAPSULE | Freq: Every day | ORAL | 3 refills | Status: DC

## 2017-02-26 MED ORDER — CHLORTHALIDONE 25 MG PO TABS: 12.5000 mg | ORAL_TABLET | Freq: Every day | ORAL | 3 refills | Status: DC

## 2017-02-26 MED ORDER — POTASSIUM CHLORIDE ER 10 MEQ PO TBCR: 10.0000 meq | EXTENDED_RELEASE_TABLET | Freq: Every day | ORAL | 3 refills | Status: DC

## 2017-02-26 MED ORDER — ATORVASTATIN CALCIUM 40 MG PO TABS: 40.0000 mg | ORAL_TABLET | Freq: Every day | ORAL | 3 refills | Status: DC

## 2017-02-26 NOTE — Telephone Encounter (Signed)
Rx(s) sent to pharmacy electronically.  

## 2017-03-31 ENCOUNTER — Telehealth: Payer: Self-pay | Admitting: Cardiology

## 2017-03-31 NOTE — Telephone Encounter (Signed)
New message        *STAT* If patient is at the pharmacy, call can be transferred to refill team.   1. Which medications need to be refilled? (please list name of each medication and dose if known) atorvastatin 40mg  and lisinopril 40mg  2. Which pharmacy/location (including street and city if local pharmacy) is medication to be sent to? [facility] AND [facility]  3. Do they need a 30 day or 90 day supply? [facility] day to [facility] and [facility].  Call if there is a problem

## 2017-04-01 ENCOUNTER — Other Ambulatory Visit: Payer: Self-pay | Admitting: *Deleted

## 2017-04-01 MED ORDER — LISINOPRIL 40 MG PO TABS: 40.0000 mg | ORAL_TABLET | Freq: Every day | ORAL | 0 refills | Status: DC

## 2017-04-01 MED ORDER — ATORVASTATIN CALCIUM 40 MG PO TABS: 40.0000 mg | ORAL_TABLET | Freq: Every day | ORAL | 0 refills | Status: DC

## 2017-04-01 MED ORDER — ATORVASTATIN CALCIUM 40 MG PO TABS: 40.0000 mg | ORAL_TABLET | Freq: Every day | ORAL | 3 refills | Status: DC

## 2017-04-01 MED ORDER — LISINOPRIL 40 MG PO TABS: 40.0000 mg | ORAL_TABLET | Freq: Every day | ORAL | 3 refills | Status: DC

## 2017-04-14 ENCOUNTER — Telehealth: Payer: Self-pay | Admitting: Cardiology

## 2017-04-14 NOTE — Telephone Encounter (Signed)
Spoke with pt, he was recently on a motorcycle trip to [place] and got very dehydrated. He is trying to rehydrate and his bp is running a little low. He is getting dizziness when standing from a sitting or squatting position. Advised patient to not take the chlorthalidone today and to take 1/2 of the lisinopril dose. He will take the diltiazem. He will continue to monitor his bp and symptoms and add medications back as his bp and dizziness allows. He will call by later in the week if continues to have problems. Pt agreed with this plan.

## 2017-04-14 NOTE — Telephone Encounter (Signed)
New message    Pt c/o BP issue: STAT if pt c/o blurred vision, one-sided weakness or slurred speech  1. What are your last 5 BP readings? 105/73, 121/77  2. Are you having any other symptoms (ex. Dizziness, headache, blurred vision, passed out)? Feeling tired, pt states he has had some dizziness.  3. What is your BP issue? Pt states that his bp is low in the morning. He wants to talk to RN about his medication.

## 2017-05-01 DIAGNOSIS — M542 Cervicalgia: Secondary | ICD-10-CM | POA: Diagnosis not present

## 2017-05-01 DIAGNOSIS — I10 Essential (primary) hypertension: Secondary | ICD-10-CM | POA: Diagnosis not present

## 2017-05-01 DIAGNOSIS — E785 Hyperlipidemia, unspecified: Secondary | ICD-10-CM | POA: Diagnosis not present

## 2017-10-09 ENCOUNTER — Encounter: Payer: Self-pay | Admitting: Cardiology

## 2017-10-09 ENCOUNTER — Ambulatory Visit: Payer: BLUE CROSS/BLUE SHIELD | Admitting: Cardiology

## 2017-10-09 VITALS — BP 132/80 | HR 69 | Ht 70.0 in | Wt 214.0 lb

## 2017-10-09 DIAGNOSIS — I480 Paroxysmal atrial fibrillation: Secondary | ICD-10-CM | POA: Diagnosis not present

## 2017-10-09 DIAGNOSIS — R072 Precordial pain: Secondary | ICD-10-CM

## 2017-10-09 DIAGNOSIS — R5383 Other fatigue: Secondary | ICD-10-CM | POA: Diagnosis not present

## 2017-10-09 DIAGNOSIS — E7800 Pure hypercholesterolemia, unspecified: Secondary | ICD-10-CM | POA: Diagnosis not present

## 2017-10-09 NOTE — Progress Notes (Signed)
HPI: FU atrial fibrillation. Echocardiogram in September of 2013 showed normal LV function. Flecainide added for recurrent symptomatic atrial fibrillation but arrhythmia recurred. A followup nuclear study showed mild apical ischemia. Cardiac CT in November of 2013 showed no coronary disease and a calcium score of 0. Patient subsequently had atrial fibrillation ablation. ETT 11/15 normal. Since he was last seen patient states that over the last 24 hours he has had increased fatigue. He has noticed an occasional flutter. He has had pain in his left upper chest and shoulder that increases with certain movements. He otherwise has not had exertional chest pain, dyspnea or syncope. He was added to my schedule for the above complaints.   Current Outpatient Medications  Medication Sig Dispense Refill  . aspirin EC 81 MG tablet Take 1 tablet (81 mg total) by mouth daily. 90 tablet 3  . atorvastatin (LIPITOR) 40 MG tablet Take 1 tablet (40 mg total) by mouth daily at 6 PM. 30 tablet 0  . chlorthalidone (HYGROTON) 25 MG tablet Take 0.5 tablets (12.5 mg total) by mouth daily. 45 tablet 3  . diltiazem (TIAZAC) 360 MG 24 hr capsule Take 1 capsule (360 mg total) by mouth daily. 90 capsule 3  . lisinopril (PRINIVIL,ZESTRIL) 40 MG tablet Take 1 tablet (40 mg total) by mouth daily. 30 tablet 0  . potassium chloride (K-DUR) 10 MEQ tablet Take 1 tablet (10 mEq total) by mouth daily. 90 tablet 3   No current facility-administered medications for this visit.      Past Medical History:  Diagnosis Date  . History of gallstones    LAPAROSCOPIC CHOLECYSTECTOMY 2005  . Hypertension   . Paroxysmal atrial fibrillation (HCC)   . Pure hypercholesterolemia     Past Surgical History:  Procedure Laterality Date  . ATRIAL FIBRILLATION ABLATION N/A 10/20/2012   Procedure: ATRIAL FIBRILLATION ABLATION;  Surgeon: Hillis RangeJames Allred, MD;  [facility];  Service: Cardiovascular;  Laterality: N/A;  . LAMINECTOMY      . LAPAROSCOPIC CHOLECYSTECTOMY  07/04/2004 [facility]   DR. PAUL TOTH  . TEE WITHOUT CARDIOVERSION  10/19/2012   Procedure: TRANSESOPHAGEAL ECHOCARDIOGRAM (TEE);  Surgeon: Pricilla RifflePaula V Ross, MD;  [facility];  Service: Cardiovascular;  Laterality: N/A;    Social History   Socioeconomic History  . Marital status: Married    Spouse name: Not on file  . Number of children: Not on file  . Years of education: Not on file  . Highest education level: Not on file  Social Needs  . Financial resource strain: Not on file  . Food insecurity - worry: Not on file  . Food insecurity - inability: Not on file  . Transportation needs - medical: Not on file  . Transportation needs - non-medical: Not on file  Occupational History  . Not on file  Tobacco Use  . Smoking status: Never Smoker  . Smokeless tobacco: Never Used  Substance and Sexual Activity  . Alcohol use: Yes    Alcohol/week: 0.0 oz    Comment: rare  . Drug use: No  . Sexual activity: Not on file  Other Topics Concern  . Not on file  Social History Narrative   [place]ives in [place].  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 Hypertension Father   . Heart attack Paternal Grandfather 64    ROS: no fevers or chills, productive cough, hemoptysis, dysphasia, odynophagia, melena, hematochezia, dysuria, hematuria, rash,  seizure activity, orthopnea, PND, pedal edema, claudication. Remaining systems are negative.  Physical Exam: Well-developed well-nourished in no acute distress.  Skin is warm and dry.  HEENT is normal.  Neck is supple.  Chest is clear to auscultation with normal expansion.  Cardiovascular exam is regular rate and rhythm.  Abdominal exam nontender or distended. No masses palpated. Extremities show no edema. neuro grossly intact  ECG- sinus rhythm at a rate of 69. No significant ST changes.personally reviewed  A/P  1 paroxysmal atrial fibrillation-status post ablation.  Patient remains in sinus rhythm.Continue Cardizem for rate control if atrial fibrillation recurs.  2 chest pain-symptoms are atypical. Will arrange exercise treadmill for risk stratification.  3 hypertension-blood pressure is controlled. Continue present medications. Check potassium and renal function.  4 hyperlipidemia-continue statin. Check lipids and liver.  5 fatigue-check TSH and hemoglobin.  Olga MillersBrian Massie Cogliano, MD

## 2017-10-09 NOTE — Patient Instructions (Signed)
Medication Instructions:   NO CHANGE  Labwork:  Your physician recommends that you RETURN FOR LAB WORK PRIOR TO EATING  Testing/Procedures:  Your physician has requested that you have an exercise tolerance test. For further information please visit [email or web address]. Please also follow instruction sheet, as given.    Follow-Up:  Your physician wants you to follow-up in: 6 MONTHS WITH DR Jens SomRENSHAW You will receive a reminder letter in the mail two months in advance. If you don't receive a letter, please call our office to schedule the follow-up appointment.   If you need a refill on your cardiac medications before your next appointment, please call your pharmacy.

## 2017-10-10 DIAGNOSIS — E7800 Pure hypercholesterolemia, unspecified: Secondary | ICD-10-CM | POA: Diagnosis not present

## 2017-10-10 DIAGNOSIS — R5383 Other fatigue: Secondary | ICD-10-CM | POA: Diagnosis not present

## 2017-10-10 LAB — CBC
HEMATOCRIT: 40.6 % (ref 37.5–51.0)
Hemoglobin: 14.4 g/dL (ref 13.0–17.7)
MCH: 31.3 pg (ref 26.6–33.0)
MCHC: 35.5 g/dL (ref 31.5–35.7)
MCV: 88 fL (ref 79–97)
Platelets: 209 10*3/uL (ref 150–379)
RBC: 4.6 x10E6/uL (ref 4.14–5.80)
RDW: 13 % (ref 12.3–15.4)
WBC: 5.6 10*3/uL (ref 3.4–10.8)

## 2017-10-10 LAB — LIPID PANEL
CHOLESTEROL TOTAL: 139 mg/dL (ref 100–199)
Chol/HDL Ratio: 3.1 ratio (ref 0.0–5.0)
HDL: 45 mg/dL (ref >39)
LDL Calculated: 75 mg/dL (ref 0–99)
TRIGLYCERIDES: 96 mg/dL (ref 0–149)
VLDL Cholesterol Cal: 19 mg/dL (ref 5–40)

## 2017-10-10 LAB — COMPREHENSIVE METABOLIC PANEL
A/G RATIO: 1.9 (ref 1.2–2.2)
ALBUMIN: 4.5 g/dL (ref 3.5–5.5)
ALT: 42 IU/L (ref 0–44)
AST: 21 IU/L (ref 0–40)
Alkaline Phosphatase: 97 IU/L (ref 39–117)
BILIRUBIN TOTAL: 0.8 mg/dL (ref 0.0–1.2)
BUN / CREAT RATIO: 12 (ref 9–20)
BUN: 13 mg/dL (ref 6–24)
CHLORIDE: 99 mmol/L (ref 96–106)
CO2: 29 mmol/L (ref 20–29)
Calcium: 9.5 mg/dL (ref 8.7–10.2)
Creatinine, Ser: 1.09 mg/dL (ref 0.76–1.27)
GFR calc non Af Amer: 77 mL/min/{1.73_m2} (ref >59)
GFR, EST AFRICAN AMERICAN: 89 mL/min/{1.73_m2} (ref >59)
GLUCOSE: 95 mg/dL (ref 65–99)
Globulin, Total: 2.4 g/dL (ref 1.5–4.5)
Potassium: 3.6 mmol/L (ref 3.5–5.2)
Sodium: 141 mmol/L (ref 134–144)
TOTAL PROTEIN: 6.9 g/dL (ref 6.0–8.5)

## 2017-10-10 LAB — TSH: TSH: 1.5 u[IU]/mL (ref 0.450–4.500)

## 2017-10-13 ENCOUNTER — Telehealth: Payer: Self-pay | Admitting: Cardiology

## 2017-10-13 NOTE — Telephone Encounter (Signed)
Patient made aware of his results and verbalized his understanding.  Notes recorded by Kevin Holmes, Kevin S, MD on 10/10/2017 at 6:21 PM EST Labs are normal Kevin MillersBrian Holmes'

## 2017-10-13 NOTE — Telephone Encounter (Signed)
    Patient calling for lab results 

## 2017-10-14 ENCOUNTER — Encounter: Payer: Self-pay | Admitting: *Deleted

## 2017-10-16 ENCOUNTER — Telehealth (HOSPITAL_COMMUNITY): Payer: Self-pay

## 2017-10-16 NOTE — Telephone Encounter (Signed)
Detailed instructions not left on voicemail. DPR on file but, not up to date. Form is from 2014.  Encounter complete.

## 2017-10-17 ENCOUNTER — Telehealth (HOSPITAL_COMMUNITY): Payer: Self-pay

## 2017-10-17 DIAGNOSIS — M25562 Pain in left knee: Secondary | ICD-10-CM | POA: Diagnosis not present

## 2017-10-17 NOTE — Telephone Encounter (Signed)
Encounter complete. 

## 2017-10-21 ENCOUNTER — Inpatient Hospital Stay (HOSPITAL_COMMUNITY)
Admission: RE | Admit: 2017-10-21 | Payer: BLUE CROSS/BLUE SHIELD | Source: Ambulatory Visit | Attending: Cardiology | Admitting: Cardiology

## 2017-10-23 ENCOUNTER — Telehealth (HOSPITAL_COMMUNITY): Payer: Self-pay

## 2017-10-23 NOTE — Telephone Encounter (Signed)
Encounter complete. 

## 2017-10-28 ENCOUNTER — Ambulatory Visit (HOSPITAL_COMMUNITY)
Admission: RE | Admit: 2017-10-28 | Discharge: 2017-10-28 | Disposition: A | Discharge status: Home / Self Care | Payer: BLUE CROSS/BLUE SHIELD | Source: Ambulatory Visit | Attending: Cardiovascular Disease | Admitting: Cardiovascular Disease

## 2017-10-28 DIAGNOSIS — R072 Precordial pain: Secondary | ICD-10-CM | POA: Insufficient documentation

## 2017-10-28 LAB — EXERCISE TOLERANCE TEST
CHL CUP RESTING HR STRESS: 59 {beats}/min
CSEPEW: 11.7 METS
CSEPPHR: 160 {beats}/min
Exercise duration (min): 10 min
Exercise duration (sec): 0 s
MPHR: 167 {beats}/min
Percent HR: 95 %
RPE: 18

## 2017-11-08 DIAGNOSIS — M25562 Pain in left knee: Secondary | ICD-10-CM | POA: Diagnosis not present

## 2017-11-10 DIAGNOSIS — M25562 Pain in left knee: Secondary | ICD-10-CM | POA: Diagnosis not present

## 2017-12-29 DIAGNOSIS — J019 Acute sinusitis, unspecified: Secondary | ICD-10-CM | POA: Diagnosis not present

## 2018-01-29 ENCOUNTER — Other Ambulatory Visit: Payer: Self-pay | Admitting: *Deleted

## 2018-01-29 DIAGNOSIS — I4891 Unspecified atrial fibrillation: Secondary | ICD-10-CM

## 2018-01-29 DIAGNOSIS — I10 Essential (primary) hypertension: Secondary | ICD-10-CM

## 2018-01-29 MED ORDER — POTASSIUM CHLORIDE ER 10 MEQ PO TBCR: 10.0000 meq | EXTENDED_RELEASE_TABLET | Freq: Every day | ORAL | 0 refills | Status: DC

## 2018-01-29 MED ORDER — DILTIAZEM HCL ER BEADS 360 MG PO CP24
360.0000 mg | ORAL_CAPSULE | Freq: Every day | ORAL | 0 refills | Status: DC
Start: 2018-01-29 — End: 2018-04-29

## 2018-01-29 MED ORDER — CHLORTHALIDONE 25 MG PO TABS: 12.5000 mg | ORAL_TABLET | Freq: Every day | ORAL | 0 refills | Status: DC

## 2018-01-29 NOTE — Telephone Encounter (Signed)
Rx has been sent to the pharmacy electronically. ° °

## 2018-03-04 ENCOUNTER — Other Ambulatory Visit: Payer: Self-pay | Admitting: Cardiology

## 2018-03-04 NOTE — Telephone Encounter (Signed)
REFILL 

## 2018-03-23 DIAGNOSIS — I959 Hypotension, unspecified: Secondary | ICD-10-CM | POA: Diagnosis not present

## 2018-03-23 DIAGNOSIS — E876 Hypokalemia: Secondary | ICD-10-CM | POA: Diagnosis not present

## 2018-03-23 DIAGNOSIS — R5383 Other fatigue: Secondary | ICD-10-CM | POA: Diagnosis not present

## 2018-03-25 ENCOUNTER — Telehealth: Payer: Self-pay | Admitting: Cardiology

## 2018-03-25 NOTE — Telephone Encounter (Signed)
New message:       Pt is calling to discuss some low potassium results given to him by his pcp but would like to let Dr know as well

## 2018-03-25 NOTE — Telephone Encounter (Signed)
Returned the call to the patient. He stated that while on his way to [place] he got sick with nausea and diarrhea. He ended up getting dehydrated and had to go to the ED. When he got home he saw his PCP had labs drawn and an EKG. The EKG was normal. Potassium was 2.8. The PCP was concerned also that the patient needed a decrease in his blood pressure medication due to his recent weight loss of 22 pounds.   The PCP has: Decreased Lisinopril to 20 mg daily Stopped the Chlorthalidone Increased Potassium to 30 meq for three days, then 20 meq for 2 days.   He was instructed to follow up with Dr. Jens Som within the new few weeks. The earliest appointment available was July 3rd. The patient would like to know if he needs to be seen earlier.

## 2018-03-25 NOTE — Telephone Encounter (Signed)
paov Kevin Holmes  

## 2018-03-25 NOTE — Telephone Encounter (Signed)
Spoke with pt, Follow up scheduled  

## 2018-04-01 DIAGNOSIS — E876 Hypokalemia: Secondary | ICD-10-CM | POA: Diagnosis not present

## 2018-04-16 ENCOUNTER — Ambulatory Visit: Payer: BLUE CROSS/BLUE SHIELD | Admitting: Physician Assistant

## 2018-04-16 ENCOUNTER — Encounter: Payer: Self-pay | Admitting: Physician Assistant

## 2018-04-16 VITALS — BP 130/80 | HR 61 | Ht 70.0 in | Wt 205.2 lb

## 2018-04-16 DIAGNOSIS — I480 Paroxysmal atrial fibrillation: Secondary | ICD-10-CM | POA: Diagnosis not present

## 2018-04-16 DIAGNOSIS — E785 Hyperlipidemia, unspecified: Secondary | ICD-10-CM

## 2018-04-16 DIAGNOSIS — I10 Essential (primary) hypertension: Secondary | ICD-10-CM | POA: Diagnosis not present

## 2018-04-16 DIAGNOSIS — R0789 Other chest pain: Secondary | ICD-10-CM

## 2018-04-16 MED ORDER — LISINOPRIL 40 MG PO TABS: 40.0000 mg | ORAL_TABLET | Freq: Every day | ORAL | 5 refills | Status: DC

## 2018-04-16 NOTE — Patient Instructions (Signed)
Medication Instructions:  INCREASE LISINOPRIL TO 40MG  AT BEDTIME  If you need a refill on your cardiac medications before your next appointment, please call your pharmacy.   Thank you for choosing [facility]!!

## 2018-04-16 NOTE — Progress Notes (Addendum)
Cardiology Office Note    Date:  04/16/2018   ID:  IAAN OREGEL, DOB Aug 04, 1966, MRN [id number]  PCP:  Kevin Ladd, MD  Cardiologist:  Dr. Jens Som  Chief Complaint  Patient presents with  . Follow-up    seen for Dr. Jens Som.     History of Present Illness:  Kevin Holmes is a 52 y.o. male with past medical history of hypertension, paroxysmal atrial fibrillation and hyperlipidemia.  Echocardiogram in September 2013 showed normal LV function.  He has failed flecainide in the past with breakthrough atrial fibrillation.  Myoview showed mild apical ischemia.  Cardiac CT in November 2013 showed no coronary artery disease and a calcium score of 0.  Patient subsequently had atrial fibrillation ablation.  ETT in November 2015 was normal.  He was last seen by Dr. Jens Som on 10/09/2017 for atypical chest pain.  ETT performed on 10/28/2017 was negative without ischemia.  More recently, patient says he was on his way to [place] when he got sick with nausea and diarrhea.  He was seen in the local ED, EKG was normal, potassium was low at 2.8.  His primary care provider was concerned that patient needed to decrease his blood pressure medication due to recent weight loss of 22 pounds.  His lisinopril was decreased to 20 mg daily.  Chlorthalidone has been stopped.  Potassium increased to 30 meq for 3 days then 20 meq for 2 days.  Patient presents today for cardiology office visit.  He says he and his friends were riding motorcycles down to [name] when multiple members of the group started having nausea, vomiting and diarrhea.  He has the worst case of the mole.  He was seen in the ED at [facility] in [place] ([phone]).  He says his blood pressure was very low at the time.  He also had low potassium as well.  His family picked up both him and his motorcycle using a truck.  Upon his return, he was seen at his primary care doctor's office and was also noted the blood pressure  was in the 80s.  This was roughly 1 to 2 days after he returned.  His lisinopril was cut in half and chlorthalidone was discontinued.  In the past few days, his blood pressure has been creeping up into the 140s and 150s.  Starting yesterday, he resumed on the 40 mg daily of lisinopril.  Although the patient did lose 20 pounds recently, however it sounds like he contracted a GI virus and subsequently had low blood pressure for several days along with diarrhea and vomiting.  This likely explains both the hypokalemia and hypertension.  I do agree with discontinuation of hydrochlorothiazide to avoid significant dehydration.  Since he restarted on the higher dose of lisinopril, his blood pressure has been well controlled in the low 130s.  I recommended him to switch his lisinopril to nighttime while keeping the diltiazem at daytime.  He will obtain 2 blood pressure per day and will return for reassessment in 1 month with Dr. Jens Som.  If his blood pressure is stable at that time, I would recommend keep on the current medication.  He mentions he continued to have intermittent chest discomfort, this only occurs about once a week.  The degree and frequency of the left-sided chest discomfort has not changed compared to the last year.  Given the negative ETT in 2018, I would not recommend any further work-up unless frequency or duration of the  chest discomfort increase.    Past Medical History:  Diagnosis Date  . History of gallstones    LAPAROSCOPIC CHOLECYSTECTOMY 2005  . Hypertension   . Paroxysmal atrial fibrillation (HCC)   . Pure hypercholesterolemia     Past Surgical History:  Procedure Laterality Date  . ATRIAL FIBRILLATION ABLATION N/A 10/20/2012   Procedure: ATRIAL FIBRILLATION ABLATION;  Surgeon: Kevin Range, MD;  Location: [facility] CATH LAB;  Service: Cardiovascular;  Laterality: N/A;  . LAMINECTOMY    . LAPAROSCOPIC CHOLECYSTECTOMY  07/04/2004 [facility]   DR. PAUL TOTH  . TEE WITHOUT CARDIOVERSION   10/19/2012   Procedure: TRANSESOPHAGEAL ECHOCARDIOGRAM (TEE);  Surgeon: Kevin Riffle, MD;  Location: [facility] ENDOSCOPY;  Service: Cardiovascular;  Laterality: N/A;    Current Medications: Outpatient Medications Prior to Visit  Medication Sig Dispense Refill  . aspirin EC 81 MG tablet Take 1 tablet (81 mg total) by mouth daily. 90 tablet 3  . atorvastatin (LIPITOR) 40 MG tablet Take 1 tablet (40 mg total) by mouth daily at 6 PM. 30 tablet 0  . chlorthalidone (HYGROTON) 25 MG tablet Take 0.5 tablets (12.5 mg total) by mouth daily. 45 tablet 0  . diltiazem (TIAZAC) 360 MG 24 hr capsule Take 1 capsule (360 mg total) by mouth daily. 90 capsule 0  . potassium chloride (K-DUR) 10 MEQ tablet Take 1 tablet (10 mEq total) by mouth daily. 90 tablet 0  . lisinopril (PRINIVIL,ZESTRIL) 20 MG tablet Take 20 mg by mouth daily.     No facility-administered medications prior to visit.      Allergies:   Patient has no known allergies.   Social History   Socioeconomic History  . Marital status: Married    Spouse name: Not on file  . Number of children: Not on file  . Years of education: Not on file  . Highest education level: Not on file  Occupational History  . Not on file  Social Needs  . Financial resource strain: Not on file  . Food insecurity:    Worry: Not on file    Inability: Not on file  . Transportation needs:    Medical: Not on file    Non-medical: Not on file  Tobacco Use  . Smoking status: Never Smoker  . Smokeless tobacco: Never Used  Substance and Sexual Activity  . Alcohol use: Yes    Alcohol/week: 0.0 oz    Comment: rare  . Drug use: No  . Sexual activity: Not on file  Lifestyle  . Physical activity:    Days per week: Not on file    Minutes per session: Not on file  . Stress: Not on file  Relationships  . Social connections:    Talks on phone: Not on file    Gets together: Not on file    Attends religious service: Not on file    Active member of club or organization:  Not on file    Attends meetings of clubs or organizations: Not on file    Relationship status: Not on file  Other Topics Concern  . Not on file  Social History Narrative   Lives in [place].  Works at [employer] as an Art gallery manager     Family History:  The patient's family history includes Heart attack (age of onset: 17) in his paternal grandfather; Hypertension in his father.   ROS:   Please see the history of present illness.    ROS All other systems reviewed and are negative.   PHYSICAL EXAM:  VS:  BP 130/80   Pulse 61   Ht 5\' 10"  (1.778 m)   Wt 205 lb 3.2 oz (93.1 kg)   BMI 29.44 kg/m    GEN: Well nourished, well developed, in no acute distress  HEENT: normal  Neck: no JVD, carotid bruits, or masses Cardiac: RRR; no murmurs, rubs, or gallops,no edema  Respiratory:  clear to auscultation bilaterally, normal work of breathing GI: soft, nontender, nondistended, + BS MS: no deformity or atrophy  Skin: warm and dry, no rash Neuro:  Alert and Oriented x 3, Strength and sensation are intact Psych: euthymic mood, full affect  Wt Readings from Last 3 Encounters:  04/16/18 205 lb 3.2 oz (93.1 kg)  10/09/17 214 lb (97.1 kg)  02/05/17 214 lb 12.8 oz (97.4 kg)      Studies/Labs Reviewed:   EKG:  EKG is not ordered today.  EKG was reportedly done recently at primary care providers office, will request record  Recent Labs: 10/10/2017: ALT 42; BUN 13; Creatinine, Ser 1.09; Hemoglobin 14.4; Platelets 209; Potassium 3.6; Sodium 141; TSH 1.500   Lipid Panel    Component Value Date/Time   CHOL 139 10/10/2017 0819   TRIG 96 10/10/2017 0819   HDL 45 10/10/2017 0819   CHOLHDL 3.1 10/10/2017 0819   CHOLHDL 4 09/16/2014 1003   VLDL 13.8 09/16/2014 1003   LDLCALC 75 10/10/2017 0819    Additional studies/ records that were reviewed today include:   ETT 10/28/2017 Study Highlights     Blood pressure demonstrated a normal response to exercise.  No T wave inversion was noted during  stress.  There was no ST segment deviation noted during stress.  Arrhythmias during stress: rare PVCs.  The test was stopped because the patient complained of fatigue  Overall, the patient's exercise capacity was excellent  [facility] Treadmill Score: low risk   Negative stress test without evidence of ischemia at given workload.       ASSESSMENT:    1. Essential hypertension   2. PAF (paroxysmal atrial fibrillation) (HCC)   3. Hyperlipidemia, unspecified hyperlipidemia type   4. Atypical chest pain      PLAN:  In order of problems listed above:  1. Hypertension: Blood pressure was reportedly low for several days after a sudden onset of nausea, vomiting and diarrhea.  It sounds like he had a GI virus.  This was accompanied along with hypotension with systolic blood pressure down to the 80s.  Chlorthalidone has been discontinued.  Lisinopril was cut back to 20 mg daily.  However in the past few days, blood pressure is returning to a higher side, patient took it upon himself to increase the lisinopril back up to 40 mg daily since yesterday.  His blood pressure is normal today at 130/80.  I am okay with continuing on the 40 mg daily of lisinopril however I would like to move the lisinopril dose to nighttime and keep the diltiazem dose at day time.  I recommended for him to keep a blood pressure diary and bring his blood pressure cuff to the next office visit.  2. Hyperlipidemia: On Lipitor 40 mg daily.  Lipid panel quite well controlled on the last lipid panel 10/10/2017, total cholesterol 139, triglyceride 96, HDL 45, LDL 75.  3. PAF: On aspirin and diltiazem. CHA2DS2-Vasc score 1 (HTN).  No obvious recurrence.  Heart rate regular on physical exam.  4. Atypical chest pain: This is the same chest discomfort he had last year, it has not increased  in frequency or duration.  It does not seem to occur with exertion.  ETT was negative in December 2018, continue observation.    Medication  Adjustments/Labs and Tests Ordered: Current medicines are reviewed at length with the patient today.  Concerns regarding medicines are outlined above.  Medication changes, Labs and Tests ordered today are listed in the Patient Instructions below. Patient Instructions  Medication Instructions:  INCREASE LISINOPRIL TO 40MG  AT BEDTIME  If you need a refill on your cardiac medications before your next appointment, please call your pharmacy.   Thank you for choosing [facility]!!          Signed, Azalee CourseHao Carlyann Placide, [place]  04/16/2018 5:32 PM    [facility] [address], [place], [place]  [address] Phone: [phone]; Fax: [phone]

## 2018-04-28 DIAGNOSIS — Z87828 Personal history of other (healed) physical injury and trauma: Secondary | ICD-10-CM | POA: Diagnosis not present

## 2018-04-28 DIAGNOSIS — R5383 Other fatigue: Secondary | ICD-10-CM | POA: Diagnosis not present

## 2018-04-28 DIAGNOSIS — E876 Hypokalemia: Secondary | ICD-10-CM | POA: Diagnosis not present

## 2018-04-28 DIAGNOSIS — E785 Hyperlipidemia, unspecified: Secondary | ICD-10-CM | POA: Diagnosis not present

## 2018-04-28 DIAGNOSIS — I10 Essential (primary) hypertension: Secondary | ICD-10-CM | POA: Diagnosis not present

## 2018-04-28 DIAGNOSIS — R739 Hyperglycemia, unspecified: Secondary | ICD-10-CM | POA: Diagnosis not present

## 2018-04-29 ENCOUNTER — Other Ambulatory Visit: Payer: Self-pay | Admitting: Cardiology

## 2018-04-29 DIAGNOSIS — I10 Essential (primary) hypertension: Secondary | ICD-10-CM

## 2018-04-29 DIAGNOSIS — I4891 Unspecified atrial fibrillation: Secondary | ICD-10-CM

## 2018-04-29 NOTE — Telephone Encounter (Signed)
Rx request sent to pharmacy.  

## 2018-05-04 NOTE — Progress Notes (Signed)
HPI: FU atrial fibrillation. Echocardiogram in September of 2013 showed normal LV function. Flecainide added for recurrent symptomatic atrial fibrillation but arrhythmia recurred. A followup nuclear study showed mild apical ischemia. Cardiac CT in November of 2013 showed no coronary disease and a calcium score of 0. Patient subsequently had atrial fibrillation ablation. ETT 12/18 normal. Since he was last seen  patient was in [place] and developed nausea, vomiting and diarrhea.  His blood pressure and potassium decreased.  His medications were adjusted.  His blood pressure has now increased despite resuming lisinopril at 40 mg daily.  He denies dyspnea.  Occasional discomfort in his left shoulder area but no exertional chest pain.  He describes new onset of palpitations.  They are described as a skip and not sustained.  Not similar to atrial fibrillation in the past.  Current Outpatient Medications  Medication Sig Dispense Refill  . aspirin EC 81 MG tablet Take 1 tablet (81 mg total) by mouth daily. 90 tablet 3  . atorvastatin (LIPITOR) 40 MG tablet TAKE 1 TABLET DAILY AT 6 P.M. 90 tablet 3  . diltiazem (TIAZAC) 360 MG 24 hr capsule TAKE 1 CAPSULE DAILY 90 capsule 0  . lisinopril (PRINIVIL,ZESTRIL) 40 MG tablet Take 1 tablet (40 mg total) by mouth at bedtime. 30 tablet 5   No current facility-administered medications for this visit.      Past Medical History:  Diagnosis Date  . History of gallstones    LAPAROSCOPIC CHOLECYSTECTOMY 2005  . Hypertension   . Paroxysmal atrial fibrillation (HCC)   . Pure hypercholesterolemia     Past Surgical History:  Procedure Laterality Date  . ATRIAL FIBRILLATION ABLATION N/A 10/20/2012   Procedure: ATRIAL FIBRILLATION ABLATION;  Surgeon: Hillis RangeJames Allred, MD;  [facility];  Service: Cardiovascular;  Laterality: N/A;  . LAMINECTOMY    . LAPAROSCOPIC CHOLECYSTECTOMY  07/04/2004 [facility]   DR. PAUL TOTH  . TEE WITHOUT CARDIOVERSION  10/19/2012     Procedure: TRANSESOPHAGEAL ECHOCARDIOGRAM (TEE);  Surgeon: Pricilla RifflePaula V Ross, MD;  [facility];  Service: Cardiovascular;  Laterality: N/A;    Social History   Socioeconomic History  . Marital status: Married    Spouse name: Not on file  . Number of children: Not on file  . Years of education: Not on file  . Highest education level: Not on file  Occupational History  . Not on file  Social Needs  . Financial resource strain: Not on file  . Food insecurity:    Worry: Not on file    Inability: Not on file  . Transportation needs:    Medical: Not on file    Non-medical: Not on file  Tobacco Use  . Smoking status: Never Smoker  . Smokeless tobacco: Never Used  Substance and Sexual Activity  . Alcohol use: Yes    Alcohol/week: 0.0 oz    Comment: rare  . Drug use: No  . Sexual activity: Not on file  Lifestyle  . Physical activity:    Days per week: Not on file    Minutes per session: Not on file  . Stress: Not on file  Relationships  . Social connections:    Talks on phone: Not on file    Gets together: Not on file    Attends religious service: Not on file    Active member of club or organization: Not on file    Attends meetings of clubs or organizations: Not on file    Relationship status:  Not on file  . Intimate partner violence:    Fear of current or ex partner: Not on file    Emotionally abused: Not on file    Physically abused: Not on file    Forced sexual activity: Not on file  Other Topics Concern  . Not on file  Social History Narrative   Lives in [place].  Works at [employer] as an Art gallery manager    Family History  Problem Relation Age of Onset  . Hypertension Father   . Heart attack Paternal Grandfather 64    ROS: Some fatigue and abdominal discomfort but no fevers or chills, productive cough, hemoptysis, dysphasia, odynophagia, melena, hematochezia, dysuria, hematuria, rash, seizure activity, orthopnea, PND, pedal edema, claudication. Remaining systems  are negative.  Physical Exam: Well-developed well-nourished in no acute distress.  Skin is warm and dry.  HEENT is normal.  Neck is supple.  Chest is clear to auscultation with normal expansion.  Cardiovascular exam is regular rate and rhythm.  Abdominal exam nontender or distended. No masses palpated. Extremities show no edema. neuro grossly intact  ECG-normal sinus rhythm at a rate of 68.  Occasional PAC and PVC.  Note patient was having palpitations at time of ECG.  Personally reviewed  A/P  1 paroxysmal atrial fibrillation-patient remains in sinus rhythm status post ablation.  We will continue Cardizem for rate control if atrial fibrillation recurs.  Anticoagulation has been discontinued.  2 hypertension-blood pressure is mildly elevated.  I am adding Toprol 25 mg daily for palpitations which should also help with blood pressure.  Follow at home and adjust as needed.  3 hyperlipidemia-continue statin.  4 chest pain-symptoms have been atypical in the past.  Previous treadmill negative.  5 palpitations-patient was having palpitations in the office and electrocardiogram showed PACs and PVCs.  I will add Toprol 25 mg daily both for blood pressure and palpitations.  Olga Millers, MD

## 2018-05-13 ENCOUNTER — Encounter: Payer: Self-pay | Admitting: Cardiology

## 2018-05-13 ENCOUNTER — Ambulatory Visit: Payer: BLUE CROSS/BLUE SHIELD | Admitting: Cardiology

## 2018-05-13 ENCOUNTER — Encounter

## 2018-05-13 VITALS — BP 144/88 | HR 66 | Ht 70.0 in | Wt 209.8 lb

## 2018-05-13 DIAGNOSIS — I480 Paroxysmal atrial fibrillation: Secondary | ICD-10-CM | POA: Diagnosis not present

## 2018-05-13 DIAGNOSIS — R002 Palpitations: Secondary | ICD-10-CM | POA: Diagnosis not present

## 2018-05-13 DIAGNOSIS — I10 Essential (primary) hypertension: Secondary | ICD-10-CM

## 2018-05-13 MED ORDER — METOPROLOL SUCCINATE ER 25 MG PO TB24: 25.0000 mg | ORAL_TABLET | Freq: Every day | ORAL | 3 refills | Status: DC

## 2018-05-13 NOTE — Patient Instructions (Signed)
Medication Instructions:   START METOPROLOL SUCC ER 25 MG ONCE DAILY AT BEDTIME  Follow-Up:  Your physician wants you to follow-up in: 6 MONTHS WITH DR CRENSHAW You will receive a reminder letter in the mail two months in advance. If you don't receive a letter, please call our office to schedule the follow-up appointment.   If you need a refill on your cardiac medications before your next appointment, please call your pharmacy.    

## 2018-06-02 ENCOUNTER — Other Ambulatory Visit: Payer: Self-pay | Admitting: Cardiology

## 2018-06-16 ENCOUNTER — Telehealth: Payer: Self-pay | Admitting: Cardiology

## 2018-06-16 DIAGNOSIS — I10 Essential (primary) hypertension: Secondary | ICD-10-CM | POA: Diagnosis not present

## 2018-06-16 DIAGNOSIS — R58 Hemorrhage, not elsewhere classified: Secondary | ICD-10-CM | POA: Diagnosis not present

## 2018-06-16 DIAGNOSIS — E785 Hyperlipidemia, unspecified: Secondary | ICD-10-CM | POA: Diagnosis not present

## 2018-06-16 DIAGNOSIS — R109 Unspecified abdominal pain: Secondary | ICD-10-CM | POA: Diagnosis not present

## 2018-06-16 DIAGNOSIS — R5383 Other fatigue: Secondary | ICD-10-CM | POA: Diagnosis not present

## 2018-06-16 NOTE — Telephone Encounter (Signed)
New Message    Pt c/o BP issue:  1. What are your last 5 BP readings? Can not recall.  2. Are you having any other symptoms (ex. Dizziness, headache, blurred vision, passed out)? Dizziness and cold sweats   3. What is your medication issue? Patients wife is calling on behalf. She states that his BP was high but she was at work and he was home so she did not have the readings. But he will have them.

## 2018-06-16 NOTE — Telephone Encounter (Signed)
Spoke with pt, he has an appointment this afternoon with his medical doctor and he is going to wait to start after talking to him. He will call me back tomorrow with an update.

## 2018-06-16 NOTE — Telephone Encounter (Signed)
Add hctz 12.5 mg daily; bmet one week; fu primary care for general malaise Olga MillersBrian Arna Luis

## 2018-06-16 NOTE — Telephone Encounter (Signed)
Pt called top report that he is not "feeling well". 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 He also had kidney stones in June. He says he just doesn't feel right. He only has two BP readings to report to me.Kevin Holmes. Yesterday after taking his meds it was 176/104 and after rechecking it in the evening it was 136/80.Marland Kitchen. today it is 146/89. He doesn't know his heart rate but he says his palpitations have been better. He is complaining of some dizziness and fatigue and generally not feeling well. I advised him to keep track of some more BP readings with times and to call his Primary Care Doctor today and see if he can get in with them this week. I told him that I will let Dr. Jens Somrenshaw know of his current situation and if he has any recommendations will let him know. Pt to call back in a few days with a few more BP readings.

## 2018-06-17 DIAGNOSIS — N200 Calculus of kidney: Secondary | ICD-10-CM | POA: Diagnosis not present

## 2018-06-17 DIAGNOSIS — R1084 Generalized abdominal pain: Secondary | ICD-10-CM | POA: Diagnosis not present

## 2018-06-17 DIAGNOSIS — N210 Calculus in bladder: Secondary | ICD-10-CM | POA: Diagnosis not present

## 2018-06-17 DIAGNOSIS — R109 Unspecified abdominal pain: Secondary | ICD-10-CM | POA: Diagnosis not present

## 2018-06-23 DIAGNOSIS — E876 Hypokalemia: Secondary | ICD-10-CM | POA: Diagnosis not present

## 2018-07-15 DIAGNOSIS — R5383 Other fatigue: Secondary | ICD-10-CM | POA: Diagnosis not present

## 2018-07-28 ENCOUNTER — Other Ambulatory Visit: Payer: Self-pay | Admitting: Cardiology

## 2018-07-28 DIAGNOSIS — I4891 Unspecified atrial fibrillation: Secondary | ICD-10-CM

## 2018-07-30 DIAGNOSIS — E785 Hyperlipidemia, unspecified: Secondary | ICD-10-CM | POA: Diagnosis not present

## 2018-07-30 DIAGNOSIS — Z125 Encounter for screening for malignant neoplasm of prostate: Secondary | ICD-10-CM | POA: Diagnosis not present

## 2018-07-30 DIAGNOSIS — Z87442 Personal history of urinary calculi: Secondary | ICD-10-CM | POA: Diagnosis not present

## 2018-07-30 DIAGNOSIS — E876 Hypokalemia: Secondary | ICD-10-CM | POA: Diagnosis not present

## 2018-07-30 DIAGNOSIS — I10 Essential (primary) hypertension: Secondary | ICD-10-CM | POA: Diagnosis not present

## 2018-08-10 DIAGNOSIS — N210 Calculus in bladder: Secondary | ICD-10-CM | POA: Diagnosis not present

## 2018-08-10 DIAGNOSIS — N200 Calculus of kidney: Secondary | ICD-10-CM | POA: Diagnosis not present

## 2018-08-10 DIAGNOSIS — E876 Hypokalemia: Secondary | ICD-10-CM | POA: Diagnosis not present

## 2018-09-03 DIAGNOSIS — E876 Hypokalemia: Secondary | ICD-10-CM | POA: Diagnosis not present

## 2018-09-03 DIAGNOSIS — I10 Essential (primary) hypertension: Secondary | ICD-10-CM | POA: Diagnosis not present

## 2018-09-03 DIAGNOSIS — E785 Hyperlipidemia, unspecified: Secondary | ICD-10-CM | POA: Diagnosis not present

## 2018-09-03 DIAGNOSIS — Z91018 Allergy to other foods: Secondary | ICD-10-CM | POA: Diagnosis not present

## 2018-10-05 DIAGNOSIS — I10 Essential (primary) hypertension: Secondary | ICD-10-CM | POA: Diagnosis not present

## 2018-10-05 DIAGNOSIS — E785 Hyperlipidemia, unspecified: Secondary | ICD-10-CM | POA: Diagnosis not present

## 2018-10-05 DIAGNOSIS — R1010 Upper abdominal pain, unspecified: Secondary | ICD-10-CM | POA: Diagnosis not present

## 2018-10-05 DIAGNOSIS — E876 Hypokalemia: Secondary | ICD-10-CM | POA: Diagnosis not present

## 2018-10-05 DIAGNOSIS — R0781 Pleurodynia: Secondary | ICD-10-CM | POA: Diagnosis not present

## 2018-10-06 ENCOUNTER — Other Ambulatory Visit: Payer: Self-pay | Admitting: Family Medicine

## 2018-10-06 DIAGNOSIS — R1010 Upper abdominal pain, unspecified: Secondary | ICD-10-CM

## 2018-10-06 DIAGNOSIS — R1012 Left upper quadrant pain: Secondary | ICD-10-CM

## 2018-10-07 ENCOUNTER — Ambulatory Visit
Admission: RE | Admit: 2018-10-07 | Discharge: 2018-10-07 | Disposition: A | Discharge status: Home / Self Care | Payer: BLUE CROSS/BLUE SHIELD | Source: Ambulatory Visit | Attending: Family Medicine | Admitting: Family Medicine

## 2018-10-07 DIAGNOSIS — R1012 Left upper quadrant pain: Secondary | ICD-10-CM

## 2018-10-07 DIAGNOSIS — R1010 Upper abdominal pain, unspecified: Secondary | ICD-10-CM

## 2018-10-07 DIAGNOSIS — K7689 Other specified diseases of liver: Secondary | ICD-10-CM | POA: Diagnosis not present

## 2018-10-07 DIAGNOSIS — R232 Flushing: Secondary | ICD-10-CM | POA: Diagnosis not present

## 2018-10-12 ENCOUNTER — Other Ambulatory Visit: Payer: BLUE CROSS/BLUE SHIELD

## 2018-10-29 DIAGNOSIS — E876 Hypokalemia: Secondary | ICD-10-CM | POA: Diagnosis not present

## 2018-10-29 DIAGNOSIS — N200 Calculus of kidney: Secondary | ICD-10-CM | POA: Diagnosis not present

## 2018-10-29 DIAGNOSIS — I10 Essential (primary) hypertension: Secondary | ICD-10-CM | POA: Diagnosis not present

## 2018-11-15 DIAGNOSIS — N200 Calculus of kidney: Secondary | ICD-10-CM | POA: Diagnosis not present

## 2018-11-16 DIAGNOSIS — N200 Calculus of kidney: Secondary | ICD-10-CM | POA: Diagnosis not present

## 2018-12-03 DIAGNOSIS — R82992 Hyperoxaluria: Secondary | ICD-10-CM | POA: Diagnosis not present

## 2018-12-03 DIAGNOSIS — N200 Calculus of kidney: Secondary | ICD-10-CM | POA: Diagnosis not present

## 2018-12-03 DIAGNOSIS — I10 Essential (primary) hypertension: Secondary | ICD-10-CM | POA: Diagnosis not present

## 2018-12-03 DIAGNOSIS — E876 Hypokalemia: Secondary | ICD-10-CM | POA: Diagnosis not present

## 2018-12-18 DIAGNOSIS — E876 Hypokalemia: Secondary | ICD-10-CM | POA: Diagnosis not present

## 2019-03-23 DIAGNOSIS — I10 Essential (primary) hypertension: Secondary | ICD-10-CM | POA: Diagnosis not present

## 2019-03-23 DIAGNOSIS — N200 Calculus of kidney: Secondary | ICD-10-CM | POA: Diagnosis not present

## 2019-03-29 DIAGNOSIS — S76211A Strain of adductor muscle, fascia and tendon of right thigh, initial encounter: Secondary | ICD-10-CM | POA: Diagnosis not present

## 2019-03-31 DIAGNOSIS — I10 Essential (primary) hypertension: Secondary | ICD-10-CM | POA: Diagnosis not present

## 2019-03-31 DIAGNOSIS — E876 Hypokalemia: Secondary | ICD-10-CM | POA: Diagnosis not present

## 2019-03-31 DIAGNOSIS — R82992 Hyperoxaluria: Secondary | ICD-10-CM | POA: Diagnosis not present

## 2019-03-31 DIAGNOSIS — N200 Calculus of kidney: Secondary | ICD-10-CM | POA: Diagnosis not present

## 2019-04-09 ENCOUNTER — Other Ambulatory Visit: Payer: Self-pay | Admitting: Cardiology

## 2019-04-09 DIAGNOSIS — I10 Essential (primary) hypertension: Secondary | ICD-10-CM

## 2019-04-15 ENCOUNTER — Other Ambulatory Visit: Payer: Self-pay

## 2019-04-15 ENCOUNTER — Emergency Department (HOSPITAL_COMMUNITY)
Admission: EM | Admit: 2019-04-15 | Discharge: 2019-04-15 | Disposition: A | Discharge status: Home / Self Care | Payer: BC Managed Care – PPO | Attending: Emergency Medicine | Admitting: Emergency Medicine

## 2019-04-15 ENCOUNTER — Encounter (HOSPITAL_COMMUNITY): Payer: Self-pay

## 2019-04-15 DIAGNOSIS — R5383 Other fatigue: Secondary | ICD-10-CM

## 2019-04-15 DIAGNOSIS — Z7982 Long term (current) use of aspirin: Secondary | ICD-10-CM | POA: Diagnosis not present

## 2019-04-15 DIAGNOSIS — Z79899 Other long term (current) drug therapy: Secondary | ICD-10-CM | POA: Insufficient documentation

## 2019-04-15 DIAGNOSIS — I4891 Unspecified atrial fibrillation: Secondary | ICD-10-CM | POA: Insufficient documentation

## 2019-04-15 DIAGNOSIS — R531 Weakness: Secondary | ICD-10-CM | POA: Diagnosis not present

## 2019-04-15 DIAGNOSIS — R110 Nausea: Secondary | ICD-10-CM | POA: Insufficient documentation

## 2019-04-15 DIAGNOSIS — I10 Essential (primary) hypertension: Secondary | ICD-10-CM | POA: Insufficient documentation

## 2019-04-15 DIAGNOSIS — R001 Bradycardia, unspecified: Secondary | ICD-10-CM | POA: Diagnosis not present

## 2019-04-15 LAB — CBC WITH DIFFERENTIAL/PLATELET
Abs Immature Granulocytes: 0.02 10*3/uL (ref 0.00–0.07)
Basophils Absolute: 0.1 10*3/uL (ref 0.0–0.1)
Basophils Relative: 2 %
Eosinophils Absolute: 0.1 10*3/uL (ref 0.0–0.5)
Eosinophils Relative: 2 %
HCT: 42.5 % (ref 39.0–52.0)
Hemoglobin: 14.4 g/dL (ref 13.0–17.0)
Immature Granulocytes: 0 %
Lymphocytes Relative: 31 %
Lymphs Abs: 1.4 10*3/uL (ref 0.7–4.0)
MCH: 30.3 pg (ref 26.0–34.0)
MCHC: 33.9 g/dL (ref 30.0–36.0)
MCV: 89.5 fL (ref 80.0–100.0)
Monocytes Absolute: 0.5 10*3/uL (ref 0.1–1.0)
Monocytes Relative: 11 %
Neutro Abs: 2.5 10*3/uL (ref 1.7–7.7)
Neutrophils Relative %: 54 %
Platelets: 172 10*3/uL (ref 150–400)
RBC: 4.75 MIL/uL (ref 4.22–5.81)
RDW: 11.9 % (ref 11.5–15.5)
WBC: 4.5 10*3/uL (ref 4.0–10.5)
nRBC: 0 % (ref 0.0–0.2)

## 2019-04-15 LAB — BASIC METABOLIC PANEL
Anion gap: 9 (ref 5–15)
BUN: 8 mg/dL (ref 6–20)
CO2: 24 mmol/L (ref 22–32)
Calcium: 9.1 mg/dL (ref 8.9–10.3)
Chloride: 108 mmol/L (ref 98–111)
Creatinine, Ser: 0.86 mg/dL (ref 0.61–1.24)
GFR calc Af Amer: >60 mL/min (ref >60)
GFR calc non Af Amer: >60 mL/min (ref >60)
Glucose, Bld: 96 mg/dL (ref 70–99)
Potassium: 3.6 mmol/L (ref 3.5–5.1)
Sodium: 141 mmol/L (ref 135–145)

## 2019-04-15 LAB — CBG MONITORING, ED: Glucose-Capillary: 102 mg/dL — ABNORMAL HIGH (ref 70–99)

## 2019-04-15 MED ORDER — PANTOPRAZOLE SODIUM 40 MG PO TBEC: 40.0000 mg | DELAYED_RELEASE_TABLET | Freq: Every day | ORAL | 0 refills | Status: DC

## 2019-04-15 MED ORDER — SODIUM CHLORIDE 0.9 % IV BOLUS
1000.0000 mL | Freq: Once | INTRAVENOUS | Status: AC
  Administered 2019-04-15: 1000 mL via INTRAVENOUS

## 2019-04-15 NOTE — ED Provider Notes (Signed)
[facility] EMERGENCY DEPARTMENT Provider Note   CSN: [id number] Arrival date & time: 6/4/2[facility]  Chief Complaint Chief Complaint  Patient presents with  . Nausea  . Fatigue    HPI Holmes Holmes is a 53 y.o. male who presents with generalized weakness, fatigue and nausea.  Past medical history significant for chronic fatigue, chronic hypokalemia, history of A. fib status post ablation, hypertension, hyperlipidemia, alpha gal allergy from tick bite.  The patient states that for the past 2 years he has had issues with severe fatigue.  Over the past day or 2 his symptoms have become much worse.  He is on him on multiple blood pressure medicines and on daily potassium supplementation.  He is seeing a nephrologist and was recently worked up for aldosteronism and work-up is been reassuring.  He is somewhat worried since he does not have a diagnosis so he does not know what to think.  He thinks his potassium might be slightly low.  He went outside today and became extremely fatigued and had to lie down and rest.  Due to this he decided to come to the emergency department.  He states he has not felt well since being on blood pressure medicines. Metoprolol was just started a couple months ago for palpitations. He denies any significant pain.  No fever, headache, chest pain, shortness of breath, severe abdominal pain.  He has some issues with nausea and queasiness.  He was previously on Protonix and the symptoms were better when he was taking that.    HPI  Past Medical History:  Diagnosis Date  . History of gallstones    LAPAROSCOPIC CHOLECYSTECTOMY 2005  . Hypertension   . Paroxysmal atrial fibrillation (HCC)   . Pure hypercholesterolemia     Patient Active Problem List   Diagnosis Date Noted  . Lymphadenopathy of right cervical region 04/29/2013  . Neck pain 04/29/2013  . Clavicle enlargement 04/29/2013  . Nonspecific abnormal unspecified cardiovascular  function study 09/07/2012  . Atrial fibrillation (HCC) 07/29/2012  . Hypertension 12/12/2011  . Hyperlipidemia 12/12/2011  . Chest discomfort 10/22/2011  . Palpitations 10/22/2011    Past Surgical History:  Procedure Laterality Date  . ATRIAL FIBRILLATION ABLATION N/A 10/20/2012   Procedure: ATRIAL FIBRILLATION ABLATION;  Surgeon: Hillis Range, MD;  Location: [facility] CATH LAB;  Service: Cardiovascular;  Laterality: N/A;  . LAMINECTOMY    . LAPAROSCOPIC CHOLECYSTECTOMY  07/04/2004 [place]   DR. PAUL TOTH  . TEE WITHOUT CARDIOVERSION  10/19/2012   Procedure: TRANSESOPHAGEAL ECHOCARDIOGRAM (TEE);  Surgeon: Pricilla Riffle, MD;  Location: [facility] ENDOSCOPY;  Service: Cardiovascular;  Laterality: N/A;        Home Medications    Prior to Admission medications   Medication Sig Start Date End Date Taking? Authorizing Provider  aspirin EC 81 MG tablet Take 1 tablet (81 mg total) by mouth daily. 04/22/13   Allred, Fayrene Fearing, MD  atorvastatin (LIPITOR) 40 MG tablet TAKE 1 TABLET DAILY AT 6 P.M. 04/29/18   Lewayne Bunting, MD  diltiazem [facility]) 360 MG 24 hr capsule TAKE 1 CAPSULE DAILY 07/28/18   Lewayne Bunting, MD  lisinopril (PRINIVIL,ZESTRIL) 40 MG tablet TAKE 1 TABLET DAILY 06/02/18   Azalee Course, PA  metoprolol succinate (TOPROL-XL) 25 MG 24 hr tablet Take 1 tablet (25 mg total) by mouth daily. SCHEDULE OV FOR FURTHER REFILLS.Marland Kitchen 2ND ATTEMPT. 04/09/19   Lewayne Bunting, MD    Family History Family History  Problem  Relation Age of Onset  . Hypertension Father   . Heart attack Paternal Grandfather 67    Social History Social History   Tobacco Use  . Smoking status: Never Smoker  . Smokeless tobacco: Never Used  Substance Use Topics  . Alcohol use: Yes    Alcohol/week: 0.0 standard drinks    Comment: rare  . Drug use: No     Allergies   Patient has no known allergies.   Review of Systems Review of Systems  Constitutional: Positive for fatigue. Negative for fever.  Respiratory: Negative  for cough and shortness of breath.   Cardiovascular: Negative for chest pain.  Gastrointestinal: Positive for nausea. Negative for abdominal pain, diarrhea and vomiting.  Genitourinary: Negative for difficulty urinating and dysuria.  Musculoskeletal: Negative for myalgias.  Neurological: Positive for weakness.  All other systems reviewed and are negative.    Physical Exam Updated Vital Signs BP 134/90   Pulse (!) 55   Temp 98.3 F (36.8 C) (Oral)   Resp 18   SpO2 96%   Physical Exam Vitals signs and nursing note reviewed.  Constitutional:      General: He is not in acute distress.    Appearance: Normal appearance. He is well-developed. He is not ill-appearing.  HENT:     Head: Normocephalic and atraumatic.  Eyes:     General: No scleral icterus.       Right eye: No discharge.        Left eye: No discharge.     Conjunctiva/sclera: Conjunctivae normal.     Pupils: Pupils are equal, round, and reactive to light.  Neck:     Musculoskeletal: Normal range of motion.  Cardiovascular:     Rate and Rhythm: Bradycardia present.  Pulmonary:     Effort: Pulmonary effort is normal. No respiratory distress.     Breath sounds: Normal breath sounds.  Abdominal:     General: There is no distension.     Palpations: Abdomen is soft.     Tenderness: There is no abdominal tenderness.  Musculoskeletal:     Right lower leg: No edema.     Left lower leg: No edema.  Skin:    General: Skin is warm and dry.  Neurological:     Mental Status: He is alert and oriented to person, place, and time.  Psychiatric:        Mood and Affect: Mood normal.        Behavior: Behavior normal.      ED Treatments / Results  Labs (all labs ordered are listed, but only abnormal results are displayed) Labs Reviewed  CBG MONITORING, ED - Abnormal; Notable for the following components:      Result Value   Glucose-Capillary 102 (*)    All other components within normal limits  BASIC METABOLIC PANEL   CBC WITH DIFFERENTIAL/PLATELET    EKG None  Radiology No results found.  Procedures Procedures (including critical care time)  Medications Ordered in ED Medications - No data to display   Initial Impression / Assessment and Plan / ED Course  I have reviewed the triage vital signs and the nursing notes.  Pertinent labs & imaging results that were available during my care of the patient were reviewed by me and considered in my medical decision making (see chart for details).  53 year old male presents with nausea and fatigue.  Symptoms have been intermittent for years.  He felt particularly bad today which prompted his ED visit.  He is bradycardic  but otherwise vital signs are normal.  This is likely from his beta-blocker.  He is primarily concerned that his electrolytes may be off.  Will obtain labs and reassess.  CBC and BMP are normal.  Discussed with patient.  He is obviously frustrated because he does not have a diagnosis for why he feels so bad sometimes.  I offered him IV fluids in case there was some mild dehydration going on.  Afterwards he did not feel much better.  We did refill his Protonix which she was taking for the nausea and stomach pain which he has been having.  He was encouraged to follow-up with his primary Care provider and nephrologist.  Final Clinical Impressions(s) / ED Diagnoses   Final diagnoses:  Other fatigue    ED Discharge Orders    None       Bethel BornGekas, Joanell Cressler Marie, PA-C 04/16/19 [id number]    Derwood KaplanNanavati, Ankit, MD 04/16/19 1721

## 2019-04-15 NOTE — ED Triage Notes (Signed)
Pt endorses fatigue with nausea and malaise x 2 years, worse recently. Seeing nephrologist. VSS. Axox4.

## 2019-04-15 NOTE — Discharge Instructions (Addendum)
Restart Protonix Please follow up with Dr. Modesto Charon Return if worsening

## 2019-04-16 DIAGNOSIS — E785 Hyperlipidemia, unspecified: Secondary | ICD-10-CM | POA: Diagnosis not present

## 2019-04-16 DIAGNOSIS — I10 Essential (primary) hypertension: Secondary | ICD-10-CM | POA: Diagnosis not present

## 2019-04-16 DIAGNOSIS — Z91018 Allergy to other foods: Secondary | ICD-10-CM | POA: Diagnosis not present

## 2019-04-16 DIAGNOSIS — E876 Hypokalemia: Secondary | ICD-10-CM | POA: Diagnosis not present

## 2019-04-19 ENCOUNTER — Telehealth: Payer: Self-pay | Admitting: Cardiology

## 2019-04-19 DIAGNOSIS — E7800 Pure hypercholesterolemia, unspecified: Secondary | ICD-10-CM

## 2019-04-19 NOTE — Telephone Encounter (Signed)
Spoke with pt who report that he's noticed lately when he wake up in the morning his BP has been running higher. He report it range around 145/90-150/90. He state this morning it was 150/100. Pt state after he take his morning medication and throughout the day its WNL. Pt denies symptoms of headache or blurred vision. Pt also concerned as he state his HR has been running lower than normal. He report his HR is normally in the 60's but past few days its been 53-57. He state he does sometimes feel dizzy. Will route to MD for recommendations.

## 2019-04-19 NOTE — Telephone Encounter (Signed)
.  Pt c/o medication issue:  1. Name of Medication: Metoprolol  2. How are you currently taking this medication (dosage and times per day)? 1 time at night  3. Are you having a reaction (difficulty breathing--STAT)? no  4. What is your medication issue? Blood Pressure been up and pulse rate been low- Pt wants to be seen asap

## 2019-04-20 NOTE — Telephone Encounter (Signed)
Spoke with pt, his medical doctor did change his lisinopril to 20 mg twice daily, he has done that for the last 3 days and he has not really seen a change in his bp. He has had fatigue for some time but over the last 2-3 weeks it has gotten worse. He also reports his pulse being as low as 50 at times. He takes his metoprolol in the evening but did not take it last night and does feel some better this am and his pulse is 66. One year ago he was diagnosed with alphagal syndrome and felt like a lot of the fatigue was coming from that but not sure anymore. He has a cousin that works at [employer] and he was concerned with the interaction of the diltiazem and lipitor. Explained to the patient he can take a statin holiday and stop the lipitor to see if that helps his symptoms. Follow up video visit scheduled 04-28-19 with dr Stanford Breed. Will forward for dr Stanford Breed review for any other recommendations.

## 2019-04-20 NOTE — Telephone Encounter (Signed)
Patient called this morning wanting to speak to nurse.

## 2019-04-20 NOTE — Telephone Encounter (Signed)
DC toprol and follow BP and HR Check lipids/liver/CK FU as scheduled Kirk Ruths

## 2019-04-20 NOTE — Telephone Encounter (Signed)
Spoke with pt, Aware of dr crenshaw's recommendations.  Lab orders mailed to the pt  

## 2019-04-23 DIAGNOSIS — E7800 Pure hypercholesterolemia, unspecified: Secondary | ICD-10-CM | POA: Diagnosis not present

## 2019-04-23 LAB — HEPATIC FUNCTION PANEL
ALT: 22 IU/L (ref 0–44)
AST: 20 IU/L (ref 0–40)
Albumin: 4.7 g/dL (ref 3.8–4.9)
Alkaline Phosphatase: 120 IU/L — ABNORMAL HIGH (ref 39–117)
Bilirubin Total: 0.8 mg/dL (ref 0.0–1.2)
Bilirubin, Direct: 0.17 mg/dL (ref 0.00–0.40)
Total Protein: 7.3 g/dL (ref 6.0–8.5)

## 2019-04-23 LAB — LIPID PANEL
Chol/HDL Ratio: 3.7 ratio (ref 0.0–5.0)
Cholesterol, Total: 162 mg/dL (ref 100–199)
HDL: 44 mg/dL (ref >39)
LDL Calculated: 93 mg/dL (ref 0–99)
Triglycerides: 126 mg/dL (ref 0–149)
VLDL Cholesterol Cal: 25 mg/dL (ref 5–40)

## 2019-04-23 LAB — CK: Total CK: 45 U/L (ref 41–331)

## 2019-04-24 ENCOUNTER — Other Ambulatory Visit: Payer: Self-pay | Admitting: Cardiology

## 2019-04-27 ENCOUNTER — Encounter: Payer: Self-pay | Admitting: Cardiology

## 2019-04-27 ENCOUNTER — Encounter: Payer: Self-pay | Admitting: *Deleted

## 2019-04-27 ENCOUNTER — Telehealth: Payer: Self-pay

## 2019-04-27 NOTE — Telephone Encounter (Signed)
Called patient to get consent for virtual visit tomorrow with dr Stanford Breed.

## 2019-04-27 NOTE — Telephone Encounter (Signed)
New Message ° ° ° °Pt is returning call  ° ° ° °Please call back  °

## 2019-04-27 NOTE — Telephone Encounter (Signed)
Tele-health visit consent sent to patient via my chart

## 2019-04-27 NOTE — Progress Notes (Signed)
Virtual Visit via Video Note   This visit type was conducted due to national recommendations for restrictions regarding the COVID-19 Pandemic (e.g. social distancing) in an effort to limit this patient's exposure and mitigate transmission in our community.  Due to his co-morbid illnesses, this patient is at least at moderate risk for complications without adequate follow up.  This format is felt to be most appropriate for this patient at this time.  All issues noted in this document were discussed and addressed.  A limited physical exam was performed with this format.  Please refer to the patient's chart for his consent to telehealth for [facility].   Date:  04/28/2019   ID:  Kevin Holmes, DOB 01/02/1966, MRN [id number]  Patient Location: Home Provider Location: Home  PCP:  Vernie Shanks, MD  Cardiologist:  Kirk Ruths, MD  Evaluation Performed:  Follow-Up Visit  Chief Complaint:  Atrial fibrillation  History of Present Illness:    FU atrial fibrillation. Echocardiogram in September of 2013 showed normal LV function. Flecainide added for recurrent symptomatic atrial fibrillation but arrhythmia recurred. A followup nuclear study showed mild apical ischemia. Cardiac CT in November of 2013 showed no coronary disease and a calcium score of 0. Patient subsequently had atrial fibrillation ablation. ETT 12/18 normal.  Recently contacted the office with complaints of fatigue and mild bradycardia.  Toprol discontinued.  Laboratories June 2020 showed normal CK, mildly elevated alkaline phosphatase at 120, normal hemoglobin and normal renal function.  Since he was last seenpatient denies dyspnea or syncope.  Rare brief palpitations.  Occasional brief chest pain when lying down but no exertional chest pain.  His fatigue has somewhat improved since discontinuing Toprol and Lipitor.  The patient does not have symptoms concerning for COVID-19 infection (fever, chills, cough, or new shortness of  breath).    Past Medical History:  Diagnosis Date  . History of gallstones    LAPAROSCOPIC CHOLECYSTECTOMY 2005  . Hypertension   . Paroxysmal atrial fibrillation (HCC)   . Pure hypercholesterolemia    Past Surgical History:  Procedure Laterality Date  . ATRIAL FIBRILLATION ABLATION N/A 10/20/2012   Procedure: ATRIAL FIBRILLATION ABLATION;  Surgeon: Thompson Grayer, MD;  Location: [facility] CATH LAB;  Service: Cardiovascular;  Laterality: N/A;  . LAMINECTOMY    . LAPAROSCOPIC CHOLECYSTECTOMY  07/04/2004 [place]   DR. PAUL TOTH  . TEE WITHOUT CARDIOVERSION  10/19/2012   Procedure: TRANSESOPHAGEAL ECHOCARDIOGRAM (TEE);  Surgeon: Fay Records, MD;  Location: [facility] ENDOSCOPY;  Service: Cardiovascular;  Laterality: N/A;     Current Meds  Medication Sig  . aspirin EC 81 MG tablet Take 1 tablet (81 mg total) by mouth daily.  Marland Kitchen diltiazem (TIAZAC) 360 MG 24 hr capsule TAKE 1 CAPSULE DAILY (Patient taking differently: Take 360 mg by mouth daily. )  . lisinopril (PRINIVIL,ZESTRIL) 40 MG tablet TAKE 1 TABLET DAILY (Patient taking differently: Take 20 mg by mouth 2 (two) times a day. )  . Potassium Citrate 15 MEQ (1620 MG) TBCR Take 4 tablets by mouth daily.     Allergies:   Other   Social History   Tobacco Use  . Smoking status: Never Smoker  . Smokeless tobacco: Never Used  Substance Use Topics  . Alcohol use: Yes    Alcohol/week: 0.0 standard drinks    Comment: rare  . Drug use: No     Family Hx: The patient's family history includes Heart attack (age of onset: 71) in his paternal grandfather; Hypertension in his  father.  ROS:   Please see the history of present illness.    No fevers, chills, productive cough. All other systems reviewed and are negative.  Recent Labs: 04/15/2019: BUN 8; Creatinine, Ser 0.86; Hemoglobin 14.4; Platelets 172; Potassium 3.6; Sodium 141 04/23/2019: ALT 22   Recent Lipid Panel Lab Results  Component Value Date/Time   CHOL 162 04/23/2019 10:05 AM   TRIG 126  04/23/2019 10:05 AM   HDL 44 04/23/2019 10:05 AM   CHOLHDL 3.7 04/23/2019 10:05 AM   CHOLHDL 4 09/16/2014 10:03 AM   LDLCALC 93 04/23/2019 10:05 AM    Wt Readings from Last 3 Encounters:  04/28/19 204 lb (92.5 kg)  05/13/18 209 lb 12.8 oz (95.2 kg)  04/16/18 205 lb 3.2 oz (93.1 kg)     Objective:    Vital Signs:  BP (!) 132/95   Pulse 71   Ht 5\' 10"  (1.778 m)   Wt 204 lb (92.5 kg)   BMI 29.27 kg/m    VITAL SIGNS:  reviewed  No acute distress Answers questions appropriately Normal affect Remainder physical examination not performed (telehealth visit; coronavirus pandemic)  ASSESSMENT & PLAN:    1. Paroxysmal atrial fibrillation-based on history patient remains in sinus rhythm.  Toprol was discontinued because of fatigue and bradycardia.  Continue Cardizem at present dose.  Anticoagulation previously discontinued. 2. Hypertension-BP elevated; add spironolactone 25 mg daily; check BMET one week; follow BP and advance regimen as needed. 3. Hyperlipidemia-lipitor DCed recently; we will follow patient symptoms.  If they improve we will consider a different statin in the future. 4. Palpitations-not particular bothersome at present.  As outlined above Toprol discontinued because of fatigue and bradycardia. 5. History of chest pain-no recent symptoms.  Previous treadmill negative.  COVID-19 Education: The importance of social distancing was discussed today.  Time:   Today, I have spent 20 minutes with the patient with telehealth technology discussing the above problems.     Medication Adjustments/Labs and Tests Ordered: Current medicines are reviewed at length with the patient today.  Concerns regarding medicines are outlined above.   Tests Ordered: No orders of the defined types were placed in this encounter.   Medication Changes: No orders of the defined types were placed in this encounter.    Follow Up:  Virtual Visit or In Person in 3 month(s)  Signed, Olga MillersBrian  Crenshaw, MD  04/28/2019 7:44 AM    [facility]

## 2019-04-27 NOTE — Telephone Encounter (Signed)
This encounter was created in error - please disregard.

## 2019-04-28 ENCOUNTER — Other Ambulatory Visit: Payer: Self-pay

## 2019-04-28 ENCOUNTER — Encounter: Payer: Self-pay | Admitting: Cardiology

## 2019-04-28 ENCOUNTER — Telehealth (INDEPENDENT_AMBULATORY_CARE_PROVIDER_SITE_OTHER): Payer: BC Managed Care – PPO | Admitting: Cardiology

## 2019-04-28 VITALS — BP 132/95 | HR 71 | Ht 70.0 in | Wt 204.0 lb

## 2019-04-28 DIAGNOSIS — I480 Paroxysmal atrial fibrillation: Secondary | ICD-10-CM | POA: Diagnosis not present

## 2019-04-28 DIAGNOSIS — E7800 Pure hypercholesterolemia, unspecified: Secondary | ICD-10-CM

## 2019-04-28 DIAGNOSIS — I10 Essential (primary) hypertension: Secondary | ICD-10-CM

## 2019-04-28 MED ORDER — SPIRONOLACTONE 25 MG PO TABS: 25.0000 mg | ORAL_TABLET | Freq: Every day | ORAL | 3 refills | Status: DC

## 2019-04-28 NOTE — Patient Instructions (Signed)
Medication Instructions:  START SPIRONOLACTONE 25 MG ONCE DAILY If you need a refill on your cardiac medications before your next appointment, please call your pharmacy.   Lab work: Your physician recommends that you return for lab work in: [place] If you have labs (blood work) drawn today and your tests are completely normal, you will receive your results only by: Marland Kitchen MyChart Message (if you have MyChart) OR . A paper copy in the mail If you have any lab test that is abnormal or we need to change your treatment, we will call you to review the results.  Follow-Up: At [facility], you and your health needs are our priority.  As part of our continuing mission to provide you with exceptional heart care, we have created designated Provider Care Teams.  These Care Teams include your primary Cardiologist (physician) and Advanced Practice Providers (APPs -  Physician Assistants and Nurse Practitioners) who all work together to provide you with the care you need, when you need it. Your physician recommends that you schedule a follow-up appointment in: [place]

## 2019-05-04 ENCOUNTER — Telehealth: Payer: BC Managed Care – PPO | Admitting: Adult Health

## 2019-05-07 DIAGNOSIS — I480 Paroxysmal atrial fibrillation: Secondary | ICD-10-CM | POA: Diagnosis not present

## 2019-05-08 LAB — BASIC METABOLIC PANEL
BUN/Creatinine Ratio: 10 (ref 9–20)
BUN: 11 mg/dL (ref 6–24)
CO2: 25 mmol/L (ref 20–29)
Calcium: 9.4 mg/dL (ref 8.7–10.2)
Chloride: 100 mmol/L (ref 96–106)
Creatinine, Ser: 1.08 mg/dL (ref 0.76–1.27)
GFR calc Af Amer: 89 mL/min/{1.73_m2} (ref >59)
GFR calc non Af Amer: 77 mL/min/{1.73_m2} (ref >59)
Glucose: 91 mg/dL (ref 65–99)
Potassium: 4.4 mmol/L (ref 3.5–5.2)
Sodium: 138 mmol/L (ref 134–144)

## 2019-05-10 ENCOUNTER — Telehealth: Payer: Self-pay | Admitting: Cardiology

## 2019-05-10 NOTE — Telephone Encounter (Signed)
Patient is calling for lab results.

## 2019-05-10 NOTE — Telephone Encounter (Signed)
Notes recorded by Lelon Perla, MD on 04/23/2019 at 5:53 PM EDT  No change in med  [place] informed of providers result & recommendations. Pt verbalized understanding. No further questions .

## 2019-05-12 ENCOUNTER — Encounter: Payer: Self-pay | Admitting: Physician Assistant

## 2019-05-12 ENCOUNTER — Other Ambulatory Visit: Payer: Self-pay

## 2019-05-12 ENCOUNTER — Ambulatory Visit (INDEPENDENT_AMBULATORY_CARE_PROVIDER_SITE_OTHER): Payer: BC Managed Care – PPO | Admitting: Physician Assistant

## 2019-05-12 ENCOUNTER — Telehealth: Payer: Self-pay | Admitting: Cardiology

## 2019-05-12 VITALS — BP 122/80 | HR 77 | Ht 70.0 in | Wt 203.4 lb

## 2019-05-12 DIAGNOSIS — I10 Essential (primary) hypertension: Secondary | ICD-10-CM | POA: Diagnosis not present

## 2019-05-12 DIAGNOSIS — M79604 Pain in right leg: Secondary | ICD-10-CM | POA: Diagnosis not present

## 2019-05-12 DIAGNOSIS — M7989 Other specified soft tissue disorders: Secondary | ICD-10-CM

## 2019-05-12 DIAGNOSIS — R5382 Chronic fatigue, unspecified: Secondary | ICD-10-CM

## 2019-05-12 DIAGNOSIS — I480 Paroxysmal atrial fibrillation: Secondary | ICD-10-CM | POA: Diagnosis not present

## 2019-05-12 NOTE — Telephone Encounter (Signed)
Spoke to  Patient  He states he has been having  Cramps in right leg  And tingling for several weeks.  " feels like swelling  Back of the calf at times ,  No pain in right foot ,but patient states he went to dentist - and when he came home  His right foot was bruised , no injury noted, not tender. appt scheduled w/ extender today  At 1:30 pm         COVID-19 Pre-Screening Questions:  . In the past 7 to 10 days have you had a cough,  shortness of breath, headache, congestion, fever (100 or greater) body aches, chills, sore throat, or sudden loss of taste or sense of smell?no . Have you been around anyone with known Covid 19.- no . Have you been around anyone who is awaiting Covid 19 test results in the past 7 to 10 days?-no . Have you been around anyone who has been exposed to Covid 19, or has mentioned symptoms of Covid 19 within the past 7 to 10 days?-no  If you have any concerns/questions about symptoms patients report during screening (either on the phone or at [facility]). Contact the provider seeing the patient or DOD for further guidance.  If neither are available contact a member of the leadership team. Aware to wear face covering , no visitor with patient

## 2019-05-12 NOTE — Progress Notes (Signed)
Cardiology Office Note    Date:  05/14/2019   ID:  Kevin Holmes, DOB 03/30/1966, MRN [id number]  PCP:  Ileana LaddWong, Francis P, MD  Cardiologist: Dr. Jens Somrenshaw  Chief Complaint  Patient presents with   Follow-up    seen for Dr. Jens Somrenshaw    History of Present Illness:  Kevin Holmes is a 53 y.o. male with past medical history of atrial fibrillation, hypertension, hyperlipidemia and history of laparoscopic cholecystectomy.  Echocardiogram in September 2013 showed normal LV function.  Due to recurrence of atrial fibrillation, flecainide was added however arrhythmia recurred.  Coronary CT showed no coronary artery disease and a calcium score of 0 in 2013.  He subsequently underwent atrial fibrillation ablation.  ETT in December 2018 was normal.  Due to fatigue and mild bradycardia, his Toprol was discontinued in 2020.  He was last seen via telehealth visit by Dr. Jens Somrenshaw in 04/28/2019, at which time he denied any dyspnea however does have brief palpitations.  He also has occasional brief chest pain when laying down but not with exertion.  Spironolactone 25 mg was added for elevated blood pressure.  Lab work obtained on 05/07/2019 showed stable electrolytes.  Patient presented to cardiology office today for evaluation of right lower extremity pain.  Interestingly, the location of the pain keeps changing and so is the characteristic of pain.  Sometimes it is a numbness, sometimes it is like a spasm, whereas other times it could be sharp shooting pain.  The right lower extremity pain is not triggered by exertion.  Patient has been able to mow his yard a week and a half ago with a push lawnmower without any issue.  He had a dentist appointment yesterday, after coming out of the appointment, he realized he had some bruising on the dorsal surface of the right foot.  On physical exam, the discoloration on the dorsal surface is quite mild and appears to be yellow.  The discoloration is circular in size and has a  diameter of roughly 3 cm.  It is not tender on exam nor is it itchy.  Kevin Holmes also noticed a bulge in the right lower extremity, however on physical exam, I do feel the muscle but there is no sign of hematoma or any swelling in the area.  He says he has not been very active due to the pandemic.  However, my suspicion for DVT is fairly low in this case as patient still able to do everyday activity at home.  I recommended a d-dimer as initial lab test which has very good negative predictability.  If d-dimer is negative, then he does not need a venous Doppler.  He says his right lower extremity pain occurs more so when he is laying in bed for a long time or when he is sitting in the chair for a long time, I think this still could be sciatica in nature when his nerve is being compressed.  Otherwise on physical exam, he has 2+ posterior tibial pulse and his symptom is inconsistent with PAD.  Patient did mention he was diagnosed with alpha gal after tick bite 2 years ago.  He did recently had unknown bite with formation of a rash near the right hip, however he did not see what bit him.  His nephrologist Dr. Marisue HumbleSanford is referring [facility] of hypokalemia and fatigue.  Recent lab work obtained in June showed a stable renal function, electrolytes and liver enzymes.   Past Medical History:  Diagnosis Date   History of gallstones    LAPAROSCOPIC CHOLECYSTECTOMY 2005   Hypertension    Paroxysmal atrial fibrillation (HCC)    Pure hypercholesterolemia     Past Surgical History:  Procedure Laterality Date   ATRIAL FIBRILLATION ABLATION N/A 10/20/2012   Procedure: ATRIAL FIBRILLATION ABLATION;  Surgeon: Hillis RangeJames Allred, MD;  [facility];  Service: Cardiovascular;  Laterality: N/A;   LAMINECTOMY     LAPAROSCOPIC CHOLECYSTECTOMY  07/04/2004 [place]   DR. PAUL TOTH   TEE WITHOUT CARDIOVERSION  10/19/2012   Procedure: TRANSESOPHAGEAL ECHOCARDIOGRAM (TEE);  Surgeon: Pricilla RifflePaula V  Ross, MD;  [facility];  Service: Cardiovascular;  Laterality: N/A;    Current Medications: Outpatient Medications Prior to Visit  Medication Sig Dispense Refill   aspirin EC 81 MG tablet Take 1 tablet (81 mg total) by mouth daily. 90 tablet 3   diltiazem (TIAZAC) 360 MG 24 hr capsule TAKE 1 CAPSULE DAILY (Patient taking differently: Take 360 mg by mouth daily. ) 90 capsule 4   lisinopril (PRINIVIL,ZESTRIL) 40 MG tablet TAKE 1 TABLET DAILY (Patient taking differently: Take 20 mg by mouth 2 (two) times a day. ) 90 tablet 3   Potassium Citrate 15 MEQ (1620 MG) TBCR Take 4 tablets by mouth daily.     spironolactone (ALDACTONE) 25 MG tablet Take 1 tablet (25 mg total) by mouth daily. 90 tablet 3   No facility-administered medications prior to visit.      Allergies:   Other   Social History   Socioeconomic History   Marital status: Married    Spouse name: Not on file   Number of children: Not on file   Years of education: Not on file   Highest education level: Not on file  Occupational History   Not on file  Social Needs   Financial resource strain: Not on file   Food insecurity    Worry: Not on file    Inability: Not on file   Transportation needs    Medical: Not on file    Non-medical: Not on file  Tobacco Use   Smoking status: Never Smoker   Smokeless tobacco: Never Used  Substance and Sexual Activity   Alcohol use: Yes    Alcohol/week: 0.0 standard drinks    Comment: rare   Drug use: No   Sexual activity: Not on file  Lifestyle   Physical activity    Days per week: Not on file    Minutes per session: Not on file   Stress: Not on file  Relationships   Social connections    Talks on phone: Not on file    Gets together: Not on file    Attends religious service: Not on file    Active member of club or organization: Not on file    Attends meetings of clubs or organizations: Not on file    Relationship status: Not on file  Other Topics  Concern   Not on file  Social History Narrative   [place] in [place].  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   ROS:   Please see the history of present illness.    ROS All other systems reviewed and are negative.   PHYSICAL EXAM:   VS:  BP 122/80    Pulse 77    Ht 5\' 10"  (1.778 m)  Wt 203 lb 6.4 oz (92.3 kg)    BMI 29.18 kg/m    GEN: Well nourished, well developed, in no acute distress  HEENT: normal  Neck: no JVD, carotid bruits, or masses Cardiac: RRR; no murmurs, rubs, or gallops,no edema  Respiratory:  clear to auscultation bilaterally, normal work of breathing GI: soft, nontender, nondistended, + BS MS: no deformity or atrophy  Skin: warm and dry, no rash Neuro:  Alert and Oriented x 3, Strength and sensation are intact Psych: euthymic mood, full affect  Wt Readings from Last 3 Encounters:  05/12/19 203 lb 6.4 oz (92.3 kg)  04/28/19 204 lb (92.5 kg)  05/13/18 209 lb 12.8 oz (95.2 kg)      Studies/Labs Reviewed:   EKG:  EKG is not ordered today.    Recent Labs: 04/15/2019: Hemoglobin 14.4; Platelets 172 04/23/2019: ALT 22 05/07/2019: BUN 11; Creatinine, Ser 1.08; Potassium 4.4; Sodium 138   Lipid Panel    Component Value Date/Time   CHOL 162 04/23/2019 1005   TRIG 126 04/23/2019 1005   HDL 44 04/23/2019 1005   CHOLHDL 3.7 04/23/2019 1005   CHOLHDL 4 09/16/2014 1003   VLDL 13.8 09/16/2014 1003   LDLCALC 93 04/23/2019 1005    Additional studies/ records that were reviewed today include:   ETT 10/28/2017 Study Highlights    Blood pressure demonstrated a normal response to exercise.  No T wave inversion was noted during stress.  There was no ST segment deviation noted during stress.  Arrhythmias during stress: rare PVCs.  The test was stopped because the patient complained of fatigue  Overall, the patient's  exercise capacity was excellent  D[facility] Treadmill Score: low risk   Negative stress test without evidence of ischemia at given workload.      ASSESSMENT:    1. Pain of right lower extremity   2. Leg swelling   3. Essential hypertension   4. Paroxysmal atrial fibrillation (HCC)   5. Chronic fatigue      PLAN:  In order of problems listed above:  1. Right lower extremity pain: I will obtain a d-dimer to rule out DVT, if d-dimer is positive, will need a lower extremity venous Doppler.  This is a low risk patient to develop DVT, however he is concerned about some swelling in the back of his leg and also pain.  The leg pain constantly moves to different locations in the leg.  I suspect some of this may be related to compressed nerve as he noticed the symptom more often after laying on his back or sit for long time.  2. Hypertension: Blood pressure stable on current therapy  3. Paroxysmal atrial fibrillation: Maintained on aspirin and diltiazem.  4. Chronic fatigue: He says he is being referred by his nephrologist Dr. Joelyn Oms to a specialist in [facility] for further evaluation.    Medication Adjustments/Labs and Tests Ordered: Current medicines are reviewed at length with the patient today.  Concerns regarding medicines are outlined above.  Medication changes, Labs and Tests ordered today are listed in the Patient Instructions below. Patient Instructions  Medication Instructions:  Your physician recommends that you continue on your current medications as directed. Please refer to the Current Medication list given to you today.  If you need a refill on your cardiac medications before your next appointment, please call your pharmacy.   Lab work:  You will need to have labs (blood work) drawn today:  D-Dimer  If you have labs (blood work) drawn today  and your tests are completely normal, you will receive your results only by:  MyChart Message (if you have MyChart) OR  A paper  copy in the mail If you have any lab test that is abnormal or we need to change your treatment, we will call you to review the results.  Testing/Procedures: Your physician has requested that you have a lower or upper extremity venous duplex. This test is an ultrasound of the veins in the legs or arms. It looks at venous blood flow that carries blood from the heart to the legs or arms. Allow one hour for a Lower Venous exam. Allow thirty minutes for an Upper Venous exam. There are no restrictions or special instructions.  Follow-Up: At [facility], you and your health needs are our priority.  As part of our continuing mission to provide you with exceptional heart care, we have created designated Provider Care Teams.  These Care Teams include your primary Cardiologist (physician) and Advanced Practice Providers (APPs -  Physician Assistants and Nurse Practitioners) who all work together to provide you with the care you need, when you need it. Your physician recommends that you KEEP your scheduled follow-up appointment with Olga MillersBrian Crenshaw, MD in September 2020  Any Other Special Instructions Will Be Listed Below (If Applicable).       Ramond DialSigned, Ela Moffat, [place]  05/14/2019 12:50 AM    [facility] [address], [place], [place]  [address] Phone: [phone]) [phone]; Fax: [phone]) [phone]

## 2019-05-12 NOTE — Telephone Encounter (Signed)
New Message   Pt c/o medication issue:  1. Name of Medication: unknow  2. How are you currently taking this medication (dosage and times per day)?   3. Are you having a reaction (difficulty breathing--STAT)?   4. What is your medication issue? Patient states that he is experiencing some bruising but is unsure what medication my be causing the bruising.

## 2019-05-12 NOTE — Patient Instructions (Addendum)
Medication Instructions:  Your physician recommends that you continue on your current medications as directed. Please refer to the Current Medication list given to you today.  If you need a refill on your cardiac medications before your next appointment, please call your pharmacy.   Lab work:  You will need to have labs (blood work) drawn today:  D-Dimer  If you have labs (blood work) drawn today and your tests are completely normal, you will receive your results only by: Marland Kitchen MyChart Message (if you have MyChart) OR . A paper copy in the mail If you have any lab test that is abnormal or we need to change your treatment, we will call you to review the results.  Testing/Procedures: Your physician has requested that you have a lower or upper extremity venous duplex. This test is an ultrasound of the veins in the legs or arms. It looks at venous blood flow that carries blood from the heart to the legs or arms. Allow one hour for a Lower Venous exam. Allow thirty minutes for an Upper Venous exam. There are no restrictions or special instructions.  Follow-Up: At [facility], you and your health needs are our priority.  As part of our continuing mission to provide you with exceptional heart care, we have created designated Provider Care Teams.  These Care Teams include your primary Cardiologist (physician) and Advanced Practice Providers (APPs -  Physician Assistants and Nurse Practitioners) who all work together to provide you with the care you need, when you need it. Your physician recommends that you KEEP your scheduled follow-up appointment with Kirk Ruths, MD in September 2020  Any Other Special Instructions Will Be Listed Below (If Applicable).

## 2019-05-13 ENCOUNTER — Telehealth: Payer: Self-pay | Admitting: Cardiology

## 2019-05-13 LAB — D-DIMER, QUANTITATIVE: D-DIMER: 0.5 mg/L FEU — ABNORMAL HIGH (ref 0.00–0.49)

## 2019-05-13 NOTE — Progress Notes (Signed)
I have called the patient, will obtain venous doppler of leg

## 2019-05-13 NOTE — Telephone Encounter (Signed)
Patient called and asked about the results of his lab work that were done on 05/12/19.

## 2019-05-13 NOTE — Telephone Encounter (Signed)
Spoke with pt and informed that D-dimer results not yet available, but he will be made aware of the results once they are available. Pt verbalized understanding

## 2019-05-14 ENCOUNTER — Encounter: Payer: Self-pay | Admitting: Physician Assistant

## 2019-05-17 ENCOUNTER — Other Ambulatory Visit: Payer: Self-pay

## 2019-05-17 ENCOUNTER — Ambulatory Visit (HOSPITAL_COMMUNITY)
Admission: RE | Admit: 2019-05-17 | Discharge: 2019-05-17 | Disposition: A | Discharge status: Home / Self Care | Payer: BC Managed Care – PPO | Source: Ambulatory Visit | Attending: Cardiology | Admitting: Cardiology

## 2019-05-17 DIAGNOSIS — M7989 Other specified soft tissue disorders: Secondary | ICD-10-CM

## 2019-05-17 DIAGNOSIS — M79604 Pain in right leg: Secondary | ICD-10-CM | POA: Insufficient documentation

## 2019-05-17 NOTE — Progress Notes (Signed)
The patient has been notified of the result and verbalized understanding.  All questions (if any) were answered. Kevin Holmes, [place] 05/17/2019 4:34 PM

## 2019-05-20 DIAGNOSIS — Z91018 Allergy to other foods: Secondary | ICD-10-CM | POA: Diagnosis not present

## 2019-05-20 DIAGNOSIS — I10 Essential (primary) hypertension: Secondary | ICD-10-CM | POA: Diagnosis not present

## 2019-05-20 DIAGNOSIS — E785 Hyperlipidemia, unspecified: Secondary | ICD-10-CM | POA: Diagnosis not present

## 2019-05-20 DIAGNOSIS — E876 Hypokalemia: Secondary | ICD-10-CM | POA: Diagnosis not present

## 2019-05-24 DIAGNOSIS — I10 Essential (primary) hypertension: Secondary | ICD-10-CM | POA: Diagnosis not present

## 2019-05-24 DIAGNOSIS — R5383 Other fatigue: Secondary | ICD-10-CM | POA: Diagnosis not present

## 2019-05-24 DIAGNOSIS — I4891 Unspecified atrial fibrillation: Secondary | ICD-10-CM | POA: Diagnosis not present

## 2019-05-24 DIAGNOSIS — Z87442 Personal history of urinary calculi: Secondary | ICD-10-CM | POA: Diagnosis not present

## 2019-05-24 DIAGNOSIS — E559 Vitamin D deficiency, unspecified: Secondary | ICD-10-CM | POA: Diagnosis not present

## 2019-06-01 ENCOUNTER — Telehealth: Payer: Self-pay | Admitting: Cardiology

## 2019-06-01 NOTE — Telephone Encounter (Signed)
Spoke with pt, Aware of dr Jacalyn Lefevre recommendations. Recommended to patient to spilt the medications up and take some in the evening and some in the morning. He will try taking his bp when he is lightheaded as well to see if it is dropping too low. He will continue to monitor and let us know how he is doing.

## 2019-06-01 NOTE — Telephone Encounter (Signed)
Blood pressure okay.  Continue present medications and monitor. Kirk Ruths

## 2019-06-01 NOTE — Telephone Encounter (Signed)
Returned call to patient he stated his B/P has been lower since he started taking Spironolactone 25 mg daily.B/P today 108/83.Pulse ranging 53 to 68.B/P readings listed below.He is concerned his B/P is lower.He feels no worse.Stated something is wrong he has been feeling tired, no energy for the past 1 year.Stated he saw a integrative health Dr.last week that ordered a lot of lab work.He is waiting on results.Stated he wanted to ask Dr.Crenshaw if B/P medication needs to be reduced.Message sent to [facility] for advice.

## 2019-06-01 NOTE — Telephone Encounter (Signed)
New Message   Pt c/o BP issue:  1. What are your last 5 BP readings? 108/83 (Pre meds), 100/70, 119/85, 107/75 (7:05pm on 7/20) 2. Are you having any other symptoms (ex. Dizziness, headache, blurred vision, passed out)? Feel lightheaded upon standing  3. What is your medication issue? He believes his medication is making his BP too low for him.

## 2019-06-02 NOTE — Addendum Note (Signed)
Addended by: Cristopher Estimable on: 06/02/2019 10:19 AM   Modules accepted: Orders

## 2019-06-02 NOTE — Telephone Encounter (Signed)
Patient would like to speak to nurse before he take anymore medication this morning.

## 2019-06-02 NOTE — Telephone Encounter (Signed)
DC spironolactone; change lisinopril to 20 mg daily and follow BP Kirk Ruths

## 2019-06-02 NOTE — Telephone Encounter (Signed)
Spoke with pt, he felt bad last night. He took his bp last night at 9 pm his bp was 103/70 sitting and the 84/69 standing. At 10 pm his bp was 116/78 and he felt better. This morning at 7 am this morning 98/77 and then at 8 am 130/87. He did not take any medications last night or this morning. Patient advised to take the spironolactone this morning and nothing else. Will forward for dr Stanford Breed review

## 2019-06-02 NOTE — Telephone Encounter (Signed)
Spoke with pt, Aware of dr crenshaw's recommendations.  °

## 2019-06-20 DIAGNOSIS — R21 Rash and other nonspecific skin eruption: Secondary | ICD-10-CM | POA: Diagnosis not present

## 2019-06-20 DIAGNOSIS — L0390 Cellulitis, unspecified: Secondary | ICD-10-CM | POA: Diagnosis not present

## 2019-06-20 DIAGNOSIS — R590 Localized enlarged lymph nodes: Secondary | ICD-10-CM | POA: Diagnosis not present

## 2019-06-20 DIAGNOSIS — S30862A Insect bite (nonvenomous) of penis, initial encounter: Secondary | ICD-10-CM | POA: Diagnosis not present

## 2019-06-23 DIAGNOSIS — B2790 Infectious mononucleosis, unspecified without complication: Secondary | ICD-10-CM | POA: Diagnosis not present

## 2019-06-23 DIAGNOSIS — E559 Vitamin D deficiency, unspecified: Secondary | ICD-10-CM | POA: Diagnosis not present

## 2019-06-23 DIAGNOSIS — E063 Autoimmune thyroiditis: Secondary | ICD-10-CM | POA: Diagnosis not present

## 2019-06-23 DIAGNOSIS — R5383 Other fatigue: Secondary | ICD-10-CM | POA: Diagnosis not present

## 2019-06-24 DIAGNOSIS — E876 Hypokalemia: Secondary | ICD-10-CM | POA: Diagnosis not present

## 2019-06-24 DIAGNOSIS — I10 Essential (primary) hypertension: Secondary | ICD-10-CM | POA: Diagnosis not present

## 2019-06-24 DIAGNOSIS — Z6829 Body mass index (BMI) 29.0-29.9, adult: Secondary | ICD-10-CM | POA: Diagnosis not present

## 2019-06-24 DIAGNOSIS — N200 Calculus of kidney: Secondary | ICD-10-CM | POA: Diagnosis not present

## 2019-06-25 DIAGNOSIS — E876 Hypokalemia: Secondary | ICD-10-CM | POA: Diagnosis not present

## 2019-06-25 DIAGNOSIS — Z91018 Allergy to other foods: Secondary | ICD-10-CM | POA: Diagnosis not present

## 2019-06-25 DIAGNOSIS — I10 Essential (primary) hypertension: Secondary | ICD-10-CM | POA: Diagnosis not present

## 2019-06-25 DIAGNOSIS — E785 Hyperlipidemia, unspecified: Secondary | ICD-10-CM | POA: Diagnosis not present

## 2019-07-14 DIAGNOSIS — E876 Hypokalemia: Secondary | ICD-10-CM | POA: Diagnosis not present

## 2019-07-21 NOTE — Progress Notes (Signed)
Virtual Visit via Video Note    This visit type was conducted due to national recommendations for restrictions regarding the COVID-19 Pandemic (e.g. social distancing) in an effort to limit this patient's exposure and mitigate transmission in our community.  Due to his co-morbid illnesses, this patient is at least at moderate risk for complications without adequate follow up.  This format is felt to be most appropriate for this patient at this time.  All issues noted in this document were discussed and addressed.  A limited physical exam was performed with this format.  Please refer to the patient's chart for his consent to telehealth for [facility].    HPI: FU atrial fibrillation. Echocardiogram in September of 2013 showed normal LV function. Flecainide added for recurrent symptomatic atrial fibrillation but arrhythmia recurred. A followup nuclear study showed mild apical ischemia. Cardiac CT in November of 2013 showed no coronary disease and a calcium score of 0. Patient subsequently had atrial fibrillation ablation. ETT 12/18normal. Toprol discontinued previously because of fatigue and bradycardia. Lower extremity venous Dopplers July 2020 showed no DVT.  Since he was last seenpatient denies dyspnea or syncope.  Occasional left shoulder pain not exertional and chronic.  Occasional brief palpitations.  He complains of elevated blood pressure in the evening and morning.  This is associated with headaches.  He also has generalized fatigue.  Current Outpatient Medications  Medication Sig Dispense Refill   aspirin EC 81 MG tablet Take 1 tablet (81 mg total) by mouth daily. 90 tablet 3   diltiazem (TIAZAC) 360 MG 24 hr capsule TAKE 1 CAPSULE DAILY (Patient taking differently: Take 360 mg by mouth daily. ) 90 capsule 4   lisinopril (PRINIVIL,ZESTRIL) 40 MG tablet TAKE 1 TABLET DAILY (Patient taking differently: Take 20 mg by mouth daily. ) 90 tablet 3   Potassium Citrate 15 MEQ (1620 MG) TBCR  Take 4 tablets by mouth daily.     No current facility-administered medications for this visit.      Past Medical History:  Diagnosis Date   History of gallstones    LAPAROSCOPIC CHOLECYSTECTOMY 2005   Hypertension    Paroxysmal atrial fibrillation (HCC)    Pure hypercholesterolemia     Past Surgical History:  Procedure Laterality Date   ATRIAL FIBRILLATION ABLATION N/A 10/20/2012   Procedure: ATRIAL FIBRILLATION ABLATION;  Surgeon: Hillis RangeJames Allred, MD;  [facility];  Service: Cardiovascular;  Laterality: N/A;   LAMINECTOMY     LAPAROSCOPIC CHOLECYSTECTOMY  07/04/2004 [place]   DR. PAUL TOTH   TEE WITHOUT CARDIOVERSION  10/19/2012   Procedure: TRANSESOPHAGEAL ECHOCARDIOGRAM (TEE);  Surgeon: Pricilla RifflePaula V Ross, MD;  [facility];  Service: Cardiovascular;  Laterality: N/A;    Social History   Socioeconomic History   Marital status: Married    Spouse name: Not on file   Number of children: Not on file   Years of education: Not on file   Highest education level: Not on file  Occupational History   Not on file  Social Needs   Financial resource strain: Not on file   Food insecurity    Worry: Not on file    Inability: Not on file   Transportation needs    Medical: Not on file    Non-medical: Not on file  Tobacco Use   Smoking status: Never Smoker   Smokeless tobacco: Never Used  Substance and Sexual Activity   Alcohol use: Yes    Alcohol/week: 0.0 standard drinks    Comment: rare  Drug use: No   Sexual activity: Not on file  Lifestyle   Physical activity    Days per week: Not on file    Minutes per session: Not on file   Stress: Not on file  Relationships   Social connections    Talks on phone: Not on file    Gets together: Not on file    Attends religious service: Not on file    Active member of club or organization: Not on file    Attends meetings of clubs or organizations: Not on file    Relationship status: Not on file     Intimate partner violence    Fear of current or ex partner: Not on file    Emotionally abused: Not on file    Physically abused: Not on file    Forced sexual activity: Not on file  Other Topics Concern   Not on file  Social History Narrative   Lives in [place].  Works at [employer] as an Chief Financial Officer    Family History  Problem Relation Age of Onset   Hypertension Father    Heart attack Paternal Grandfather 6    ROS: Fatigue but no fevers or chills, productive cough, hemoptysis, dysphasia, odynophagia, melena, hematochezia, dysuria, hematuria, rash, seizure activity, orthopnea, PND, pedal edema, claudication. Remaining systems are negative.  Physical Exam: Vital signs reviewed. Answers questions appropriately Normal affect Remainder physical examination not performed (coronavirus pandemic).  A/P  1 paroxysmal atrial fibrillation-patient remains in sinus rhythm by history.  I discontinued Toprol previously because of fatigue and bradycardia.  Continue Cardizem.  Anticoagulation previously discontinued.  2 hypertension-patient's blood pressure is mildly elevated and he states is elevated at home.  Increase lisinopril to 40 mg daily.  Check potassium and renal function in 1 week.  Follow blood pressure and adjust regimen as needed.  3 history of palpitations-symptoms reasonably well controlled.  Continue Cardizem.  4 hyperlipidemia-follow-up primary care.  5 history of chest pain-patient has an occasional pain in left shoulder area which is chronic and unchanged.  No plans for further ischemia evaluation at this point.  6 chronic fatigue-patient is being evaluated in [facility] for possible hyperaldosteronism.  He is also concerned about MS.  May need neurology evaluation but I will leave to primary care.  7 mildly elevated alkaline phosphatase-repeat liver functions.  18 minutes spent with patient on visit today.  Kirk Ruths, MD

## 2019-07-23 ENCOUNTER — Other Ambulatory Visit: Payer: Self-pay | Admitting: Cardiology

## 2019-07-26 ENCOUNTER — Telehealth (INDEPENDENT_AMBULATORY_CARE_PROVIDER_SITE_OTHER): Payer: BC Managed Care – PPO | Admitting: Cardiology

## 2019-07-26 VITALS — BP 140/87 | HR 64 | Ht 70.0 in | Wt 200.0 lb

## 2019-07-26 DIAGNOSIS — I480 Paroxysmal atrial fibrillation: Secondary | ICD-10-CM | POA: Diagnosis not present

## 2019-07-26 DIAGNOSIS — I10 Essential (primary) hypertension: Secondary | ICD-10-CM | POA: Diagnosis not present

## 2019-07-26 DIAGNOSIS — R5382 Chronic fatigue, unspecified: Secondary | ICD-10-CM | POA: Diagnosis not present

## 2019-07-26 DIAGNOSIS — R002 Palpitations: Secondary | ICD-10-CM | POA: Diagnosis not present

## 2019-07-26 MED ORDER — LISINOPRIL 40 MG PO TABS: 40.0000 mg | ORAL_TABLET | Freq: Every day | ORAL | 3 refills | Status: DC

## 2019-07-26 NOTE — Patient Instructions (Signed)
Medication Instructions:  Increase Lisinopril to 40 mg at night.  If you need a refill on your cardiac medications before your next appointment, please call your pharmacy.   Lab work: CMET in 1 week.  Lab hours: 8:00-4:00 lunch hours 12:45-1:45 (no appointment needed, walk-in only)  If you have labs (blood work) drawn today and your tests are completely normal, you will receive your results only by: Marland Kitchen MyChart Message (if you have MyChart) OR . A paper copy in the mail If you have any lab test that is abnormal or we need to change your treatment, we will call you to review the results.  Follow-Up: At [facility], you and your health needs are our priority.  As part of our continuing mission to provide you with exceptional heart care, we have created designated Provider Care Teams.  These Care Teams include your primary Cardiologist (physician) and Advanced Practice Providers (APPs -  Physician Assistants and Nurse Practitioners) who all work together to provide you with the care you need, when you need it. You will need a follow up appointment in 6 months.  Please call our office 2 months in advance to schedule this appointment.  You may see Kirk Ruths, MD or one of the following Advanced Practice Providers on your designated Care Team:   Kerin Ransom, PA-C Roby Lofts, [place] . Sande Rives, PA-C

## 2019-08-02 DIAGNOSIS — E876 Hypokalemia: Secondary | ICD-10-CM | POA: Diagnosis not present

## 2019-08-04 DIAGNOSIS — E876 Hypokalemia: Secondary | ICD-10-CM | POA: Diagnosis not present

## 2019-08-04 DIAGNOSIS — I10 Essential (primary) hypertension: Secondary | ICD-10-CM | POA: Diagnosis not present

## 2019-08-04 DIAGNOSIS — N200 Calculus of kidney: Secondary | ICD-10-CM | POA: Diagnosis not present

## 2019-08-04 DIAGNOSIS — R82992 Hyperoxaluria: Secondary | ICD-10-CM | POA: Diagnosis not present

## 2019-08-13 DIAGNOSIS — Z20828 Contact with and (suspected) exposure to other viral communicable diseases: Secondary | ICD-10-CM | POA: Diagnosis not present

## 2019-08-13 DIAGNOSIS — Z1159 Encounter for screening for other viral diseases: Secondary | ICD-10-CM | POA: Diagnosis not present

## 2019-08-18 DIAGNOSIS — E876 Hypokalemia: Secondary | ICD-10-CM | POA: Diagnosis not present

## 2019-08-20 DIAGNOSIS — Z20828 Contact with and (suspected) exposure to other viral communicable diseases: Secondary | ICD-10-CM | POA: Diagnosis not present

## 2019-10-21 ENCOUNTER — Other Ambulatory Visit: Payer: Self-pay | Admitting: Cardiology

## 2019-10-21 DIAGNOSIS — Z20828 Contact with and (suspected) exposure to other viral communicable diseases: Secondary | ICD-10-CM | POA: Diagnosis not present

## 2019-10-21 DIAGNOSIS — I4891 Unspecified atrial fibrillation: Secondary | ICD-10-CM

## 2019-11-15 DIAGNOSIS — E876 Hypokalemia: Secondary | ICD-10-CM | POA: Diagnosis not present

## 2019-11-22 DIAGNOSIS — I10 Essential (primary) hypertension: Secondary | ICD-10-CM | POA: Diagnosis not present

## 2019-11-22 DIAGNOSIS — R82992 Hyperoxaluria: Secondary | ICD-10-CM | POA: Diagnosis not present

## 2019-11-22 DIAGNOSIS — E876 Hypokalemia: Secondary | ICD-10-CM | POA: Diagnosis not present

## 2019-11-22 DIAGNOSIS — N200 Calculus of kidney: Secondary | ICD-10-CM | POA: Diagnosis not present

## 2019-11-30 DIAGNOSIS — Z23 Encounter for immunization: Secondary | ICD-10-CM | POA: Diagnosis not present

## 2019-12-17 DIAGNOSIS — Z20828 Contact with and (suspected) exposure to other viral communicable diseases: Secondary | ICD-10-CM | POA: Diagnosis not present

## 2019-12-28 DIAGNOSIS — Z23 Encounter for immunization: Secondary | ICD-10-CM | POA: Diagnosis not present

## 2020-01-06 DIAGNOSIS — Z91018 Allergy to other foods: Secondary | ICD-10-CM | POA: Diagnosis not present

## 2020-01-06 DIAGNOSIS — I10 Essential (primary) hypertension: Secondary | ICD-10-CM | POA: Diagnosis not present

## 2020-01-06 DIAGNOSIS — E785 Hyperlipidemia, unspecified: Secondary | ICD-10-CM | POA: Diagnosis not present

## 2020-01-06 DIAGNOSIS — R1013 Epigastric pain: Secondary | ICD-10-CM | POA: Diagnosis not present

## 2020-01-07 DIAGNOSIS — E785 Hyperlipidemia, unspecified: Secondary | ICD-10-CM | POA: Diagnosis not present

## 2020-01-07 DIAGNOSIS — R1013 Epigastric pain: Secondary | ICD-10-CM | POA: Diagnosis not present

## 2020-01-12 ENCOUNTER — Encounter: Payer: Self-pay | Admitting: Adult Health

## 2020-01-12 ENCOUNTER — Ambulatory Visit: Payer: BC Managed Care – PPO | Admitting: Adult Health

## 2020-01-12 ENCOUNTER — Telehealth: Payer: Self-pay | Admitting: Cardiology

## 2020-01-12 ENCOUNTER — Other Ambulatory Visit: Payer: Self-pay

## 2020-01-12 VITALS — BP 108/64 | HR 62 | Wt 212.8 lb

## 2020-01-12 DIAGNOSIS — R5383 Other fatigue: Secondary | ICD-10-CM

## 2020-01-12 DIAGNOSIS — I10 Essential (primary) hypertension: Secondary | ICD-10-CM

## 2020-01-12 DIAGNOSIS — R072 Precordial pain: Secondary | ICD-10-CM | POA: Diagnosis not present

## 2020-01-12 NOTE — Telephone Encounter (Signed)
Pt c/o of Chest Pain: STAT if CP now or developed within 24 hours  1. Are you having CP right now? Not right now comes and goes  2. Are you experiencing any other symptoms (ex. SOB, nausea, vomiting, sweating)? No   3. How long have you been experiencing CP? Been going on for a long time, but seems to be getting worse  4. Is your CP continuous or coming and going? Coming and going **  5. Have you taken Nitroglycerin? no ?

## 2020-01-12 NOTE — Progress Notes (Signed)
Cardiology Office Note   Date:  01/12/2020   ID:  [name], [name] Jul 31, 1966, MRN [id number]  PCP:  [name], MD  Cardiologist:  Winfield Rast  CC: Chest Pain    History of Present Illness: Kevin Holmes is a 54 y.o. male who presents for ongoing assessment and management of atrial fibrillation, failed flecainide therapy.  Follow-up nuclear study revealed mild ischemia but cardiac CT in November of 2013 showed no coronary artery disease with a calcium score of 0.  Toprol was discontinued due to fatigue and bradycardia.  He was seen last by Dr. Jens Som on 07/26/2019 at which time the patient was stable and remained in normal sinus rhythm.  He was no longer on anticoagulation, he was continued on diltiazem.  Blood pressure was slightly elevated and therefore lisinopril was increased to 40 mg daily.  Since being seen last lisinopril was discontinued due to hyperkalemia, he was continued on spironolactone.  He remains on diltiazem 360 mg daily.  He has recently been started on rosuvastatin by his PCP due to elevated LDL of 154 and total cholesterol greater than 200.    He comes today with multiple somatic complaints.  Chest pressure, left shoulder pressure, nausea in the morning, no energy, and lightheadedness.  Blood pressures been running low at home, highest is in the 130/60 but usually much lower.  He said he tried to go to the gym a couple of weeks ago as he had not been in almost a year, and stated that he became very tired during his workout and has had concerns about his cardiovascular system causing this fatigue and inability to exercise.  He states that he can mow his lawn without problems and about 20 minutes.  But lately he just has had no energy.  Due to his ongoing issues with nausea and substernal chest pain he is also going to be seen by GI specialist.  He remains on PPI.  Past Medical History:  Diagnosis Date  . History of gallstones    LAPAROSCOPIC CHOLECYSTECTOMY 2005   . Hypertension   . Paroxysmal atrial fibrillation (HCC)   . Pure hypercholesterolemia     Past Surgical History:  Procedure Laterality Date  . ATRIAL FIBRILLATION ABLATION N/A 10/20/2012   Procedure: ATRIAL FIBRILLATION ABLATION;  Surgeon: Hillis Range, MD;  Location: [facility] CATH LAB;  Service: Cardiovascular;  Laterality: N/A;  . LAMINECTOMY    . LAPAROSCOPIC CHOLECYSTECTOMY  07/04/2004 [facility]   DR. PAUL TOTH  . TEE WITHOUT CARDIOVERSION  10/19/2012   Procedure: TRANSESOPHAGEAL ECHOCARDIOGRAM (TEE);  Surgeon: Pricilla Riffle, MD;  Location: [facility] ENDOSCOPY;  Service: Cardiovascular;  Laterality: N/A;     Current Outpatient Medications  Medication Sig Dispense Refill  . aspirin EC 81 MG tablet Take 1 tablet (81 mg total) by mouth daily. 90 tablet 3  . Coenzyme Q10 (COQ10 PO) Take 300 mg by mouth.    . diltiazem (TIAZAC) 360 MG 24 hr capsule Take 1 capsule (360 mg total) by mouth daily. 90 capsule 3  . omeprazole (PRILOSEC) 40 MG capsule Take 40 mg by mouth daily.    . Potassium Citrate 15 MEQ (1620 MG) TBCR Take 4 tablets by mouth daily.    . rosuvastatin (CRESTOR) 20 MG tablet Take 20 mg by mouth at bedtime.    Marland Kitchen spironolactone (ALDACTONE) 25 MG tablet Take 25 mg by mouth daily.     No current facility-administered medications for this visit.    Allergies:   Other  Social History:  The patient  reports that he has never smoked. He has never used smokeless tobacco. He reports current alcohol use. He reports that he does not use drugs.   Family History:  The patient's family history includes Heart attack (age of onset: 83) in his paternal grandfather; Hypertension in his father.    ROS: All other systems are reviewed and negative. Unless otherwise mentioned in H&P    PHYSICAL EXAM: VS:  BP 108/64   Pulse 62   Wt 212 lb 12.8 oz (96.5 kg)   BMI 30.53 kg/m  , BMI Body mass index is 30.53 kg/m. GEN: Well nourished, well developed, in no acute distress HEENT: normal Neck: no JVD,  carotid bruits, or masses Cardiac: RRR; no murmurs, rubs, or gallops,no edema  Respiratory:  Clear to auscultation bilaterally, normal work of breathing GI: soft, nontender, nondistended, + BS MS: no deformity or atrophy Skin: warm and dry, no rash Neuro:  Strength and sensation are intact Psych: euthymic mood, full affect   EKG: Normal sinus rhythm heart rate of 62 bpm  Recent Labs: 04/15/2019: Hemoglobin 14.4; Platelets 172 04/23/2019: ALT 22 05/07/2019: BUN 11; Creatinine, Ser 1.08; Potassium 4.4; Sodium 138    Lipid Panel    Component Value Date/Time   CHOL 162 04/23/2019 1005   TRIG 126 04/23/2019 1005   HDL 44 04/23/2019 1005   CHOLHDL 3.7 04/23/2019 1005   CHOLHDL 4 09/16/2014 1003   VLDL 13.8 09/16/2014 1003   LDLCALC 93 04/23/2019 1005      Wt Readings from Last 3 Encounters:  01/12/20 212 lb 12.8 oz (96.5 kg)  07/26/19 200 lb (90.7 kg)  05/12/19 203 lb 6.4 oz (92.3 kg)      Other studies Reviewed:  Stress Test 10/28/2017  Blood pressure demonstrated a normal response to exercise.  No T wave inversion was noted during stress.  There was no ST segment deviation noted during stress.  Arrhythmias during stress: rare PVCs.  The test was stopped because the patient complained of fatigue  Overall, the patient's exercise capacity was excellent  [facility] Treadmill Score: low risk   ASSESSMENT AND PLAN:  1.  Hypertension: He has been taken off of lisinopril and continued on spironolactone.  He states he feels better off of the lisinopril.  He still remains hypotensive at times.  He continues on diltiazem 360 mg daily.  May need to consider decreasing this dose if he remains hypotensive.  We will repeat echocardiogram for evaluation of LV function.  2.  Generalized fatigue: I will check a vitamin D level and a TSH as that has not been recently completed by his PCP.  Uncertain if this is from deconditioning and inactivity, depression, or some other etiology.  3.   Recurrent chest pain: Typical and atypical symptoms mostly described as aching in his left shoulder and left upper chest and some epigastric substernal.  He has not had any cardiac testing since 2007.  With history of hypercholesterolemia, hypertension, age, and family history, I will recheck him using a nuclear medicine stress test for diagnostic prognostic purposes   Current medicines are reviewed at length with the patient today.  I have spent 35 minutes dedicated to the care of this patient on the date of this encounter to include pre-visit review of records, assessment, management and diagnostic testing,with shared decision making.  Labs/ tests ordered today include: Echocardiogram, exercise Myoview, TSH, vitamin D.  Kevin Holmes. Kevin Holmes, ANP, AACC   01/12/2020 3:08 PM  Kevin Holmes [phone] Fax [phone]  Notice: This dictation was prepared with Dragon dictation along with smaller phrase technology. Any transcriptional errors that result from this process are unintentional and may not be corrected upon review.

## 2020-01-12 NOTE — Telephone Encounter (Signed)
Spoke with pt, he reports for the last few weeks waking with nausea. He has an appointment with GI regarding this. He has also been having upper left chest and arm pit pain. He reports it is happening more frequently. The time it last varies and can be sharpe, dull or feeling like a squeezing pain. Can occur with rest, he has not been exercising because of this going on. Does not occur with walking. He also reports occasional tingling in his fingers on the left hand. He is anxious and would like to be seen today. Follow up scheduled

## 2020-01-12 NOTE — Patient Instructions (Signed)
Medication Instructions:  Continue current medications  *If you need a refill on your cardiac medications before your next appointment, please call your pharmacy*   Lab Work: TSH and Vit D Level  If you have labs (blood work) drawn today and your tests are completely normal, you will receive your results only by: Marland Kitchen MyChart Message (if you have MyChart) OR . A paper copy in the mail If you have any lab test that is abnormal or we need to change your treatment, we will call you to review the results.   Testing/Procedures: Your physician has requested that you have an echocardiogram. Echocardiography is a painless test that uses sound waves to create images of your heart. It provides your doctor with information about the size and shape of your heart and how well your heart's chambers and valves are working. This procedure takes approximately one hour. There are no restrictions for this procedure.  Your physician has requested that you have an exercise stress myoview. For further information please visit [email or web address] Please follow instruction sheet, as given.  Follow-Up: At [facility], you and your health needs are our priority.  As part of our continuing mission to provide you with exceptional heart care, we have created designated Provider Care Teams.  These Care Teams include your primary Cardiologist (physician) and Advanced Practice Providers (APPs -  Physician Assistants and Nurse Practitioners) who all work together to provide you with the care you need, when you need it.  We recommend signing up for the patient portal called "MyChart".  Sign up information is provided on this After Visit Summary.  MyChart is used to connect with patients for Virtual Visits (Telemedicine).  Patients are able to view lab/test results, encounter notes, upcoming appointments, etc.  Non-urgent messages can be sent to your provider as well.   To learn more about what you can do with MyChart, go to  [email or web address].    Your next appointment:   Keep appointment with Dr Jens Som on March 30th @ 9:40 am  The format for your next appointment:   In Person  Provider:   Olga Millers, MD

## 2020-01-13 ENCOUNTER — Telehealth: Payer: Self-pay | Admitting: *Deleted

## 2020-01-13 LAB — TSH: TSH: 1.69 u[IU]/mL (ref 0.450–4.500)

## 2020-01-13 LAB — VITAMIN D 25 HYDROXY (VIT D DEFICIENCY, FRACTURES): Vit D, 25-Hydroxy: 20.8 ng/mL — ABNORMAL LOW (ref 30.0–100.0)

## 2020-01-13 MED ORDER — VITAMIN D (ERGOCALCIFEROL) 1.25 MG (50000 UNIT) PO CAPS: 50000.0000 [IU] | ORAL_CAPSULE | ORAL | 0 refills | Status: DC

## 2020-01-13 NOTE — Telephone Encounter (Signed)
Vit D 50,000 unit send to pt pharmacy, pt viewed result in MyChart

## 2020-01-13 NOTE — Telephone Encounter (Signed)
-----   Message from Jodelle Gross, NP sent at 01/13/2020  7:55 AM EST ----- Vitamin D is low. He will need weekly supplements. Begin Vitamin D3 supplements, 1.25 mg (5000 UT) caps. One capsule once a week for 3 months (#90).  See PCP for need for refills.

## 2020-01-17 NOTE — Addendum Note (Signed)
Addended by: Adline Peals on: 01/17/2020 04:47 PM   Modules accepted: Orders

## 2020-01-21 DIAGNOSIS — R110 Nausea: Secondary | ICD-10-CM | POA: Diagnosis not present

## 2020-01-21 DIAGNOSIS — R1013 Epigastric pain: Secondary | ICD-10-CM | POA: Diagnosis not present

## 2020-01-26 NOTE — Progress Notes (Signed)
Virtual Visit via Video Note changed to phone visit at patient request   This visit type was conducted due to national recommendations for restrictions regarding the COVID-19 Pandemic (e.g. social distancing) in an effort to limit this patient's exposure and mitigate transmission in our community.  Due to his co-morbid illnesses, this patient is at least at moderate risk for complications without adequate follow up.  This format is felt to be most appropriate for this patient at this time.  All issues noted in this document were discussed and addressed.  A limited physical exam was performed with this format.  Please refer to the patient's chart for his consent to telehealth for [facility].   Date:  02/08/2020   ID:  Kevin Holmes, DOB 08-16-1966, MRN [id number]  Patient Location:Home Provider Location: Home  PCP:  Vernie Shanks, MD  Cardiologist:  Dr Stanford Breed  Evaluation Performed:  Follow-Up Visit  Chief Complaint:  FU atrial fibrillation  History of Present Illness:    FU atrial fibrillation. Echocardiogram in September of 2013 showed normal LV function. Flecainide added for recurrent symptomatic atrial fibrillation but arrhythmia recurred. A followup nuclear study showed mild apical ischemia. Cardiac CT in November of 2013 showed no coronary disease and a calcium score of 0. Patient subsequently had atrial fibrillation ablation. ETT 12/18normal.Toprol discontinued previously because of fatigue and bradycardia. Lower extremity venous Dopplers July 2020 showed no DVT. Lisinopril discontinued previously because of hypotension.  Vitamin D level noted to be low.  Nuclear study March 2021 showed ejection fraction 60% with normal perfusion.  Echocardiogram March 2021 showed normal LV function, mild left ventricular hypertrophy.  Since he was last seenpatient denies dyspnea or syncope.  Occasional brief flutters but no sustained palpitations.  He has occasional pain under his left breast  not related to activities.  Some increase after eating.  The patient does not have symptoms concerning for COVID-19 infection (fever, chills, cough, or new shortness of breath).    Past Medical History:  Diagnosis Date  . History of gallstones    LAPAROSCOPIC CHOLECYSTECTOMY 2005  . Hypertension   . Paroxysmal atrial fibrillation (HCC)   . Pure hypercholesterolemia    Past Surgical History:  Procedure Laterality Date  . ATRIAL FIBRILLATION ABLATION N/A 10/20/2012   Procedure: ATRIAL FIBRILLATION ABLATION;  Surgeon: Thompson Grayer, MD;  Location: [facility] CATH LAB;  Service: Cardiovascular;  Laterality: N/A;  . LAMINECTOMY    . LAPAROSCOPIC CHOLECYSTECTOMY  07/04/2004 [facility]   DR. PAUL TOTH  . TEE WITHOUT CARDIOVERSION  10/19/2012   Procedure: TRANSESOPHAGEAL ECHOCARDIOGRAM (TEE);  Surgeon: Fay Records, MD;  Location: [facility] ENDOSCOPY;  Service: Cardiovascular;  Laterality: N/A;     Current Meds  Medication Sig  . aspirin EC 81 MG tablet Take 1 tablet (81 mg total) by mouth daily.  . Coenzyme Q10 (COQ10 PO) Take 300 mg by mouth.  . diltiazem (TIAZAC) 360 MG 24 hr capsule Take 1 capsule (360 mg total) by mouth daily.  Marland Kitchen omeprazole (PRILOSEC) 40 MG capsule Take 40 mg by mouth daily.  . Potassium Citrate 15 MEQ (1620 MG) TBCR Take 4 tablets by mouth daily.  . rosuvastatin (CRESTOR) 20 MG tablet Take 20 mg by mouth at bedtime.  Marland Kitchen spironolactone (ALDACTONE) 25 MG tablet Take 25 mg by mouth daily.  . Vitamin D, Ergocalciferol, (DRISDOL) 1.25 MG (50000 UNIT) CAPS capsule Take 1 capsule (50,000 Units total) by mouth every 7 (seven) days.     Allergies:   Other  Social History   Tobacco Use  . Smoking status: Never Smoker  . Smokeless tobacco: Never Used  Substance Use Topics  . Alcohol use: Yes    Alcohol/week: 0.0 standard drinks    Comment: rare  . Drug use: No     Family Hx: The patient's family history includes Heart attack (age of onset: 70) in his paternal grandfather; Hypertension  in his father.  ROS:   Please see the history of present illness.    No Fever, chills  or productive cough All other systems reviewed and are negative.   Recent Labs: 04/15/2019: Hemoglobin 14.4; Platelets 172 04/23/2019: ALT 22 05/07/2019: BUN 11; Creatinine, Ser 1.08; Potassium 4.4; Sodium 138 01/12/2020: TSH 1.690   Recent Lipid Panel Lab Results  Component Value Date/Time   CHOL 162 04/23/2019 10:05 AM   TRIG 126 04/23/2019 10:05 AM   HDL 44 04/23/2019 10:05 AM   CHOLHDL 3.7 04/23/2019 10:05 AM   CHOLHDL 4 09/16/2014 10:03 AM   LDLCALC 93 04/23/2019 10:05 AM    Wt Readings from Last 3 Encounters:  02/08/20 210 lb (95.3 kg)  02/01/20 212 lb (96.2 kg)  01/12/20 212 lb 12.8 oz (96.5 kg)     Objective:    Vital Signs:  BP 133/79   Pulse 65   Ht 5\' 10"  (1.778 m)   Wt 210 lb (95.3 kg)   BMI 30.13 kg/m    VITAL SIGNS:  reviewed NAD Answers questions appropriately Normal affect Remainder of physical examination not performed (telehealth visit; coronavirus pandemic)  ASSESSMENT & PLAN:    1. Paroxysmal atrial fibrillation-based on history patient remains in sinus rhythm.  Continue Cardizem.  Note beta-blockade was discontinued previously because of fatigue.  Anticoagulation also previously discontinued following ablation. 2. Hypertension-patient complains of bilateral breast pain.  Possible relation to spironolactone.  Will discontinue.  Add losartan 25 mg daily.  Follow blood pressure at home and advance regimen as needed.  Check potassium and renal function in 1 week. 3. Hyperlipidemia-Per primary care. 4. Palpitations-no recent symptoms.  Continue Cardizem.  Beta-blocker discontinued previously because of bradycardia. 5. Chronic fatigue-follow-up primary care. 6. History of chest pain-symptoms are atypical.  Recent nuclear study shows no ischemia. 7. Low vitamin D level-noted at last office visit.  Follow-up primary care.  COVID-19 Education: The importance of social  distancing was discussed today.  Time:   Today, I have spent 17 minutes with the patient with telehealth technology discussing the above problems.     Medication Adjustments/Labs and Tests Ordered: Current medicines are reviewed at length with the patient today.  Concerns regarding medicines are outlined above.   Tests Ordered: No orders of the defined types were placed in this encounter.   Medication Changes: No orders of the defined types were placed in this encounter.   Follow Up:  Either In Person or Virtual in 1 year(s)  Signed, , MD  02/08/2020 9:04 AM    [facility]

## 2020-01-28 DIAGNOSIS — Z01812 Encounter for preprocedural laboratory examination: Secondary | ICD-10-CM | POA: Diagnosis not present

## 2020-01-31 ENCOUNTER — Telehealth (HOSPITAL_COMMUNITY): Payer: Self-pay

## 2020-01-31 NOTE — Telephone Encounter (Signed)
Spoke with the patient, detailed instructions given. He stated that he understood and would be here for his test. S.Fatima Fedie EMTP 

## 2020-02-01 ENCOUNTER — Other Ambulatory Visit: Payer: Self-pay

## 2020-02-01 ENCOUNTER — Ambulatory Visit (HOSPITAL_COMMUNITY): Payer: BC Managed Care – PPO | Attending: Cardiology

## 2020-02-01 ENCOUNTER — Ambulatory Visit (HOSPITAL_BASED_OUTPATIENT_CLINIC_OR_DEPARTMENT_OTHER): Payer: BC Managed Care – PPO

## 2020-02-01 DIAGNOSIS — I10 Essential (primary) hypertension: Secondary | ICD-10-CM | POA: Diagnosis not present

## 2020-02-01 DIAGNOSIS — R072 Precordial pain: Secondary | ICD-10-CM | POA: Insufficient documentation

## 2020-02-01 LAB — MYOCARDIAL PERFUSION IMAGING
Estimated workload: 10.4 METS
Exercise duration (min): 9 min
Exercise duration (sec): 0 s
LV dias vol: 102 mL (ref 62–150)
LV sys vol: 41 mL
MPHR: 165 {beats}/min
Peak HR: 153 {beats}/min
Percent HR: 92 %
Rest HR: 70 {beats}/min
SDS: 1
SRS: 0
SSS: 1
TID: 0.96

## 2020-02-01 MED ORDER — TECHNETIUM TC 99M TETROFOSMIN IV KIT
32.7000 | PACK | Freq: Once | INTRAVENOUS | Status: AC | PRN
  Administered 2020-02-01: 32.7 via INTRAVENOUS
  Filled 2020-02-01: qty 33

## 2020-02-01 MED ORDER — TECHNETIUM TC 99M TETROFOSMIN IV KIT
10.4000 | PACK | Freq: Once | INTRAVENOUS | Status: AC | PRN
  Administered 2020-02-01: 10.4 via INTRAVENOUS
  Filled 2020-02-01: qty 11

## 2020-02-08 ENCOUNTER — Telehealth (INDEPENDENT_AMBULATORY_CARE_PROVIDER_SITE_OTHER): Payer: BC Managed Care – PPO | Admitting: Cardiology

## 2020-02-08 ENCOUNTER — Encounter: Payer: Self-pay | Admitting: Cardiology

## 2020-02-08 VITALS — BP 133/79 | HR 65 | Ht 70.0 in | Wt 210.0 lb

## 2020-02-08 DIAGNOSIS — I10 Essential (primary) hypertension: Secondary | ICD-10-CM | POA: Diagnosis not present

## 2020-02-08 MED ORDER — LOSARTAN POTASSIUM 25 MG PO TABS: 25.0000 mg | ORAL_TABLET | Freq: Every day | ORAL | 3 refills | Status: DC

## 2020-02-08 NOTE — Patient Instructions (Signed)
Medication Instructions:  STOP SPIRONOLACTONE  START LOSARTAN 25 MG ONCE DAILY  *If you need a refill on your cardiac medications before your next appointment, please call your pharmacy*   Lab Work: Your physician recommends that you return for lab work in: ONE WEEK  If you have labs (blood work) drawn today and your tests are completely normal, you will receive your results only by: Marland Kitchen MyChart Message (if you have MyChart) OR . A paper copy in the mail If you have any lab test that is abnormal or we need to change your treatment, we will call you to review the results.   Follow-Up: At [facility], you and your health needs are our priority.  As part of our continuing mission to provide you with exceptional heart care, we have created designated Provider Care Teams.  These Care Teams include your primary Cardiologist (physician) and Advanced Practice Providers (APPs -  Physician Assistants and Nurse Practitioners) who all work together to provide you with the care you need, when you need it.  We recommend signing up for the patient portal called "MyChart".  Sign up information is provided on this After Visit Summary.  MyChart is used to connect with patients for Virtual Visits (Telemedicine).  Patients are able to view lab/test results, encounter notes, upcoming appointments, etc.  Non-urgent messages can be sent to your provider as well.   To learn more about what you can do with MyChart, go to [email or web address].    Your next appointment:   12 month(s)  The format for your next appointment:   Either In Person or Virtual  Provider:   You may see Olga Millers, MD or one of the following Advanced Practice Providers on your designated Care Team:    Corine Shelter, PA-C  [place], [place]  Edd Fabian, [place]

## 2020-02-12 DIAGNOSIS — R0789 Other chest pain: Secondary | ICD-10-CM | POA: Diagnosis not present

## 2020-02-14 ENCOUNTER — Other Ambulatory Visit: Payer: Self-pay | Admitting: *Deleted

## 2020-02-14 DIAGNOSIS — N644 Mastodynia: Secondary | ICD-10-CM

## 2020-02-16 DIAGNOSIS — Z1159 Encounter for screening for other viral diseases: Secondary | ICD-10-CM | POA: Diagnosis not present

## 2020-02-21 DIAGNOSIS — K3189 Other diseases of stomach and duodenum: Secondary | ICD-10-CM | POA: Diagnosis not present

## 2020-02-21 DIAGNOSIS — K2971 Gastritis, unspecified, with bleeding: Secondary | ICD-10-CM | POA: Diagnosis not present

## 2020-02-21 DIAGNOSIS — R1013 Epigastric pain: Secondary | ICD-10-CM | POA: Diagnosis not present

## 2020-02-21 DIAGNOSIS — K319 Disease of stomach and duodenum, unspecified: Secondary | ICD-10-CM | POA: Diagnosis not present

## 2020-02-22 DIAGNOSIS — R222 Localized swelling, mass and lump, trunk: Secondary | ICD-10-CM | POA: Diagnosis not present

## 2020-02-25 ENCOUNTER — Other Ambulatory Visit: Payer: Self-pay

## 2020-02-25 ENCOUNTER — Ambulatory Visit
Admission: RE | Admit: 2020-02-25 | Discharge: 2020-02-25 | Disposition: A | Discharge status: Home / Self Care | Payer: BC Managed Care – PPO | Source: Ambulatory Visit | Attending: *Deleted | Admitting: *Deleted

## 2020-02-25 DIAGNOSIS — N644 Mastodynia: Secondary | ICD-10-CM

## 2020-02-25 DIAGNOSIS — R928 Other abnormal and inconclusive findings on diagnostic imaging of breast: Secondary | ICD-10-CM | POA: Diagnosis not present

## 2020-02-25 DIAGNOSIS — I10 Essential (primary) hypertension: Secondary | ICD-10-CM | POA: Diagnosis not present

## 2020-02-25 DIAGNOSIS — N62 Hypertrophy of breast: Secondary | ICD-10-CM | POA: Diagnosis not present

## 2020-02-26 LAB — BASIC METABOLIC PANEL
BUN/Creatinine Ratio: 8 — ABNORMAL LOW (ref 9–20)
BUN: 9 mg/dL (ref 6–24)
CO2: 25 mmol/L (ref 20–29)
Calcium: 9.5 mg/dL (ref 8.7–10.2)
Chloride: 104 mmol/L (ref 96–106)
Creatinine, Ser: 1.08 mg/dL (ref 0.76–1.27)
GFR calc Af Amer: 89 mL/min/{1.73_m2} (ref >59)
GFR calc non Af Amer: 77 mL/min/{1.73_m2} (ref >59)
Glucose: 94 mg/dL (ref 65–99)
Potassium: 4.4 mmol/L (ref 3.5–5.2)
Sodium: 143 mmol/L (ref 134–144)

## 2020-02-29 ENCOUNTER — Other Ambulatory Visit: Payer: BC Managed Care – PPO

## 2020-03-02 DIAGNOSIS — S4292XS Fracture of left shoulder girdle, part unspecified, sequela: Secondary | ICD-10-CM | POA: Diagnosis not present

## 2020-03-02 DIAGNOSIS — M4722 Other spondylosis with radiculopathy, cervical region: Secondary | ICD-10-CM | POA: Diagnosis not present

## 2020-03-02 DIAGNOSIS — R222 Localized swelling, mass and lump, trunk: Secondary | ICD-10-CM | POA: Diagnosis not present

## 2020-03-15 DIAGNOSIS — H43392 Other vitreous opacities, left eye: Secondary | ICD-10-CM | POA: Diagnosis not present

## 2020-03-15 DIAGNOSIS — H43812 Vitreous degeneration, left eye: Secondary | ICD-10-CM | POA: Diagnosis not present

## 2020-03-15 DIAGNOSIS — H21342 Primary cyst of pars plana, left eye: Secondary | ICD-10-CM | POA: Diagnosis not present

## 2020-03-29 DIAGNOSIS — H43392 Other vitreous opacities, left eye: Secondary | ICD-10-CM | POA: Diagnosis not present

## 2020-03-29 DIAGNOSIS — H33102 Unspecified retinoschisis, left eye: Secondary | ICD-10-CM | POA: Diagnosis not present

## 2020-03-29 DIAGNOSIS — H43812 Vitreous degeneration, left eye: Secondary | ICD-10-CM | POA: Diagnosis not present

## 2020-03-29 DIAGNOSIS — H21342 Primary cyst of pars plana, left eye: Secondary | ICD-10-CM | POA: Diagnosis not present

## 2020-05-17 DIAGNOSIS — H43392 Other vitreous opacities, left eye: Secondary | ICD-10-CM | POA: Diagnosis not present

## 2020-05-17 DIAGNOSIS — H33102 Unspecified retinoschisis, left eye: Secondary | ICD-10-CM | POA: Diagnosis not present

## 2020-05-17 DIAGNOSIS — H21342 Primary cyst of pars plana, left eye: Secondary | ICD-10-CM | POA: Diagnosis not present

## 2020-05-17 DIAGNOSIS — H43812 Vitreous degeneration, left eye: Secondary | ICD-10-CM | POA: Diagnosis not present

## 2020-07-21 DIAGNOSIS — R0981 Nasal congestion: Secondary | ICD-10-CM | POA: Diagnosis not present

## 2020-07-21 DIAGNOSIS — R05 Cough: Secondary | ICD-10-CM | POA: Diagnosis not present

## 2020-08-07 DIAGNOSIS — I10 Essential (primary) hypertension: Secondary | ICD-10-CM | POA: Diagnosis not present

## 2020-08-07 DIAGNOSIS — R5383 Other fatigue: Secondary | ICD-10-CM | POA: Diagnosis not present

## 2020-08-07 DIAGNOSIS — Z91018 Allergy to other foods: Secondary | ICD-10-CM | POA: Diagnosis not present

## 2020-08-07 DIAGNOSIS — E785 Hyperlipidemia, unspecified: Secondary | ICD-10-CM | POA: Diagnosis not present

## 2020-08-10 ENCOUNTER — Telehealth (INDEPENDENT_AMBULATORY_CARE_PROVIDER_SITE_OTHER): Payer: BC Managed Care – PPO | Admitting: Cardiology

## 2020-08-10 ENCOUNTER — Encounter: Payer: Self-pay | Admitting: Cardiology

## 2020-08-10 VITALS — BP 131/84 | HR 73 | Ht 70.0 in | Wt 215.0 lb

## 2020-08-10 DIAGNOSIS — R5383 Other fatigue: Secondary | ICD-10-CM | POA: Diagnosis not present

## 2020-08-10 DIAGNOSIS — I10 Essential (primary) hypertension: Secondary | ICD-10-CM | POA: Diagnosis not present

## 2020-08-10 DIAGNOSIS — I480 Paroxysmal atrial fibrillation: Secondary | ICD-10-CM | POA: Diagnosis not present

## 2020-08-10 NOTE — Progress Notes (Signed)
Virtual Visit via Telephone Note   This visit type was conducted due to national recommendations for restrictions regarding the COVID-19 Pandemic (e.g. social distancing) in an effort to limit this patient's exposure and mitigate transmission in our community.  Due to his co-morbid illnesses, this patient is at least at moderate risk for complications without adequate follow up.  This format is felt to be most appropriate for this patient at this time.  The patient did not have access to video technology/had technical difficulties with video requiring transitioning to audio format only (telephone).  All issues noted in this document were discussed and addressed.  No physical exam could be performed with this format.  Please refer to the patient's chart for his  consent to telehealth for [facility].    Date:  08/10/2020   ID:  Kevin Holmes, DOB [birth year], MRN [id number] The patient was identified using 2 identifiers.  Patient Location: Home Provider Location: Home Office  PCP:  [name], MD  Cardiologist:  Olga Millers, MD  Electrophysiologist:  None   Evaluation Performed:  Follow-Up Visit  Chief Complaint:  Intermittent fatigue, elevated B/P in am  History of Present Illness:    Kevin Holmes is a 54 y.o. male with a history of hypertension and PAF.  He had a radiofrequency ablation in 2018.  Anticoagulation was stopped after this.  He is done well from that standpoint with only rare palpitations that are not sustained.  He had issues with fatigue on beta-blockers and is on diltiazem.  He has hypertension.  Aldactone was stopped secondary to breast discomfort.  Looking back at his blood pressures in the chart these seem well controlled.  The patient tells me in the morning she notes his blood pressures in the high 140s over 90s and he does not feel well.  Does come down after he takes his medication.  He has had issues with intermittent fatigue and weakness.  He is going  to be evaluated by a neurologist for possible multiple sclerosis.  The patient does not have symptoms concerning for COVID-19 infection (fever, chills, cough, or new shortness of breath).    Past Medical History:  Diagnosis Date  . History of gallstones    LAPAROSCOPIC CHOLECYSTECTOMY 2005  . Hypertension   . Paroxysmal atrial fibrillation (HCC)   . Pure hypercholesterolemia    Past Surgical History:  Procedure Laterality Date  . ATRIAL FIBRILLATION ABLATION N/A 10/20/2012   Procedure: ATRIAL FIBRILLATION ABLATION;  Surgeon: Hillis Range, MD;  Location: [facility] CATH LAB;  Service: Cardiovascular;  Laterality: N/A;  . LAMINECTOMY    . LAPAROSCOPIC CHOLECYSTECTOMY  07/04/2004 [facility]   DR. PAUL TOTH  . TEE WITHOUT CARDIOVERSION  10/19/2012   Procedure: TRANSESOPHAGEAL ECHOCARDIOGRAM (TEE);  Surgeon: Pricilla Riffle, MD;  Location: [facility] ENDOSCOPY;  Service: Cardiovascular;  Laterality: N/A;     Current Meds  Medication Sig  . aspirin EC 81 MG tablet Take 1 tablet (81 mg total) by mouth daily.  Marland Kitchen diltiazem (TIAZAC) 360 MG 24 hr capsule Take 1 capsule (360 mg total) by mouth daily.  Marland Kitchen losartan (COZAAR) 25 MG tablet Take 1 tablet (25 mg total) by mouth daily.  . Potassium Citrate 15 MEQ (1620 MG) TBCR Take 4 tablets by mouth daily.  . rosuvastatin (CRESTOR) 20 MG tablet Take 20 mg by mouth at bedtime.  . [DISCONTINUED] Coenzyme Q10 (COQ10 PO) Take 300 mg by mouth.  . [DISCONTINUED] omeprazole (PRILOSEC) 40 MG capsule Take 40 mg by  mouth daily.     Allergies:   Other   Social History   Tobacco Use  . Smoking status: Never Smoker  . Smokeless tobacco: Never Used  Substance Use Topics  . Alcohol use: Yes    Alcohol/week: 0.0 standard drinks    Comment: rare  . Drug use: No     Family Hx: The patient's family history includes Breast cancer (age of onset: 30) in his paternal grandmother; Heart attack (age of onset: 42) in his paternal grandfather; Hypertension in his father.  ROS:     Please see the history of present illness.    All other systems reviewed and are negative.   Prior CV studies:   The following studies were reviewed today: Myoview 02/01/2020-  Nuclear stress EF: 60%. No wall motion abnormalities  There was no ST segment deviation noted during stress.  The study is normal.  This is a low risk study. No evidence of ischemia or infarction.  Echo 02/01/2020- IMPRESSIONS    1. Left ventricular ejection fraction, by estimation, is 55 to 60%. Left  ventricular ejection fraction by 3D volume is 56 %. The left ventricle has  normal function. The left ventricle has no regional wall motion  abnormalities. There is mild left  ventricular hypertrophy. Left ventricular diastolic parameters were  normal. The average left ventricular global longitudinal strain is -19.0  %.  2. Right ventricular systolic function is normal. The right ventricular  size is normal. Tricuspid regurgitation signal is inadequate for assessing  PA pressure.  3. The mitral valve is normal in structure. No evidence of mitral valve  regurgitation.  4. The aortic valve is tricuspid. Aortic valve regurgitation is not  visualized. No aortic stenosis is present.  5. Aortic dilatation noted. There is mild dilatation of the ascending  aorta measuring 38 mm.  6. The inferior vena cava is normal in size with greater than 50%  respiratory variability, suggesting right atrial pressure of 3 mmHg.   Comparison(s): 08/03/12 EF 60-65%.    Labs/Other Tests and Data Reviewed:    EKG:  An ECG dated 01/15/2020 was personally reviewed today and demonstrated:  NSR- HR 62  Recent Labs: 01/12/2020: TSH 1.690 02/25/2020: BUN 9; Creatinine, Ser 1.08; Potassium 4.4; Sodium 143   Recent Lipid Panel Lab Results  Component Value Date/Time   CHOL 162 04/23/2019 10:05 AM   TRIG 126 04/23/2019 10:05 AM   HDL 44 04/23/2019 10:05 AM   CHOLHDL 3.7 04/23/2019 10:05 AM   CHOLHDL 4 09/16/2014 10:03 AM    LDLCALC 93 04/23/2019 10:05 AM    Wt Readings from Last 3 Encounters:  08/10/20 215 lb (97.5 kg)  02/08/20 210 lb (95.3 kg)  02/01/20 212 lb (96.2 kg)     Objective:    Vital Signs:  BP 131/84   Pulse 73   Ht 5\' 10"  (1.778 m)   Wt 215 lb (97.5 kg)   BMI 30.85 kg/m    VITAL SIGNS:  reviewed  ASSESSMENT & PLAN:    HTN- Change Losartan to Q HS  PAF- RFA 2018- AC stopped after that.  Fatigue- MS work up pending.  Plan: Change Losartan to Q HS  COVID-19 Education: The signs and symptoms of COVID-19 were discussed with the patient and how to seek care for testing (follow up with PCP or arrange E-visit).  The importance of social distancing was discussed today.  Time:   Today, I have spent 15 minutes with the patient with telehealth technology discussing the  above problems.     Medication Adjustments/Labs and Tests Ordered: Current medicines are reviewed at length with the patient today.  Concerns regarding medicines are outlined above.   Tests Ordered: No orders of the defined types were placed in this encounter.   Medication Changes: No orders of the defined types were placed in this encounter.   Follow Up:  In Person Dr Jens Som in 6 months  Signed, Corine Shelter, PA-C  08/10/2020 3:10 PM    [facility]

## 2020-08-10 NOTE — Patient Instructions (Signed)
Medication Instructions:  TAKE- Losartan at bedtime  *If you need a refill on your cardiac medications before your next appointment, please call your pharmacy*   Lab Work: None Ordered   Testing/Procedures: None Ordered   Follow-Up: At [facility], you and your health needs are our priority.  As part of our continuing mission to provide you with exceptional heart care, we have created designated Provider Care Teams.  These Care Teams include your primary Cardiologist (physician) and Advanced Practice Providers (APPs -  Physician Assistants and Nurse Practitioners) who all work together to provide you with the care you need, when you need it.  We recommend signing up for the patient portal called "MyChart".  Sign up information is provided on this After Visit Summary.  MyChart is used to connect with patients for Virtual Visits (Telemedicine).  Patients are able to view lab/test results, encounter notes, upcoming appointments, etc.  Non-urgent messages can be sent to your provider as well.   To learn more about what you can do with MyChart, go to [email or web address].    Your next appointment:   6 month(s)  The format for your next appointment:   In Person  Provider:   You may see Olga Millers, MD or one of the following Advanced Practice Providers on your designated Care Team:    Corine Shelter, PA-C  [place], [place]  Edd Fabian, [place]

## 2020-08-11 DIAGNOSIS — I10 Essential (primary) hypertension: Secondary | ICD-10-CM | POA: Diagnosis not present

## 2020-08-11 DIAGNOSIS — E785 Hyperlipidemia, unspecified: Secondary | ICD-10-CM | POA: Diagnosis not present

## 2020-08-11 DIAGNOSIS — R202 Paresthesia of skin: Secondary | ICD-10-CM | POA: Diagnosis not present

## 2020-08-23 ENCOUNTER — Telehealth: Payer: Self-pay | Admitting: Cardiology

## 2020-08-23 DIAGNOSIS — I10 Essential (primary) hypertension: Secondary | ICD-10-CM

## 2020-08-23 MED ORDER — LOSARTAN POTASSIUM 50 MG PO TABS: 50.0000 mg | ORAL_TABLET | Freq: Every day | ORAL | 3 refills | Status: DC

## 2020-08-23 NOTE — Telephone Encounter (Signed)
Increase losartan to 50 mg daily and follow blood pressure.  If still not controlled will increase to 100 mg daily in the future.  Check potassium and renal function in 2 weeks. Olga Millers

## 2020-08-23 NOTE — Telephone Encounter (Signed)
Spoke with pt, Aware of dr crenshaw's recommendations. New script sent to the pharmacy and Lab orders mailed to the pt  

## 2020-08-23 NOTE — Telephone Encounter (Signed)
Pt c/o medication issue:  1. Name of Medication: losartan (COZAAR) 25 MG tablet  2. How are you currently taking this medication (dosage and times per day)? Once daily at night   3. Are you having a reaction (difficulty breathing--STAT)? no  4. What is your medication issue? Pt wanted to know if there was any other adjustments that needed made to his medicine   Pt c/o BP issue: STAT if pt c/o blurred vision, one-sided weakness or slurred speech  1. What are your last 5 BP readings?  08/22/20 am: 156/101 08/22/20 pm: 153/100  08/23/20: 145/99   2. Are you having any other symptoms (ex. Dizziness, headache, blurred vision, passed out)? Intermittent headaches , just hasn't felt good overall   3. What is your BP issue? Patient said he wakes up and his BP is higher than normal.  His BP is the best around 10 or 11 am.  He had a video visit with Franky Macho 08/10/20 and Franky Macho reommended he take the losartan at night and his other medicine in the morning

## 2020-08-23 NOTE — Telephone Encounter (Signed)
Spoke with pt and B/P remains elevated even after taking Losartan 25 mg in the pm Per pt had been on Lisinopril and was changed to Losartan about 6 months ago Per pt feels bad when B/P is elevated Will forward to Dr Jens Som for review .Zack Seal

## 2020-08-30 ENCOUNTER — Encounter: Payer: Self-pay | Admitting: Neurology

## 2020-08-30 ENCOUNTER — Ambulatory Visit: Payer: BC Managed Care – PPO | Admitting: Neurology

## 2020-08-30 VITALS — BP 142/80 | HR 85 | Ht 70.0 in | Wt 218.5 lb

## 2020-08-30 DIAGNOSIS — R200 Anesthesia of skin: Secondary | ICD-10-CM | POA: Diagnosis not present

## 2020-08-30 DIAGNOSIS — R0683 Snoring: Secondary | ICD-10-CM

## 2020-08-30 DIAGNOSIS — G4719 Other hypersomnia: Secondary | ICD-10-CM

## 2020-08-30 DIAGNOSIS — Z820 Family history of epilepsy and other diseases of the nervous system: Secondary | ICD-10-CM | POA: Diagnosis not present

## 2020-08-30 DIAGNOSIS — G4733 Obstructive sleep apnea (adult) (pediatric): Secondary | ICD-10-CM

## 2020-08-30 DIAGNOSIS — M79602 Pain in left arm: Secondary | ICD-10-CM

## 2020-08-30 DIAGNOSIS — R5383 Other fatigue: Secondary | ICD-10-CM
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
## 2020-08-31 ENCOUNTER — Telehealth: Payer: Self-pay | Admitting: Neurology

## 2020-08-31 NOTE — Telephone Encounter (Signed)
BCBS YOVZ:[id number] (exp. 08/31/20 to 09/29/20) order sent to [facility] because the cost was cheaper at [facility]. They will reach out to the patient to schedule.

## 2020-09-04 ENCOUNTER — Ambulatory Visit
Admission: RE | Admit: 2020-09-04 | Discharge: 2020-09-04 | Disposition: A | Discharge status: Home / Self Care | Payer: BC Managed Care – PPO | Source: Ambulatory Visit | Attending: Neurology | Admitting: Neurology

## 2020-09-04 ENCOUNTER — Other Ambulatory Visit: Payer: Self-pay

## 2020-09-04 DIAGNOSIS — R200 Anesthesia of skin: Secondary | ICD-10-CM | POA: Diagnosis not present

## 2020-09-04 DIAGNOSIS — Z820 Family history of epilepsy and other diseases of the nervous system: Secondary | ICD-10-CM

## 2020-09-04 DIAGNOSIS — R5383 Other fatigue: Secondary | ICD-10-CM

## 2020-09-04 DIAGNOSIS — M79602 Pain in left arm: Secondary | ICD-10-CM

## 2020-09-07 ENCOUNTER — Telehealth: Payer: Self-pay

## 2020-09-07 NOTE — Telephone Encounter (Signed)
Pt asked about MRI results while being scheduled for Home Sleep Study, anxious about results.

## 2020-09-12 ENCOUNTER — Telehealth: Payer: Self-pay | Admitting: Neurology

## 2020-09-12 NOTE — Telephone Encounter (Signed)
Pt. Called to find out status of MRI or if the results have come back. Pt. Is requesting a call.

## 2020-09-12 NOTE — Telephone Encounter (Signed)
Please let him know that the MRI of the brain was normal for age  The MRI of the cervical spine showed a normal spinal cord.  He does have some degenerative changes with discs protrusions and bone spurs.  There could be compression of the C4 and C6 nerve roots on the left which could explain left shoulder and arm pain and numbness

## 2020-09-13 NOTE — Telephone Encounter (Signed)
Called and spoke with pt about results per Dr. Epimenio Foot note. He requested I send him a mychart message about results as well for him to reference. He is going to think about referral to neurosurgery and will call back if he would like to proceed with this.

## 2020-09-20 ENCOUNTER — Ambulatory Visit: Payer: BC Managed Care – PPO | Admitting: Neurology

## 2020-09-25 ENCOUNTER — Ambulatory Visit (INDEPENDENT_AMBULATORY_CARE_PROVIDER_SITE_OTHER): Payer: BC Managed Care – PPO | Admitting: Neurology

## 2020-09-25 DIAGNOSIS — G4733 Obstructive sleep apnea (adult) (pediatric): Secondary | ICD-10-CM | POA: Diagnosis not present

## 2020-09-25 DIAGNOSIS — G4719 Other hypersomnia: Secondary | ICD-10-CM

## 2020-09-25 DIAGNOSIS — R0683 Snoring: Secondary | ICD-10-CM

## 2020-09-26 NOTE — Progress Notes (Signed)
   [facility]  HOME SLEEP TEST (Watch PAT)  STUDY DATE: 09-25-2020  DOB: February 22, 1966  MRN: [id number]  ORDERING CLINICIAN: Richard A. Epimenio Foot, MD, PhD, FAAN   REFERRING CLINICIAN: [name], MD ; Pearletha Furl. Epimenio Foot, MD, PhD, Larene [place]  CLINICAL INFORMATION/HISTORY: Kevin Holmes is a 54 y.o. man with snoring and excessive daytime sleepiness  Epworth sleepiness score: 18/24.  BMI: 31.2 kg/m  FINDINGS:   Total Record Time (hours, min): 7 H 28 min  Total Sleep Time (hours, min):  6 H 35 min   Percent REM (%):    24.18 %   Calculated pAHI (per hour):   19.5      REM pAHI:    32.7     NREM pAHI: 15.3   Oxygen Saturation (%) Mean: 95  Minimum oxygen saturation (%):                 83   O2 Saturation Range (%): 99-83  O2Saturation (minutes) <=88%: 3.0 min   Pulse Mean (bpm):    63  Pulse Range (87-44)   IMPRESSION: Moderate OSA (obstructive sleep apnea) with AHI= 19.5   RECOMMENDATION:  1.   Recommend AutoPap with pressure range 5-20 cm H2O with a heated humidifier.  Check download in 30 and 90 days. 2.   Follow-up with Dr. Epimenio Foot   INTERPRETING PHYSICIAN:   Pearletha Furl. Epimenio Foot, MD, [facility] 08/30/2020, 5:20 PM Certified in Neurology, Clinical Neurophysiology, Sleep Medicine and Neuroimaging  [facility] [address], [address] [place], [place] [address] [phone]

## 2020-10-02 ENCOUNTER — Telehealth: Payer: Self-pay | Admitting: *Deleted

## 2020-10-02 ENCOUNTER — Encounter: Payer: Self-pay | Admitting: *Deleted

## 2020-10-02 DIAGNOSIS — G4733 Obstructive sleep apnea (adult) (pediatric): Secondary | ICD-10-CM

## 2020-10-02 NOTE — Telephone Encounter (Signed)
Called and spoke with patient about results per Dr. Epimenio Foot note. He had no DME preference. Advised we will send orders to [facility]. They should contact him in the next 1-2 weeks to get set up. They will have to get insurance auth first. He is aware I will be sending him a letter detailing all of this. He is to call our office the day they call to get him set up with machine so we can set up 31-90 day f/u at our office for insurances purposes. He verbalized understanding.   I spoke with Dr. Epimenio Foot who confirmed that he would like him to get monoclonal antibody infusion. I called pt back and informed his of this. He verbalized understanding.

## 2020-10-02 NOTE — Addendum Note (Signed)
Addended by: Arther Abbott on: 10/02/2020 12:03 PM   Modules accepted: Orders

## 2020-10-02 NOTE — Telephone Encounter (Signed)
-----   Message from Asa Lente, MD sent at 09/29/2020  2:01 PM EST ----- Regarding: Home sleep study He has moderate sleep apnea.  I would like him to start AutoPap 5-20 cm

## 2020-10-11 DIAGNOSIS — Z23 Encounter for immunization: Secondary | ICD-10-CM | POA: Diagnosis not present

## 2020-10-15 ENCOUNTER — Other Ambulatory Visit: Payer: Self-pay | Admitting: Cardiology

## 2020-10-15 DIAGNOSIS — I4891 Unspecified atrial fibrillation: Secondary | ICD-10-CM

## 2020-11-17 DIAGNOSIS — G4733 Obstructive sleep apnea (adult) (pediatric): Secondary | ICD-10-CM | POA: Diagnosis not present

## 2020-11-22 ENCOUNTER — Telehealth: Payer: Self-pay | Admitting: Neurology

## 2020-11-22 NOTE — Telephone Encounter (Signed)
Called pt back. Scheduled initial CPAP follow up for 01/22/21 at 1:30pm with Dr. Epimenio Foot. Asked he bring machine to this appt with him. He verbalized understanding and appreciation.

## 2020-11-22 NOTE — Telephone Encounter (Signed)
Pt. states he recently got his CPAP machine & needs to have his initial appt. within 90 days for his insurance to cover. The next available appt. Is in May. Please advise pt. as to what to do.

## 2020-12-14 DIAGNOSIS — G4733 Obstructive sleep apnea (adult) (pediatric): Secondary | ICD-10-CM | POA: Diagnosis not present

## 2020-12-18 ENCOUNTER — Telehealth: Payer: Self-pay | Admitting: Neurology

## 2020-12-18 DIAGNOSIS — G4733 Obstructive sleep apnea (adult) (pediatric): Secondary | ICD-10-CM | POA: Diagnosis not present

## 2020-12-18 DIAGNOSIS — J020 Streptococcal pharyngitis: Secondary | ICD-10-CM | POA: Diagnosis not present

## 2020-12-18 DIAGNOSIS — J029 Acute pharyngitis, unspecified: Secondary | ICD-10-CM | POA: Diagnosis not present

## 2020-12-18 NOTE — Telephone Encounter (Signed)
Stanton Kidney, I tried releasing the results but Epic wont let me for some reason. Can you get him a copy of his last MRI results? Thank you!

## 2020-12-18 NOTE — Telephone Encounter (Signed)
Pt called, I know we discussed the MRI results, but I can not see my MRI results from last MRI. Would like a call from the nurse.

## 2020-12-19 DIAGNOSIS — G4733 Obstructive sleep apnea (adult) (pediatric): Secondary | ICD-10-CM | POA: Diagnosis not present

## 2020-12-19 NOTE — Telephone Encounter (Signed)
Done

## 2020-12-29 DIAGNOSIS — G4733 Obstructive sleep apnea (adult) (pediatric): Secondary | ICD-10-CM | POA: Diagnosis not present

## 2020-12-29 DIAGNOSIS — I10 Essential (primary) hypertension: Secondary | ICD-10-CM | POA: Diagnosis not present

## 2021-01-15 DIAGNOSIS — G4733 Obstructive sleep apnea (adult) (pediatric): Secondary | ICD-10-CM | POA: Diagnosis not present

## 2021-01-22 ENCOUNTER — Encounter: Payer: Self-pay | Admitting: Neurology

## 2021-01-22 ENCOUNTER — Ambulatory Visit: Payer: BC Managed Care – PPO | Admitting: Neurology

## 2021-01-22 ENCOUNTER — Telehealth: Payer: Self-pay | Admitting: *Deleted

## 2021-01-22 VITALS — BP 154/95 | HR 65 | Ht 70.0 in | Wt 220.5 lb

## 2021-01-22 DIAGNOSIS — M47812 Spondylosis without myelopathy or radiculopathy, cervical region: Secondary | ICD-10-CM

## 2021-01-22 DIAGNOSIS — G4733 Obstructive sleep apnea (adult) (pediatric): Secondary | ICD-10-CM

## 2021-01-22 DIAGNOSIS — M5020 Other cervical disc displacement, unspecified cervical region: Secondary | ICD-10-CM

## 2021-01-22 DIAGNOSIS — R200 Anesthesia of skin: Secondary | ICD-10-CM

## 2021-01-22 DIAGNOSIS — M79602 Pain in left arm: Secondary | ICD-10-CM

## 2021-01-22 NOTE — Telephone Encounter (Signed)
Pt asked how often he should scan DQ code to download date from machine to app while at appt today. I called him back to let him know he should do this at least once a week. He verbalized understanding.

## 2021-01-22 NOTE — Progress Notes (Signed)
[facility]  PATIENT: Kevin Holmes DOB: [birth year]  REFERRING DOCTOR OR PCP: Leodis Sias, MD SOURCE: Patient, notes from primary care, lab results  _________________________________   HISTORICAL  CHIEF COMPLAINT:  Chief Complaint  Patient presents with  . Follow-up    RM 13. Last seen 08/30/20. Here for initial cpap follow. Has new Luna machine.     HISTORY OF PRESENT ILLNESS:   [name] is a 55 y.o. man with numbness and OSA  Update 01/22/2021: He has neck pain and he notes some numbness in the left shoulder.  Pain rarely goes into the left shoulder and arm now.    He had an HST showing moderate OSA.     Initially, he had trouble tolerating CPAP but after switching to a nasal mask.   Since starting CPAP he notes improved BP and less somnolence.    He is sleeping better.    Spouse notes no snoring.      EPWORTH SLEEPINESS SCALE  On a scale of 0 - 3 what is the chance of dozing:  Sitting and Reading:   0 Watching TV:    1 Sitting inactive in a public place: 0 Passenger in car for one hour: 0 Lying down to rest in the afternoon: 3 Sitting and talking to someone: 0 Sitting quietly after lunch:  1 In a car, stopped in traffic:  0  Total (out of 24):5/24   no EDS   DATA  Home Sleep study 09/25/2020 showed moderate OSA and Auto-PAP 5-20 was ordered.    He is compliant (nightly use > 76% was > 4 hours) and has great efficacy (AHI = 0.7).   Average pressure (P95) was 8.5 cm H2O.     MRI cervical spine 09/12/2020 showed a normal spinal cord.  He does have some degenerative changes with discs protrusions and bone spurs/facet hypertrophy.  There could be compression of the C4 and C6 nerve roots on the left which could explain left shoulder and arm pain and numbness  MRI brain 09/12/2020 was normal.     REVIEW OF SYSTEMS: Constitutional: No fevers, chills, sweats, or change in appetite Eyes: No visual changes, double vision, eye pain Ear, nose  and throat: No hearing loss, ear pain, nasal congestion, sore throat Cardiovascular: No chest pain, palpitations Respiratory: No shortness of breath at rest or with exertion.   No wheezes.  He has snoring at night.  OSA signs. GastrointestinaI: No nausea, vomiting, diarrhea, abdominal pain, fecal incontinence Genitourinary: No dysuria, urinary retention or frequency.  No nocturia. Musculoskeletal: No neck pain, back pain Integumentary: No rash, pruritus, skin lesions Neurological: as above Psychiatric: No depression at this time.  No anxiety Endocrine: No palpitations, diaphoresis, change in appetite, change in weigh or increased thirst Hematologic/Lymphatic: No anemia, purpura, petechiae. Allergic/Immunologic: No itchy/runny eyes, nasal congestion, recent allergic reactions, rashes  ALLERGIES: Allergies  Allergen Reactions  . Other Other (See Comments)    Meat allergy    HOME MEDICATIONS:  Current Outpatient Medications:  .  aspirin EC 81 MG tablet, Take 1 tablet (81 mg total) by mouth daily., Disp: 90 tablet, Rfl: 3 .  Cyanocobalamin (B-12 PO), Take 1,000 mcg by mouth daily., Disp: , Rfl:  .  diltiazem (TIAZAC) 360 MG 24 hr capsule, TAKE 1 CAPSULE DAILY, Disp: 90 capsule, Rfl: 3 .  losartan (COZAAR) 50 MG tablet, Take 1 tablet (50 mg total) by mouth at bedtime., Disp: 90 tablet, Rfl: 3 .  Potassium Citrate 15 MEQ (1620  MG) TBCR, Take 4 tablets by mouth daily., Disp: , Rfl:  .  rosuvastatin (CRESTOR) 20 MG tablet, Take 20 mg by mouth at bedtime., Disp: , Rfl:  .  VITAMIN D PO, Take 2 tablets by mouth daily., Disp: , Rfl:   PAST MEDICAL HISTORY: Past Medical History:  Diagnosis Date  . History of gallstones    LAPAROSCOPIC CHOLECYSTECTOMY 2005  . Hypertension   . Paroxysmal atrial fibrillation (HCC)   . Pure hypercholesterolemia     PAST SURGICAL HISTORY: Past Surgical History:  Procedure Laterality Date  . ATRIAL FIBRILLATION ABLATION N/A 10/20/2012   Procedure:  ATRIAL FIBRILLATION ABLATION;  Surgeon: Hillis Range, MD;  Location: [facility] CATH LAB;  Service: Cardiovascular;  Laterality: N/A;  . LAMINECTOMY    . LAPAROSCOPIC CHOLECYSTECTOMY  07/04/2004 [facility]   DR. PAUL TOTH  . TEE WITHOUT CARDIOVERSION  10/19/2012   Procedure: TRANSESOPHAGEAL ECHOCARDIOGRAM (TEE);  Surgeon: Pricilla Riffle, MD;  Location: [facility] ENDOSCOPY;  Service: Cardiovascular;  Laterality: N/A;    FAMILY HISTORY: Family History  Problem Relation Age of Onset  . Hypertension Father   . Heart attack Paternal Grandfather 5  . Breast cancer Paternal Grandmother 30    SOCIAL HISTORY:  Social History   Socioeconomic History  . Marital status: Married    Spouse name: Not on file  . Number of children: Not on file  . Years of education: Not on file  . Highest education level: Not on file  Occupational History  . Occupation: [employer]  Tobacco Use  . Smoking status: Never Smoker  . Smokeless tobacco: Never Used  Substance and Sexual Activity  . Alcohol use: Yes    Alcohol/week: 0.0 standard drinks    Comment: rare  . Drug use: No  . Sexual activity: Not on file  Other Topics Concern  . Not on file  Social History Narrative   Lives in [place].  Works at [employer] as an Art gallery manager   Caffeine use: 1 per day   Right handed    Social Determinants of Corporate investment banker Strain: Not on file  Food Insecurity: Not on file  Transportation Needs: Not on file  Physical Activity: Not on file  Stress: Not on file  Social Connections: Not on file  Intimate Partner Violence: Not on file     PHYSICAL EXAM  Vitals:   01/22/21 1312  BP: (!) 154/95  Pulse: 65  Weight: 220 lb 8 oz (100 kg)  Height: 5\' 10"  (1.778 m)    Body mass index is 31.64 kg/m.   General: The patient is well-developed and well-nourished and in no acute distress.  The pharynx is Mallampati 3. \ Neck: Good range of motion the neck is nontender.   Skin: Extremities are without rash or  edema.  Neurologic  Exam  Mental status: The patient is alert and oriented x 3 at the time of the examination. The patient has apparent normal recent and remote memory, with an apparently normal attention span and concentration ability.   Speech is normal.  Cranial nerves: Extraocular movements are full.  Facial strength and sensation was normal.  Palatal elevation and tongue protrusion was midline.  Motor:  Muscle bulk is normal.   Tone is normal. Strength is  5 / 5 in all 4 extremities.   Gait and station: Station is normal.   Gait is normal. Tandem gait is minimally wide..   Reflexes: Deep tendon reflexes are symmetric and normal bilaterally.  DIAGNOSTIC DATA (LABS, IMAGING, TESTING) - I reviewed patient records, labs, notes, testing and imaging myself where available.  Lab Results  Component Value Date   WBC 4.5 04/15/2019   HGB 14.4 04/15/2019   HCT 42.5 04/15/2019   MCV 89.5 04/15/2019   PLT 172 04/15/2019      Component Value Date/Time   NA 143 02/25/2020 0942   K 4.4 02/25/2020 0942   CL 104 02/25/2020 0942   CO2 25 02/25/2020 0942   GLUCOSE 94 02/25/2020 0942   GLUCOSE 96 04/15/2019 1141   BUN 9 02/25/2020 0942   CREATININE 1.08 02/25/2020 0942   CALCIUM 9.5 02/25/2020 0942   PROT 7.3 04/23/2019 1005   ALBUMIN 4.7 04/23/2019 1005   AST 20 04/23/2019 1005   ALT 22 04/23/2019 1005   ALKPHOS 120 (H) 04/23/2019 1005   BILITOT 0.8 04/23/2019 1005   GFRNONAA 77 02/25/2020 0942   GFRAA 89 02/25/2020 0942   Lab Results  Component Value Date   CHOL 162 04/23/2019   HDL 44 04/23/2019   LDLCALC 93 04/23/2019   TRIG 126 04/23/2019   CHOLHDL 3.7 04/23/2019    Lab Results  Component Value Date   TSH 1.690 01/12/2020       ASSESSMENT AND PLAN  No diagnosis found.  1.  Continue AutoPap 5-20 cm.  Supplies as needed for OSA 2.   Though he continues to have some neck pain, symptoms are improved and he no longer has pain or numbness into the left shoulder.  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  He also has facet hypertrophy at several levels of the neck likely contributing to his neck pain. 3.   Return in 1 year or sooner if there are new or worsening neurologic symptoms.     Richard A. Epimenio Foot, MD, [facility] 01/22/2021, 1:41 PM Certified in Neurology, Clinical Neurophysiology, Sleep Medicine and Neuroimaging  [facility] [address] [place], [place] [address] [phone]

## 2021-01-25 DIAGNOSIS — J029 Acute pharyngitis, unspecified: Secondary | ICD-10-CM | POA: Diagnosis not present

## 2021-02-04 DIAGNOSIS — J029 Acute pharyngitis, unspecified: Secondary | ICD-10-CM | POA: Diagnosis not present

## 2021-02-04 DIAGNOSIS — R0981 Nasal congestion: Secondary | ICD-10-CM | POA: Diagnosis not present

## 2021-02-04 DIAGNOSIS — R519 Headache, unspecified: Secondary | ICD-10-CM | POA: Diagnosis not present

## 2021-02-04 DIAGNOSIS — R059 Cough, unspecified: Secondary | ICD-10-CM | POA: Diagnosis not present

## 2021-02-15 DIAGNOSIS — G4733 Obstructive sleep apnea (adult) (pediatric): Secondary | ICD-10-CM | POA: Diagnosis not present

## 2021-03-28 DIAGNOSIS — R7309 Other abnormal glucose: Secondary | ICD-10-CM | POA: Diagnosis not present

## 2021-03-28 DIAGNOSIS — I10 Essential (primary) hypertension: Secondary | ICD-10-CM | POA: Diagnosis not present

## 2021-05-01 DIAGNOSIS — J020 Streptococcal pharyngitis: Secondary | ICD-10-CM | POA: Diagnosis not present

## 2021-05-01 DIAGNOSIS — R0981 Nasal congestion: Secondary | ICD-10-CM | POA: Diagnosis not present

## 2021-05-03 DIAGNOSIS — U071 COVID-19: Secondary | ICD-10-CM | POA: Diagnosis not present

## 2021-05-03 DIAGNOSIS — R059 Cough, unspecified: Secondary | ICD-10-CM | POA: Diagnosis not present

## 2021-06-13 ENCOUNTER — Telehealth: Payer: Self-pay | Admitting: Cardiology

## 2021-06-13 NOTE — Telephone Encounter (Signed)
Patient c/o Palpitations:  High priority if patient c/o lightheadedness, shortness of breath, or chest pain  How long have you had palpitations/irregular HR/ Afib? Are you having the symptoms now? The past couple days he has had a fluttering sensation   Are you currently experiencing lightheadedness, SOB or CP? no  Do you have a history of afib (atrial fibrillation) or irregular heart rhythm? Yes. Patient had an ablation in 2013 to correct arrhythmia   Have you checked your BP or HR? (document readings if available):  06/12/21: 131/84 HR 62 Patient states his BP and HR were "in that same range" a few days ago Are you experiencing any other symptoms? Fatigue,  lack of energy, Swelling in both ankles  Pt c/o swelling: STAT is pt has developed SOB within 24 hours  If swelling, where is the swelling located? ankles  How much weight have you gained and in what time span? Pt does not weigh himself to track the swelling   Have you gained 3 pounds in a day or 5 pounds in a week?   Do you have a log of your daily weights (if so, list)?   Are you currently taking a fluid pill? Yes, but on an as needed basis  Are you currently SOB? no  Have you traveled recently? No  Patient wanted to come in and be seen

## 2021-06-13 NOTE — Telephone Encounter (Signed)
Left message for pt to call.

## 2021-06-14 DIAGNOSIS — I10 Essential (primary) hypertension: Secondary | ICD-10-CM | POA: Diagnosis not present

## 2021-06-14 DIAGNOSIS — E559 Vitamin D deficiency, unspecified: Secondary | ICD-10-CM | POA: Diagnosis not present

## 2021-06-14 NOTE — Telephone Encounter (Signed)
Spoke with pt, about 1 month ago he started developing swelling in his feet and ankles and his medical doctor gave him a fluid pill to take as needed. He reports it worked but now with any salt or soda he will swell. He also reports for the last few days he has had a discomfort in his right chest into the center. It can occur at any time and there is nothing that makes it worse or better. He has also noticed an increase in the fluttering in his chest that  usually corresponds with the discomfort. He has had an ablation in the past. He can get this discomfort 4 to 5 times daily. He also reports in general he feels bad and has lost his energy. He would like to be seen. Follow up scheduled

## 2021-06-15 DIAGNOSIS — R079 Chest pain, unspecified: Secondary | ICD-10-CM | POA: Diagnosis not present

## 2021-06-16 ENCOUNTER — Emergency Department (HOSPITAL_BASED_OUTPATIENT_CLINIC_OR_DEPARTMENT_OTHER): Payer: BC Managed Care – PPO

## 2021-06-16 ENCOUNTER — Other Ambulatory Visit: Payer: Self-pay

## 2021-06-16 ENCOUNTER — Emergency Department (HOSPITAL_BASED_OUTPATIENT_CLINIC_OR_DEPARTMENT_OTHER)
Admission: EM | Admit: 2021-06-16 | Discharge: 2021-06-16 | Disposition: A | Discharge status: Home / Self Care | Payer: BC Managed Care – PPO | Attending: Emergency Medicine | Admitting: Emergency Medicine

## 2021-06-16 ENCOUNTER — Encounter (HOSPITAL_BASED_OUTPATIENT_CLINIC_OR_DEPARTMENT_OTHER): Payer: Self-pay | Admitting: *Deleted

## 2021-06-16 DIAGNOSIS — I10 Essential (primary) hypertension: Secondary | ICD-10-CM | POA: Diagnosis not present

## 2021-06-16 DIAGNOSIS — M7989 Other specified soft tissue disorders: Secondary | ICD-10-CM | POA: Diagnosis not present

## 2021-06-16 DIAGNOSIS — R0789 Other chest pain: Secondary | ICD-10-CM | POA: Insufficient documentation

## 2021-06-16 DIAGNOSIS — R5383 Other fatigue: Secondary | ICD-10-CM | POA: Diagnosis not present

## 2021-06-16 DIAGNOSIS — R7989 Other specified abnormal findings of blood chemistry: Secondary | ICD-10-CM | POA: Diagnosis not present

## 2021-06-16 DIAGNOSIS — Z79899 Other long term (current) drug therapy: Secondary | ICD-10-CM | POA: Diagnosis not present

## 2021-06-16 DIAGNOSIS — R2240 Localized swelling, mass and lump, unspecified lower limb: Secondary | ICD-10-CM | POA: Insufficient documentation

## 2021-06-16 DIAGNOSIS — Z7982 Long term (current) use of aspirin: Secondary | ICD-10-CM | POA: Insufficient documentation

## 2021-06-16 DIAGNOSIS — J9811 Atelectasis: Secondary | ICD-10-CM | POA: Diagnosis not present

## 2021-06-16 DIAGNOSIS — R002 Palpitations: Secondary | ICD-10-CM | POA: Diagnosis not present

## 2021-06-16 LAB — BASIC METABOLIC PANEL
Anion gap: 9 (ref 5–15)
BUN: 10 mg/dL (ref 6–20)
CO2: 29 mmol/L (ref 22–32)
Calcium: 9.1 mg/dL (ref 8.9–10.3)
Chloride: 104 mmol/L (ref 98–111)
Creatinine, Ser: 0.97 mg/dL (ref 0.61–1.24)
GFR, Estimated: >60 mL/min (ref >60)
Glucose, Bld: 99 mg/dL (ref 70–99)
Potassium: 3.2 mmol/L — ABNORMAL LOW (ref 3.5–5.1)
Sodium: 142 mmol/L (ref 135–145)

## 2021-06-16 LAB — CBC WITH DIFFERENTIAL/PLATELET
Abs Immature Granulocytes: 0.02 10*3/uL (ref 0.00–0.07)
Basophils Absolute: 0.1 10*3/uL (ref 0.0–0.1)
Basophils Relative: 2 %
Eosinophils Absolute: 0.1 10*3/uL (ref 0.0–0.5)
Eosinophils Relative: 2 %
HCT: 42.6 % (ref 39.0–52.0)
Hemoglobin: 14.9 g/dL (ref 13.0–17.0)
Immature Granulocytes: 0 %
Lymphocytes Relative: 24 %
Lymphs Abs: 1.2 10*3/uL (ref 0.7–4.0)
MCH: 30 pg (ref 26.0–34.0)
MCHC: 35 g/dL (ref 30.0–36.0)
MCV: 85.9 fL (ref 80.0–100.0)
Monocytes Absolute: 0.5 10*3/uL (ref 0.1–1.0)
Monocytes Relative: 10 %
Neutro Abs: 3 10*3/uL (ref 1.7–7.7)
Neutrophils Relative %: 62 %
Platelets: 232 10*3/uL (ref 150–400)
RBC: 4.96 MIL/uL (ref 4.22–5.81)
RDW: 12.4 % (ref 11.5–15.5)
WBC: 4.8 10*3/uL (ref 4.0–10.5)
nRBC: 0 % (ref 0.0–0.2)

## 2021-06-16 LAB — TROPONIN I (HIGH SENSITIVITY): Troponin I (High Sensitivity): 3 ng/L (ref <18)

## 2021-06-16 MED ORDER — IOHEXOL 350 MG/ML SOLN
100.0000 mL | Freq: Once | INTRAVENOUS | Status: AC | PRN
  Administered 2021-06-16: 100 mL via INTRAVENOUS

## 2021-06-16 NOTE — ED Triage Notes (Signed)
Pt reports hx of afib. Reports fluttering in his right chest x 1 week. Went to PCP yesterday and had elevated ddimer.  Denies SOB, N/V.

## 2021-06-16 NOTE — ED Provider Notes (Signed)
[facility] EMERGENCY DEPT Provider Note   CSN: [id number] Arrival date & time: 06/16/21  1146     History Chief Complaint  Patient presents with   Chest Pain    JEVAUGHN DEGOLLADO is a 55 y.o. male.  Patient is a 55 year old male who presents with some right-sided chest pain.  He has a history of hypertension, hyperlipidemia and paroxysmal atrial fibrillation.  He had an ablation in 2013.  He is not currently on anticoagulants.  He says over the last month he has been fatigued.  Over the last few weeks he has felt some increased palpitations.  And then recently has had some right side chest discomfort.  It got little worse over the last couple days.  He saw his PCP yesterday who ordered some blood work.  He had an elevated D-dimer and was sent here for further evaluation.  He does not have any shortness of breath.  No cough or cold symptoms other than he has a little bit of a cough in the morning.  No fevers.  No exertional chest pain.  He is followed by Dr. Jens Som with cardiology.  He has had some intermittent leg swelling in a while.  He was started on Lasix by his PCP.  He says it is worse after he eats salt or drinks alcohol.      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/22/2021   Arthropathy of cervical facet joint 01/22/2021   Numbness 08/30/2020   OSA (obstructive sleep apnea) 08/30/2020   Family history of MS (multiple sclerosis) 08/30/2020   Other fatigue 08/10/2020   Lymphadenopathy of right cervical region 04/29/2013   Neck pain 04/29/2013   Clavicle enlargement 04/29/2013   Nonspecific abnormal unspecified cardiovascular function study 09/07/2012   Atrial fibrillation (HCC) 07/29/2012   Hypertension  12/12/2011   Hyperlipidemia 12/12/2011   Chest discomfort 10/22/2011   Palpitations 10/22/2011    Past Surgical History:  Procedure Laterality Date   ATRIAL FIBRILLATION ABLATION N/A 10/20/2012   Procedure: ATRIAL FIBRILLATION ABLATION;  Surgeon: Hillis Range, MD;  Location: [facility] CATH LAB;  Service: Cardiovascular;  Laterality: N/A;   LAMINECTOMY     LAPAROSCOPIC CHOLECYSTECTOMY  07/04/2004 [place]   DR. PAUL TOTH   TEE WITHOUT CARDIOVERSION  10/19/2012   Procedure: TRANSESOPHAGEAL ECHOCARDIOGRAM (TEE);  Surgeon: Pricilla Riffle, MD;  Location: [facility] ENDOSCOPY;  Service: Cardiovascular;  Laterality: N/A;       Family History  Problem Relation Age of Onset   Hypertension Father    Heart attack Paternal Grandfather 36   Breast cancer Paternal Grandmother 69    Social History   Tobacco Use   Smoking status: Never   Smokeless tobacco: Never  Substance Use Topics   Alcohol use: Yes    Alcohol/week: 0.0 standard drinks    Comment: rare   Drug use: No    Home Medications Prior to Admission medications   Medication Sig Start Date End Date Taking? Authorizing Provider  furosemide (LASIX) 20 MG tablet Take 20 mg by mouth.   Yes [provider]  aspirin EC 81 MG tablet Take 1 tablet (81 mg total) by mouth daily. 04/22/13   Hillis Range, MD  Cyanocobalamin (B-12 PO) Take  1,000 mcg by mouth daily.    [provider]  diltiazem (TIAZAC) 360 MG 24 hr capsule TAKE 1 CAPSULE DAILY 10/17/20   Lewayne Bunting, MD  losartan (COZAAR) 50 MG tablet Take 1 tablet (50 mg total) by mouth at bedtime. 08/23/20   Lewayne Bunting, MD  Potassium Citrate 15 MEQ (1620 MG) TBCR Take 4 tablets by mouth daily. 12/03/18   [provider]  rosuvastatin (CRESTOR) 20 MG tablet Take 20 mg by mouth at bedtime. 01/11/20   [provider]  VITAMIN D PO Take 2 tablets by mouth daily.    [provider]    Allergies    Other  Review of Systems   Review of Systems   Constitutional:  Positive for fatigue. Negative for chills, diaphoresis and fever.  HENT:  Negative for congestion, rhinorrhea and sneezing.   Eyes: Negative.   Respiratory:  Positive for cough. Negative for chest tightness and shortness of breath.   Cardiovascular:  Positive for chest pain and leg swelling (None today).  Gastrointestinal:  Negative for abdominal pain, blood in stool, diarrhea, nausea and vomiting.  Genitourinary:  Negative for difficulty urinating, flank pain, frequency and hematuria.  Musculoskeletal:  Negative for arthralgias and back pain.  Skin:  Negative for rash.  Neurological:  Negative for dizziness, speech difficulty, weakness, numbness and headaches.   Physical Exam Updated Vital Signs BP 130/83   Pulse 60   Temp 98.2 F (36.8 C) (Oral)   Resp 19   Ht 5\' 10"  (1.778 m)   Wt 97.5 kg   SpO2 99%   BMI 30.85 kg/m   Physical Exam Constitutional:      Appearance: He is well-developed.  HENT:     Head: Normocephalic and atraumatic.  Eyes:     Pupils: Pupils are equal, round, and reactive to light.  Cardiovascular:     Rate and Rhythm: Normal rate and regular rhythm.     Heart sounds: Normal heart sounds.  Pulmonary:     Effort: Pulmonary effort is normal. No respiratory distress.     Breath sounds: Normal breath sounds. No wheezing or rales.  Chest:     Chest wall: No tenderness.  Abdominal:     General: Bowel sounds are normal.     Palpations: Abdomen is soft.     Tenderness: There is no abdominal tenderness. There is no guarding or rebound.  Musculoskeletal:        General: Normal range of motion.     Cervical back: Normal range of motion and neck supple.     Comments: No edema or calf tenderness  Lymphadenopathy:     Cervical: No cervical adenopathy.  Skin:    General: Skin is warm and dry.     Findings: No rash.  Neurological:     Mental Status: He is alert and oriented to person, place, and time.    ED Results / Procedures /  Treatments   Labs (all labs ordered are listed, but only abnormal results are displayed) Labs Reviewed  BASIC METABOLIC PANEL - Abnormal; Notable for the following components:      Result Value   Potassium 3.2 (*)    All other components within normal limits  CBC WITH DIFFERENTIAL/PLATELET  TROPONIN I (HIGH SENSITIVITY)    EKG EKG Interpretation  Date/Time:  Saturday June 16 2021 12:00:18 EDT Ventricular Rate:  81 PR Interval:  162 QRS Duration: 98 QT Interval:  396 QTC Calculation: 460 R Axis:   26  Text Interpretation: Normal sinus rhythm Normal ECG since last tracing no significant change Confirmed by Rolan Bucco [phone]) on 06/16/2021 1:01:55 PM  Radiology CT Angio Chest PE W/Cm &/Or Wo Cm  Result Date: 06/16/2021 CLINICAL DATA:  Suspect pulmonary embolus. EXAM: CT ANGIOGRAPHY CHEST WITH CONTRAST TECHNIQUE: Multidetector CT imaging of the chest was performed using the standard protocol during bolus administration of intravenous contrast. Multiplanar CT image reconstructions and MIPs were obtained to evaluate the vascular anatomy. CONTRAST:  OMNIPAQUE IOHEXOL 350 MG/ML SOLN COMPARISON:  Chest x-ray on 10/05/2018 and CT chest on 10/16/2012 and CT of the abdomen and pelvis on 06/17/2018 FINDINGS: Cardiovascular: Pulmonary arteries are well opacified by contrast bolus. There is no pulmonary embolus. Heart size normal. No pericardial effusion. Mediastinum/Nodes: The visualized portion of the thyroid gland has a normal appearance. No significant mediastinal, hilar, or axillary adenopathy. Esophagus is normal. Lungs/Pleura: There is subsegmental atelectasis at both lung bases. Airways are patent. No suspicious pulmonary nodules or consolidations. No pleural effusions. Upper Abdomen: Numerous low-attenuation circumscribed oval lesions are identified within the visualized portion of the liver. Lesions are stable compared to prior CT of the abdomen and pelvis. Patient has had previous  cholecystectomy. Musculoskeletal: Mild degenerative changes are noted in the mid to LOWER thoracic spine. Review of the MIP images confirms the above findings. IMPRESSION: 1. No evidence for acute pulmonary embolus. 2. Subsegmental atelectasis at both lung bases. 3. Numerous stable low-attenuation lesions within the liver, consistent with benign lesions, likely cysts. Electronically Signed   By: Norva Pavlov M.D.   On: 06/16/2021 13:55   US Venous Img Lower Bilateral  Result Date: 06/16/2021 CLINICAL DATA:  Elevated D-dimer.  Bilateral ankle swelling. EXAM: BILATERAL LOWER EXTREMITY VENOUS DOPPLER ULTRASOUND TECHNIQUE: Gray-scale sonography with graded compression, as well as color Doppler and duplex ultrasound were performed to evaluate the lower extremity deep venous systems from the level of the common femoral vein and including the common femoral, femoral, profunda femoral, popliteal and calf veins including the posterior tibial, peroneal and gastrocnemius veins when visible. The superficial great saphenous vein was also interrogated. Spectral Doppler was utilized to evaluate flow at rest and with distal augmentation maneuvers in the common femoral, femoral and popliteal veins. COMPARISON:  None. FINDINGS: RIGHT LOWER EXTREMITY Common Femoral Vein: No evidence of thrombus. Normal compressibility, respiratory phasicity and response to augmentation. Saphenofemoral Junction: No evidence of thrombus. Normal compressibility and flow on color Doppler imaging. Profunda Femoral Vein: No evidence of thrombus. Normal compressibility and flow on color Doppler imaging. Femoral Vein: No evidence of thrombus. Normal compressibility, respiratory phasicity and response to augmentation. Popliteal Vein: No evidence of thrombus. Normal compressibility, respiratory phasicity and response to augmentation. Calf Veins: No evidence of thrombus. Normal compressibility and flow on color Doppler imaging. Superficial Great Saphenous  Vein: No evidence of thrombus. Normal compressibility. Venous Reflux:  None. Other Findings:  None. LEFT LOWER EXTREMITY Common Femoral Vein: No evidence of thrombus. Normal compressibility, respiratory phasicity and response to augmentation. Saphenofemoral Junction: No evidence of thrombus. Normal compressibility and flow on color Doppler imaging. Profunda Femoral Vein: No evidence of thrombus. Normal compressibility and flow on color Doppler imaging. Femoral Vein: No evidence of thrombus. Normal compressibility, respiratory phasicity and response to augmentation. Popliteal Vein: No evidence of thrombus. Normal compressibility, respiratory phasicity and response to augmentation. Calf Veins: No evidence of thrombus. Normal compressibility and flow on color Doppler imaging. Superficial Great Saphenous Vein: No evidence of thrombus. Normal compressibility. Venous Reflux:  None. Other Findings:  None. IMPRESSION: No evidence of deep venous thrombosis in either lower extremity. Electronically Signed   By: Richarda OverlieAdam  Henn M.D.   On: 06/16/2021 13:27    Procedures Procedures   Medications Ordered in ED Medications  iohexol (OMNIPAQUE) 350 MG/ML injection 100 mL (100 mLs Intravenous Contrast Given 06/16/21 1328)    ED Course  I have reviewed the triage vital signs and the nursing notes.  Pertinent labs & imaging results that were available during my care of the patient were reviewed by me and considered in my medical decision making (see chart for details).    MDM Rules/Calculators/A&P                           Patient is a 55 year old male who presents with palpitations and some vague right chest discomfort.  He had had an elevated D-dimer by his PCP.  He does not really have any shortness of breath.  He had a CT scan of his chest which showed no evidence of PE.  Doppler ultrasound of his lower extremity showed no evidence of DVT.  His labs are nonconcerning.  His troponin is negative.  I did not repeat this  given that his chest discomfort is been going on for the last couple days.  He does not have any arrhythmias noted in the ED.  No ischemic changes on EKG.  He has an appointment on Tuesday to follow-up with his cardiologist.  I feel this is appropriate.  He may need a Holter monitor and/or an echo given the symptoms associated with some increased leg swelling and fatigue.  Return precautions were given. Final Clinical Impression(s) / ED Diagnoses Final diagnoses:  Palpitations  Chest discomfort    Rx / DC Orders ED Discharge Orders     None        Rolan BuccoBelfi, Jo-Ann Johanning, MD 06/16/21 1443

## 2021-06-16 NOTE — ED Provider Notes (Signed)
Emergency Medicine Provider Triage Evaluation Note  Kevin Holmes , a 55 y.o. male  was evaluated in triage.  Pt complains of patient has a history of A. fib.  Has had some palpitations over the last few days with some mild discomfort in his right chest.  He saw his PCP yesterday who ordered a D-dimer which was positive.  He was sent here to have a CT chest.  He denies any shortness of breath.  No leg swelling.  Has had some in the past and recently started Lasix but none currently..  Review of Systems  Positive: Chest pain, palpitations Negative: Calf pain or swelling, shortness of breath  Physical Exam  BP (!) 153/93 (BP Location: Left Arm)   Pulse 82   Temp 98.2 F (36.8 C) (Oral)   Resp 18   Ht 5\' 10"  (1.778 m)   Wt 97.5 kg   SpO2 99%   BMI 30.85 kg/m  Gen:   Awake, no distress    Resp:  Normal effort   MSK:   Moves extremities without difficulty   Other:     Medical Decision Making  Medically screening exam initiated at 12:08 PM.  Appropriate orders placed.  was informed that the remainder of the evaluation will be completed by another provider, this initial triage assessment does not replace that evaluation, and the importance of remaining in the ED until their evaluation is complete.      Tasia Catchings, MD 06/16/21 1209

## 2021-06-16 NOTE — Discharge Instructions (Addendum)
Follow-up with Dr. Jens Som on Tuesday.  Return here as needed for any worsening symptoms.

## 2021-06-16 NOTE — ED Notes (Signed)
Patient is resting comfortably, visitor at bedside. No needs at this time.

## 2021-06-17 NOTE — Progress Notes (Signed)
Cardiology Office Note:    Date:  06/19/2021   ID:  Kevin CatchingsJohn W Pry, DOB 10/20/1966, MRN [id number]  PCP:  Ileana LaddWong, Francis P, MD   [facility] Providers Cardiologist:  Olga MillersBrian Crenshaw, MD Advanced Heart Failure:  Hillis RangeJames Allred, MD      Referring MD: Ileana LaddWong, Francis P, MD   Evaluation of his continued chest discomfort  History of Present Illness:    Kevin Holmes is a 55 y.o. male with a hx of hypertension, atrial fibrillation, OSA, palpitations, hyperlipidemia, fatigue, and left arm pain.  He underwent ablation in 2018.  His anticoagulation was stopped after that.  Since that time he has had only occasional episodes of palpitations that were nonsustained.  He was noted to have fatigue while on beta blocking medications and was placed on diltiazem.  He was also placed on Aldactone which was stopped secondary to breast discomfort.  He was last seen by Susa RaringMcElroy PA-C on 08/10/2020.  He reported he was in the process of being evaluated for multiple sclerosis by neurology.  He presented to the emergency department on 06/16/2021 complaints of palpitations and chest discomfort.  He reported that his chest discomfort was on his right side.  He also noted increased palpitations over the past few weeks.  He presented to his PCP who ordered blood work.  He was noted to have an elevated D-dimer and was sent to the emergency department for further evaluation.  He denied shortness of breath.  He denied exertional chest discomfort.  His blood pressure at that time was 130/83 with a pulse of 60.  His EKG showed normal sinus rhythm 81 bpm.  His CT chest showed no evidence of PE.  Doppler ultrasound of his lower extremity showed no evidence of DVT.  His lab work was unremarkable and his troponins were negative.  It was recommended that he have a cardiac event monitor or echocardiogram to evaluate his symptoms associated with his lower extremity swelling and fatigue.  He was discharged in stable condition on  06/16/2021.  He presents to the clinic today for follow-up evaluation states he has had fatigue for the last 5 years.  We reviewed his labs and diagnostics from the emergency department.  His vitamin D was on the low side of normal.  He has Alpha-gal syndrome from a tick bite.  He does not eat as much meat.  He reports compliance with his CPAP.  He has noticed some increased lower extremity swelling has been placed on furosemide as needed by his PCP.  He is also taking supplemental potassium.  I will order a vitamin B12 lab, repeat his echocardiogram, give him a salty 6 diet sheet, given the Rote support stocking sheet, and have him follow-up in 3 months.  I have asked him to start back with his vitamin D supplementation as well.  Today he denies chest pain, shortness of breath, lower extremity edema, fatigue, palpitations, melena, hematuria, hemoptysis, diaphoresis, weakness, presyncope, syncope, orthopnea, and PND.   Past Medical History:  Diagnosis Date   History of gallstones    LAPAROSCOPIC CHOLECYSTECTOMY 2005   Hypertension    Paroxysmal atrial fibrillation (HCC)    Pure hypercholesterolemia     Past Surgical History:  Procedure Laterality Date   ATRIAL FIBRILLATION ABLATION N/A 10/20/2012   Procedure: ATRIAL FIBRILLATION ABLATION;  Surgeon: Hillis RangeJames Allred, MD;  [facility];  Service: Cardiovascular;  Laterality: N/A;   LAMINECTOMY     LAPAROSCOPIC CHOLECYSTECTOMY  07/04/2004 [facility]   DR.  PAUL TOTH   TEE WITHOUT CARDIOVERSION  10/19/2012   Procedure: TRANSESOPHAGEAL ECHOCARDIOGRAM (TEE);  Surgeon: Pricilla Riffle, MD;  Location: [facility] ENDOSCOPY;  Service: Cardiovascular;  Laterality: N/A;    Current Medications: Current Meds  Medication Sig   aspirin EC 81 MG tablet Take 1 tablet (81 mg total) by mouth daily.   Cyanocobalamin (B-12 PO) Take 1,000 mcg by mouth daily.   diltiazem (TIAZAC) 360 MG 24 hr capsule TAKE 1 CAPSULE DAILY   furosemide (LASIX) 20 MG tablet Take 20 mg by  mouth.   losartan (COZAAR) 50 MG tablet Take 1 tablet (50 mg total) by mouth at bedtime.   Potassium Citrate 15 MEQ (1620 MG) TBCR Take 4 tablets by mouth daily.   rosuvastatin (CRESTOR) 20 MG tablet Take 20 mg by mouth at bedtime.   VITAMIN D PO Take 2 tablets by mouth daily.     Allergies:   Other   Social History   Socioeconomic History   Marital status: Married    Spouse name: Not on file   Number of children: Not on file   Years of education: Not on file   Highest education level: Not on file  Occupational History   Occupation: [employer]  Tobacco Use   Smoking status: Never   Smokeless tobacco: Never  Substance and Sexual Activity   Alcohol use: Yes    Alcohol/week: 0.0 standard drinks    Comment: rare   Drug use: No   Sexual activity: Not on file  Other Topics Concern   Not on file  Social History Narrative   Lives in [place].  Works at [employer] as an Art gallery manager   Caffeine use: 1 per day   Right handed    Social Determinants of Corporate investment banker Strain: Not on file  Food Insecurity: Not on file  Transportation Needs: Not on file  Physical Activity: Not on file  Stress: Not on file  Social Connections: Not on file     Family History: The patient's family history includes Breast cancer (age of onset: 49) in his paternal grandmother; Heart attack (age of onset: 60) in his paternal grandfather; Hypertension in his father.  ROS:   Please see the history of present illness.     All other systems reviewed and are negative.   Risk Assessment/Calculations:           Physical Exam:    VS:  BP (!) 148/70   Pulse 77   Ht 5\' 10"  (1.778 m)   Wt 223 lb (101.2 kg)   SpO2 97%   BMI 32.00 kg/m     Wt Readings from Last 3 Encounters:  06/19/21 223 lb (101.2 kg)  06/16/21 215 lb (97.5 kg)  01/22/21 220 lb 8 oz (100 kg)     GEN:  Well nourished, well developed in no acute distress HEENT: Normal NECK: No JVD; No carotid bruits LYMPHATICS: No  lymphadenopathy CARDIAC: RRR, no murmurs, rubs, gallops RESPIRATORY:  Clear to auscultation without rales, wheezing or rhonchi  ABDOMEN: Soft, non-tender, non-distended MUSCULOSKELETAL:  No edema; No deformity  SKIN: Warm and dry NEUROLOGIC:  Alert and oriented x 3, chronic fatigue PSYCHIATRIC:  Normal affect    EKGs/Labs/Other Studies Reviewed:    The following studies were reviewed today: Myoview 02/01/2020- Nuclear stress EF: 60%. No wall motion abnormalities There was no ST segment deviation noted during stress. The study is normal. This is a low risk study. No evidence of ischemia or infarction.   Echo  02/01/2020- IMPRESSIONS     1. Left ventricular ejection fraction, by estimation, is 55 to 60%. Left  ventricular ejection fraction by 3D volume is 56 %. The left ventricle has  normal function. The left ventricle has no regional wall motion  abnormalities. There is mild left  ventricular hypertrophy. Left ventricular diastolic parameters were  normal. The average left ventricular global longitudinal strain is -19.0  %.   2. Right ventricular systolic function is normal. The right ventricular  size is normal. Tricuspid regurgitation signal is inadequate for assessing  PA pressure.   3. The mitral valve is normal in structure. No evidence of mitral valve  regurgitation.   4. The aortic valve is tricuspid. Aortic valve regurgitation is not  visualized. No aortic stenosis is present.   5. Aortic dilatation noted. There is mild dilatation of the ascending  aorta measuring 38 mm.   6. The inferior vena cava is normal in size with greater than 50%  respiratory variability, suggesting right atrial pressure of 3 mmHg.   Comparison(s): 08/03/12 EF 60-65%.    EKG:  EKG is not ordered today.    Recent Labs: 06/16/2021: BUN 10; Creatinine, Ser 0.97; Hemoglobin 14.9; Platelets 232; Potassium 3.2; Sodium 142  Recent Lipid Panel    Component Value Date/Time   CHOL 162 04/23/2019  1005   TRIG 126 04/23/2019 1005   HDL 44 04/23/2019 1005   CHOLHDL 3.7 04/23/2019 1005   CHOLHDL 4 09/16/2014 1003   VLDL 13.8 09/16/2014 1003   LDLCALC 93 04/23/2019 1005    ASSESSMENT & PLAN    Chest discomfort/fatigue/ DOE-no chest discomfort today.  Recently seen and evaluated in the emergency department 06/16/2021 for chest discomfort and palpitations.  He also presented to his PCP and noted a elevated D-dimer.  His chest CT was unremarkable and his lower extremity ultrasounds were negative for DVT.  His lab work was unremarkable and troponins were negative. Order echocardiogram Order B12  Paroxysmal atrial fibrillation-underwent ablation 2018.  Anticoagulation stopped after ablation procedure.  Denies recent episodes of accelerated heart rate or irregular heartbeats.  Does not tolerate beta-blockade agents due to fatigue. Continue diltiazem Lower extremity support stockings Avoid triggers caffeine, chocolate, EtOH, dehydration etc.  Essential hypertension-BP today 148/70.  Well-controlled at home.  Did not tolerate Aldactone due to breast discomfort.  Not a candidate for beta-blockers due to fatigue. Continue losartan, diltiazem Heart healthy low-sodium diet-salty 6 given Increase physical activity as tolerated  Hyperlipidemia-LDL 93 on 04/23/2019 Continue rosuvastatin Heart healthy low-sodium high-fiber diet Increase physical activity as tolerated   Disposition: Follow-up with Dr. Jens Som or me after echocardiogram.       Medication Adjustments/Labs and Tests Ordered: Current medicines are reviewed at length with the patient today.  Concerns regarding medicines are outlined above.  Orders Placed This Encounter  Procedures   B12   ECHOCARDIOGRAM COMPLETE   No orders of the defined types were placed in this encounter.   Patient Instructions  Medication Instructions:  Your physician recommends that you continue on your current medications as directed. Please refer  to the Current Medication list given to you today.  *If you need a refill on your cardiac medications before your next appointment, please call your pharmacy*   Lab Work: Your physician recommends that you have blood work drawn at your earliest convenience: B12  If you have labs (blood work) drawn today and your tests are completely normal, you will receive your results only by: MyChart Message (if you have MyChart)  OR A paper copy in the mail If you have any lab test that is abnormal or we need to change your treatment, we will call you to review the results.   Testing/Procedures: Your physician has requested that you have an echocardiogram. Echocardiography is a painless test that uses sound waves to create images of your heart. It provides your doctor with information about the size and shape of your heart and how well your heart's chambers and valves are working. This procedure takes approximately one hour. There are no restrictions for this procedure.   Follow-Up: At [facility], you and your health needs are our priority.  As part of our continuing mission to provide you with exceptional heart care, we have created designated Provider Care Teams.  These Care Teams include your primary Cardiologist (physician) and Advanced Practice Providers (APPs -  Physician Assistants and Nurse Practitioners) who all work together to provide you with the care you need, when you need it.  We recommend signing up for the patient portal called "MyChart".  Sign up information is provided on this After Visit Summary.  MyChart is used to connect with patients for Virtual Visits (Telemedicine).  Patients are able to view lab/test results, encounter notes, upcoming appointments, etc.  Non-urgent messages can be sent to your provider as well.   To learn more about what you can do with MyChart, go to [email or web address].    Your next appointment:   3 month(s)  The format for your next appointment:    In Person  Provider:   You may see Olga Millers, MD or one of the following Advanced Practice Providers on your designated Care Team:   Edd Fabian, FNP   Other Instructions  Edd Fabian, FNP has recommended Compression Stockings. Please see the sheet given to you today about where to purchase these.       Signed, Ronney Asters, NP  06/19/2021 3:52 PM      Notice: This dictation was prepared with Dragon dictation along with smaller phrase technology. Any transcriptional errors that result from this process are unintentional and may not be corrected upon review.  I spent 15 minutes examining this patient, reviewing medications, and using patient centered shared decision making involving her cardiac care.  Prior to her visit I spent greater than 20 minutes reviewing her past medical history,  medications, and prior cardiac tests.

## 2021-06-19 ENCOUNTER — Ambulatory Visit (HOSPITAL_BASED_OUTPATIENT_CLINIC_OR_DEPARTMENT_OTHER): Payer: BC Managed Care – PPO | Admitting: General Practice

## 2021-06-19 ENCOUNTER — Other Ambulatory Visit: Payer: Self-pay

## 2021-06-19 ENCOUNTER — Encounter (HOSPITAL_BASED_OUTPATIENT_CLINIC_OR_DEPARTMENT_OTHER): Payer: Self-pay | Admitting: General Practice

## 2021-06-19 VITALS — BP 148/70 | HR 77 | Ht 70.0 in | Wt 223.0 lb

## 2021-06-19 DIAGNOSIS — I480 Paroxysmal atrial fibrillation: Secondary | ICD-10-CM

## 2021-06-19 DIAGNOSIS — R0600 Dyspnea, unspecified: Secondary | ICD-10-CM

## 2021-06-19 DIAGNOSIS — I10 Essential (primary) hypertension: Secondary | ICD-10-CM

## 2021-06-19 DIAGNOSIS — R5382 Chronic fatigue, unspecified: Secondary | ICD-10-CM | POA: Diagnosis not present

## 2021-06-19 DIAGNOSIS — R5383 Other fatigue: Secondary | ICD-10-CM

## 2021-06-19 DIAGNOSIS — R0609 Other forms of dyspnea: Secondary | ICD-10-CM

## 2021-06-19 DIAGNOSIS — E7800 Pure hypercholesterolemia, unspecified: Secondary | ICD-10-CM

## 2021-06-19 NOTE — Patient Instructions (Signed)
Medication Instructions:  Your physician recommends that you continue on your current medications as directed. Please refer to the Current Medication list given to you today.  *If you need a refill on your cardiac medications before your next appointment, please call your pharmacy*   Lab Work: Your physician recommends that you have blood work drawn at your earliest convenience: B12  If you have labs (blood work) drawn today and your tests are completely normal, you will receive your results only by: MyChart Message (if you have MyChart) OR A paper copy in the mail If you have any lab test that is abnormal or we need to change your treatment, we will call you to review the results.   Testing/Procedures: Your physician has requested that you have an echocardiogram. Echocardiography is a painless test that uses sound waves to create images of your heart. It provides your doctor with information about the size and shape of your heart and how well your heart's chambers and valves are working. This procedure takes approximately one hour. There are no restrictions for this procedure.   Follow-Up: At [facility], you and your health needs are our priority.  As part of our continuing mission to provide you with exceptional heart care, we have created designated Provider Care Teams.  These Care Teams include your primary Cardiologist (physician) and Advanced Practice Providers (APPs -  Physician Assistants and Nurse Practitioners) who all work together to provide you with the care you need, when you need it.  We recommend signing up for the patient portal called "MyChart".  Sign up information is provided on this After Visit Summary.  MyChart is used to connect with patients for Virtual Visits (Telemedicine).  Patients are able to view lab/test results, encounter notes, upcoming appointments, etc.  Non-urgent messages can be sent to your provider as well.   To learn more about what you can do with  MyChart, go to [email or web address].    Your next appointment:   3 month(s)  The format for your next appointment:   In Person  Provider:   You may see Olga Millers, MD or one of the following Advanced Practice Providers on your designated Care Team:   Edd Fabian, FNP   Other Instructions  Edd Fabian, FNP has recommended Compression Stockings. Please see the sheet given to you today about where to purchase these.

## 2021-06-20 ENCOUNTER — Encounter (HOSPITAL_BASED_OUTPATIENT_CLINIC_OR_DEPARTMENT_OTHER): Payer: Self-pay

## 2021-06-20 DIAGNOSIS — R002 Palpitations: Secondary | ICD-10-CM | POA: Diagnosis not present

## 2021-06-21 DIAGNOSIS — G4733 Obstructive sleep apnea (adult) (pediatric): Secondary | ICD-10-CM | POA: Diagnosis not present

## 2021-06-25 ENCOUNTER — Ambulatory Visit: Payer: BC Managed Care – PPO | Admitting: General Practice

## 2021-06-26 DIAGNOSIS — R609 Edema, unspecified: Secondary | ICD-10-CM | POA: Diagnosis not present

## 2021-07-06 ENCOUNTER — Other Ambulatory Visit: Payer: Self-pay

## 2021-07-06 ENCOUNTER — Ambulatory Visit (HOSPITAL_COMMUNITY): Payer: BC Managed Care – PPO | Attending: Internal Medicine

## 2021-07-06 DIAGNOSIS — R5382 Chronic fatigue, unspecified: Secondary | ICD-10-CM | POA: Diagnosis not present

## 2021-07-06 DIAGNOSIS — R0609 Other forms of dyspnea: Secondary | ICD-10-CM

## 2021-07-06 DIAGNOSIS — R5383 Other fatigue: Secondary | ICD-10-CM | POA: Diagnosis not present

## 2021-07-06 DIAGNOSIS — R0600 Dyspnea, unspecified: Secondary | ICD-10-CM | POA: Diagnosis not present

## 2021-07-06 LAB — ECHOCARDIOGRAM COMPLETE
Area-P 1/2: 3.31 cm2
S' Lateral: 3.6 cm

## 2021-07-09 ENCOUNTER — Telehealth: Payer: Self-pay | Admitting: Cardiology

## 2021-07-09 NOTE — Telephone Encounter (Signed)
Follow Up:     Patient says he would like to talk to Kevin Holmes about his lab results.

## 2021-07-09 NOTE — Telephone Encounter (Signed)
Spoke with pt, echocardiogram results discussed and questions answered.

## 2021-07-12 ENCOUNTER — Telehealth: Payer: Self-pay | Admitting: Cardiology

## 2021-07-12 NOTE — Telephone Encounter (Signed)
Patient called wanting to speak with the nurse. Please advise

## 2021-07-12 NOTE — Telephone Encounter (Signed)
Pt calling with concerns with increased heart palpitations. He states he has noticed them more frequently in the last few weeks. He is concerned his AF has returned. He also has read excess Vit D supplements may cause heart palpitations and he started taking Vit D supplements a few weeks ago.  I advised him he may stop his Vit D (he is taking OTC) to see if this helps decrease his palps. In the meantime, stay hydrated, avoid excess alcohol, and eat a well balanced diet. I will forward to Dr. Jens Som and his RN for follow up and recommendation.

## 2021-07-18 ENCOUNTER — Other Ambulatory Visit: Payer: Self-pay | Admitting: *Deleted

## 2021-07-18 ENCOUNTER — Ambulatory Visit (INDEPENDENT_AMBULATORY_CARE_PROVIDER_SITE_OTHER): Payer: BC Managed Care – PPO

## 2021-07-18 DIAGNOSIS — R002 Palpitations: Secondary | ICD-10-CM

## 2021-07-18 NOTE — Telephone Encounter (Signed)
S/w pt is aware of Dr. Ludwig Clarks recommendations.  14 day monitor placed order will be mailed today.

## 2021-07-18 NOTE — Progress Notes (Unsigned)
Enrolled patient for a 14 day Zio XT  monitor to be mailed to patients home  °

## 2021-07-22 DIAGNOSIS — R002 Palpitations: Secondary | ICD-10-CM | POA: Diagnosis not present

## 2021-08-22 ENCOUNTER — Other Ambulatory Visit: Payer: Self-pay | Admitting: Cardiology

## 2021-08-22 DIAGNOSIS — I10 Essential (primary) hypertension: Secondary | ICD-10-CM

## 2021-09-17 NOTE — Progress Notes (Addendum)
Cardiology Clinic Note   Patient Name: Kevin Holmes Date of Encounter: 09/19/2021  Primary Care Provider:  Vernie Shanks, MD Primary Cardiologist:  Kirk Ruths, MD  Patient [place] presents the clinic for follow-up of his chest discomfort and palpitations.  Past Medical History    Past Medical History:  Diagnosis Date   History of gallstones    LAPAROSCOPIC CHOLECYSTECTOMY 2005   Hypertension    Paroxysmal atrial fibrillation ([place])    Pure hypercholesterolemia    Past Surgical History:  Procedure Laterality Date   ATRIAL FIBRILLATION ABLATION N/A 10/20/2012   Procedure: ATRIAL FIBRILLATION ABLATION;  Surgeon: Thompson Grayer, MD;  Location: [facility] CATH LAB;  Service: Cardiovascular;  Laterality: N/A;   LAMINECTOMY     LAPAROSCOPIC CHOLECYSTECTOMY  07/04/2004 [place]   DR. PAUL TOTH   TEE WITHOUT CARDIOVERSION  10/19/2012   Procedure: TRANSESOPHAGEAL ECHOCARDIOGRAM (TEE);  Surgeon: Fay Records, MD;  Location: [facility] ENDOSCOPY;  Service: Cardiovascular;  Laterality: N/A;    Allergies  Allergies  Allergen Reactions   Other Other (See Comments)    Meat allergy    History of Present Illness    Kevin Holmes is a 55 y.o. male with a hx of hypertension, atrial fibrillation, OSA, palpitations, hyperlipidemia, fatigue, and left arm pain.  He underwent ablation in 2018.  His anticoagulation was stopped after that.  Since that time he has had only occasional episodes of palpitations that were nonsustained.  He was noted to have fatigue while on beta blocking medications and was placed on diltiazem.  He was also placed on Aldactone which was stopped secondary to breast discomfort.  He was last seen by Tanja Port on 08/10/2020.  He reported he was in the process of being evaluated for multiple sclerosis by neurology.   He presented to the emergency department on 06/16/2021 complaints of palpitations and chest discomfort.  He reported that his chest discomfort was on  his right side.  He also noted increased palpitations over the past few weeks.  He presented to his PCP who ordered blood work.  He was noted to have an elevated D-dimer and was sent to the emergency department for further evaluation.  He denied shortness of breath.  He denied exertional chest discomfort.  His blood pressure at that time was 130/83 with a pulse of 60.  His EKG showed normal sinus rhythm 81 bpm.  His CT chest showed no evidence of PE.  Doppler ultrasound of his lower extremity showed no evidence of DVT.  His lab work was unremarkable and his troponins were negative.  It was recommended that he have a cardiac event monitor or echocardiogram to evaluate his symptoms associated with his lower extremity swelling and fatigue.  He was discharged in stable condition on 06/16/2021.   He presents to the clinic 06/19/2021 for follow-up evaluation stated he had fatigue for the last 5 years.  We reviewed his labs and diagnostics from the emergency department.  His vitamin D was on the low side of normal.  He has Alpha-gal syndrome from a tick bite.  He does not eat as much meat.  He reported compliance with his CPAP.  He had noticed some increased lower extremity swelling had been placed on furosemide as needed by his PCP.  He was also taking supplemental potassium.  I will ordered a vitamin B12 lab, repeated his echocardiogram, gave him a salty 6 diet sheet, gave the [place] support stocking sheet, and planned  follow-up in 3 months.  I  asked him to start back with his vitamin D supplementation as well.  His echocardiogram 07/06/2021 showed LVEF 60-65% and no significant valvular abnormalities.   He presents the clinic today for follow-up evaluation states he stopped taking his furosemide.  He noticed that after he stopped taking the medication this palpitations significantly reduced.  He continues to take his potassium for hypokalemia.  We reviewed his echocardiogram and his medication regimen.  He reports  that he was recently traveling and has had some nasal congestion.  He is not very physically active and has no increased heart rate with increased physical activity.  During that time he was not using his CPAP.  He feels that his blood pressure is elevated today due to not using CPAP.  We discussed stopping losartan and starting valsartan medication, maintain a blood pressure log and plan to have a BMP in 1 week.  We will plan follow-up in 3 months.  I have asked him to increase his physical activity as tolerated.  Today he denies chest pain, shortness of breath, lower extremity edema, fatigue, palpitations, melena, hematuria, hemoptysis, diaphoresis, weakness, presyncope, syncope, orthopnea, and PND.  Home Medications    Prior to Admission medications   Medication Sig Start Date End Date Taking? Authorizing Provider  aspirin EC 81 MG tablet Take 1 tablet (81 mg total) by mouth daily. 04/22/13   Allred, Jeneen Rinks, MD  Cyanocobalamin (B-12 PO) Take 1,000 mcg by mouth daily.    [provider]  diltiazem (TIAZAC) 360 MG 24 hr capsule TAKE 1 CAPSULE DAILY 10/17/20   Lelon Perla, MD  furosemide (LASIX) 20 MG tablet Take 20 mg by mouth.    [provider]  losartan (COZAAR) 50 MG tablet TAKE ONE TABLET AT BEDTIME 08/22/21   Lelon Perla, MD  Potassium Citrate 15 MEQ (1620 MG) TBCR Take 4 tablets by mouth daily. 12/03/18   [provider]  rosuvastatin (CRESTOR) 20 MG tablet Take 20 mg by mouth at bedtime. 01/11/20   [provider]  VITAMIN D PO Take 2 tablets by mouth daily.    [provider]    Family History    Family History  Problem Relation Age of Onset   Hypertension Father    Heart attack Paternal Grandfather 27   Breast cancer Paternal Grandmother 17   He indicated that his mother is alive. He indicated that his father is alive. He indicated that the status of his paternal grandmother is unknown. He indicated that his paternal grandfather  is deceased.  Social History    Social History   Socioeconomic History   Marital status: Married    Spouse name: Not on file   Number of children: Not on file   Years of education: Not on file   Highest education level: Not on file  Occupational History   Occupation: [employer]  Tobacco Use   Smoking status: Never   Smokeless tobacco: Never  Substance and Sexual Activity   Alcohol use: Yes    Alcohol/week: 0.0 standard drinks    Comment: rare   Drug use: No   Sexual activity: Not on file  Other Topics Concern   Not on file  Social History Narrative   Lives in [place].  Works at [employer] as an Chief Financial Officer   Caffeine use: 1 per day   Right handed    Social Determinants of Radio broadcast assistant Strain: Not on file  Food Insecurity: Not on  file  Transportation Needs: Not on file  Physical Activity: Not on file  Stress: Not on file  Social Connections: Not on file  Intimate Partner Violence: Not on file     Review of Systems    General:  No chills, fever, night sweats or weight changes.  Cardiovascular:  No chest pain, dyspnea on exertion, edema, orthopnea, palpitations, paroxysmal nocturnal dyspnea. Dermatological: No rash, lesions/masses Respiratory: No cough, dyspnea Urologic: No hematuria, dysuria Abdominal:   No nausea, vomiting, diarrhea, bright red blood per rectum, melena, or hematemesis Neurologic:  No visual changes, wkns, changes in mental status. All other systems reviewed and are otherwise negative except as noted above.  Physical Exam    VS:  BP (!) 152/74   Pulse 83   Ht 5\' 10"  (1.778 m)   Wt 217 lb 9.6 oz (98.7 kg)   SpO2 99%   BMI 31.22 kg/m  , BMI Body mass index is 31.22 kg/m. GEN: Well nourished, well developed, in no acute distress. HEENT: normal. Neck: Supple, no JVD, carotid bruits, or masses. Cardiac: RRR, no murmurs, rubs, or gallops. No clubbing, cyanosis, generalized bilateral lower extremity nonpitting edema.  Radials/DP/PT 2+ and  equal bilaterally.  Respiratory:  Respirations regular and unlabored, clear to auscultation bilaterally. GI: Soft, nontender, nondistended, BS + x 4. MS: no deformity or atrophy. Skin: warm and dry, no rash. Neuro:  Strength and sensation are intact. Psych: Normal affect.  Accessory Clinical Findings    Recent Labs: 06/16/2021: BUN 10; Creatinine, Ser 0.97; Hemoglobin 14.9; Platelets 232; Potassium 3.2; Sodium 142   Recent Lipid Panel    Component Value Date/Time   CHOL 162 04/23/2019 1005   TRIG 126 04/23/2019 1005   HDL 44 04/23/2019 1005   CHOLHDL 3.7 04/23/2019 1005   CHOLHDL 4 09/16/2014 1003   VLDL 13.8 09/16/2014 1003   LDLCALC 93 04/23/2019 1005    ECG personally reviewed by me today-none today.  Myoview 02/01/2020- Nuclear stress EF: 60%. No wall motion abnormalities There was no ST segment deviation noted during stress. The study is normal. This is a low risk study. No evidence of ischemia or infarction.  Echocardiogram 07/06/2021  IMPRESSIONS     1. Left ventricular ejection fraction, by estimation, is 60 to 65%. The  left ventricle has normal function. The left ventricle has no regional  wall motion abnormalities. The left ventricular internal cavity size was  moderately dilated. Left ventricular  diastolic parameters were normal.   2. Right ventricular systolic function is normal. The right ventricular  size is normal.   3. Global longitudinal strain is -24.6%.   4. The mitral valve is normal in structure. Trivial mitral valve  regurgitation.   5. The aortic valve is tricuspid. Aortic valve regurgitation is not  visualized. Mild aortic valve sclerosis is present, with no evidence of  aortic valve stenosis.   6. Aortic dilatation noted.   Comparison(s): The left ventricular function is unchanged.  Cardiac event monitor 08/07/2021 Patch Wear Time:  10 days and 22 hours (2022-09-11T22:27:06-399 to 2022-09-22T20:51:30-0400)   Patient had a min HR of 51  bpm, max HR of 158 bpm, and avg HR of 72 bpm. Predominant underlying rhythm was Sinus Rhythm. 10 Supraventricular Tachycardia runs occurred, the run with the fastest interval lasting 12 beats with a max rate of 158 bpm, the  longest lasting 11 beats with an avg rate of 97 bpm. Supraventricular Tachycardia was detected within +/- 45 seconds of symptomatic patient event(s). Isolated SVEs were rare (<  1.0%), SVE Couplets were rare (<1.0%), and SVE Triplets were rare (<1.0%).  Isolated VEs were rare (<1.0%), VE Couplets were rare (<1.0%), and no VE Triplets were present. Ventricular Trigeminy was present.    Sinus bradycardia, NSR, sinus tachycardia, occasional pac, brief PAT, rare PVC and couplet [place]   1.  Chest discomfort/fatigue/ DOE-no chest discomfort today.  Follow-up echocardiogram 07/06/2021 showed normal LVEF and no significant valvular abnormalities.  Details above.  Was previously seen and evaluated in the emergency department 06/16/2021 for chest discomfort and palpitations.  He also presented to his PCP and noted a elevated D-dimer.  His chest CT was unremarkable and his lower extremity ultrasounds were negative for DVT.  His lab work was unremarkable and troponins were negative. Continue heart healthy low-sodium diet Maintain p.o. hydration Increase physical activity as tolerated Follow-up with PCP   Paroxysmal atrial fibrillation-heart rate today 83 bpm.  Underwent ablation 2018.  Cardiac event monitor 08/07/2021 showed no significant arrhythmias.  Anticoagulation stopped after ablation procedure.  Denies recent episodes of accelerated heart rate or irregular heartbeats.  Does not tolerate beta-blockade agents due to fatigue. Continue diltiazem Continue furosemide as needed, potassium as needed Lower extremity support stockings Avoid triggers caffeine, chocolate, EtOH, dehydration etc.   Essential hypertension-BP today 152/74.  Well-controlled at home.  Did  not tolerate Aldactone due to breast discomfort.  Not a candidate for beta-blockers due to fatigue. Continue  diltiazem Stop losartan Valsartan 40 daily Heart healthy low-sodium diet-salty 6 given-reviewed Increase physical activity as tolerated  BMP 1 week   Hyperlipidemia-LDL 93 on 04/23/2019 Continue rosuvastatin Heart healthy low-sodium high-fiber diet Increase physical activity as tolerated Follows with PCP   Disposition: Follow-up with Dr. Stanford Breed or me in 3-4 months.   Jossie Ng. Jacqui Headen NP-C    09/19/2021, 12:15 PM [address] Office [phone] Fax [phone]  Notice: This dictation was prepared with Dragon dictation along with smaller phrase technology. Any transcriptional errors that result from this process are unintentional and may not be corrected upon review.  I spent 14 minutes examining this patient, reviewing medications, and using patient centered shared decision making involving her cardiac care.  Prior to her visit I spent greater than 20 minutes reviewing her past medical history,  medications, and prior cardiac tests.

## 2021-09-19 ENCOUNTER — Telehealth: Payer: Self-pay

## 2021-09-19 ENCOUNTER — Other Ambulatory Visit: Payer: Self-pay

## 2021-09-19 ENCOUNTER — Ambulatory Visit: Payer: BC Managed Care – PPO | Admitting: General Practice

## 2021-09-19 ENCOUNTER — Encounter: Payer: Self-pay | Admitting: General Practice

## 2021-09-19 VITALS — BP 152/74 | HR 83 | Ht 70.0 in | Wt 217.6 lb

## 2021-09-19 DIAGNOSIS — Z79899 Other long term (current) drug therapy: Secondary | ICD-10-CM

## 2021-09-19 DIAGNOSIS — I10 Essential (primary) hypertension: Secondary | ICD-10-CM

## 2021-09-19 DIAGNOSIS — R0609 Other forms of dyspnea: Secondary | ICD-10-CM

## 2021-09-19 DIAGNOSIS — G4733 Obstructive sleep apnea (adult) (pediatric): Secondary | ICD-10-CM | POA: Diagnosis not present

## 2021-09-19 DIAGNOSIS — R5383 Other fatigue: Secondary | ICD-10-CM

## 2021-09-19 DIAGNOSIS — E7800 Pure hypercholesterolemia, unspecified: Secondary | ICD-10-CM

## 2021-09-19 DIAGNOSIS — R5382 Chronic fatigue, unspecified: Secondary | ICD-10-CM

## 2021-09-19 DIAGNOSIS — I480 Paroxysmal atrial fibrillation: Secondary | ICD-10-CM | POA: Diagnosis not present

## 2021-09-19 MED ORDER — VALSARTAN 40 MG PO TABS: 40.0000 mg | ORAL_TABLET | Freq: Every day | ORAL | 6 refills | Status: DC

## 2021-09-19 NOTE — Patient Instructions (Signed)
Medication Instructions:  STOP LOSARTAN  START VALSARTAN 40MG  DAILY  TAKE FUROSEMIDE (LASIX) AS NEEDED *If you need a refill on your cardiac medications before your next appointment, please call your pharmacy*  Lab Work: BMET NEXT WEEK OR THE WEEK AFTER(WHEN YOU RETURN) If you have labs (blood work) drawn today and your tests are completely normal, you will receive your results only by:  MyChart Message (if you have MyChart) OR A paper copy in the mail.  If you have any lab test that is abnormal or we need to change your treatment, we will call you to review the results. You may go to any [facility] that is convenient for you however, we do have a lab in our office that is able to assist you. You DO NOT need an appointment for our lab. The lab is open 8:00am and closes at 4:00pm. Lunch 12:45 - 1:45pm.  Testing/Procedures: NONE  Special Instructions START USING YOUR CPAP AGAIN  PLEASE INCREASE PHYSICAL ACTIVITY AS TOLERATED-GOAL IS 150 MINUTES WEEKLY  Follow-Up: Your next appointment:  3 month(s) In Person with , MD   At [facility], you and your health needs are our priority.  As part of our continuing mission to provide you with exceptional heart care, we have created designated Provider Care Teams.  These Care Teams include your primary Cardiologist (physician) and Advanced Practice Providers (APPs -  Physician Assistants and Nurse Practitioners) who all work together to provide you with the care you need, when you need it.

## 2021-09-19 NOTE — Telephone Encounter (Signed)
Per Edd Fabian, FNP-C: stop potassium and only take with Lasix.  Called pt and he states that he takes the 4 pills because his "body does not produce potassium like it should " so he doesn't want to stop. Notified Edd Fabian, FNP-C ok to continue to take potassium daily. He will change his note.

## 2021-09-20 ENCOUNTER — Telehealth: Payer: Self-pay | Admitting: General Practice

## 2021-09-20 NOTE — Telephone Encounter (Signed)
Returned call to patient-patient states he saw Edd Fabian NP yesterday and they reviewed test results. His blood pressure was noted to be elevated at visit-152/74, thought this was due to being at the doctor's office. States NP rechecked at the end of the visit and it was still elevated.   He also reports he mentioned left sided flank pain yesterday, was assessed by NP.   Reports medication changes were made, stopped losartan and started valsartan 40 mg daily, advised to keep BP log.   States he started valsartan last night, BP this AM 160/100.   Took AM meds (dilt, crestor, asa), rechecked BP 2-3 hours later was still 150s/100.  Rechecked at lunch and was 152/98.   He reports he has not been checking his BP at home for the last 2-3 months, unsure if this is new or has been ongoing.   He reports having intermittent headaches last week and this week, denies blurred vision, etc.   Reports some dizzy spells when he gets out of the bed in the AM, believes this is due to getting up to fast.     He is concerned with the elevated BP readings and ongoing left flank pain (he is concerned this is pain form his kidney contributing to his elevated BP?).   Advised it may take time to see an effect or change in BP from the medication change. Advised to continue to monitor BP, keep BP log, monitor salt intake closely and will route to [facility] NP to review for further recommendations.    Patient states he is going out of town Sunday, driving 8 hours to [place] for work.  Would like to know if NP believes this will be okay.

## 2021-09-20 NOTE — Telephone Encounter (Signed)
Attempt to return call-left message to call back 

## 2021-09-20 NOTE — Telephone Encounter (Signed)
Patient would like to speak to Edd Fabian about the visit he had yesterday with him.

## 2021-09-20 NOTE — Telephone Encounter (Signed)
Vong is returning Hayley's call.

## 2021-09-21 NOTE — Telephone Encounter (Signed)
Pt informed of providers result & recommendations. Pt verbalized understanding. No further questions . He will call with any concerns.

## 2021-10-01 ENCOUNTER — Telehealth: Payer: Self-pay | Admitting: Cardiology

## 2021-10-01 DIAGNOSIS — I10 Essential (primary) hypertension: Secondary | ICD-10-CM | POA: Diagnosis not present

## 2021-10-01 NOTE — Telephone Encounter (Signed)
Patient would like to go to [facility] to have his labs done.  If his lab orders can please be faxed to [phone]. There office number is [phone].  He would like to go today.

## 2021-10-01 NOTE — Telephone Encounter (Signed)
Spoke with pt, aware lab orders faxed.

## 2021-10-10 ENCOUNTER — Other Ambulatory Visit: Payer: Self-pay | Admitting: Cardiology

## 2021-10-10 DIAGNOSIS — I4891 Unspecified atrial fibrillation: Secondary | ICD-10-CM

## 2021-10-19 DIAGNOSIS — G4733 Obstructive sleep apnea (adult) (pediatric): Secondary | ICD-10-CM | POA: Diagnosis not present

## 2021-11-16 ENCOUNTER — Other Ambulatory Visit: Payer: Self-pay

## 2021-11-16 ENCOUNTER — Emergency Department (HOSPITAL_BASED_OUTPATIENT_CLINIC_OR_DEPARTMENT_OTHER): Payer: BC Managed Care – PPO

## 2021-11-16 ENCOUNTER — Emergency Department (HOSPITAL_BASED_OUTPATIENT_CLINIC_OR_DEPARTMENT_OTHER)
Admission: EM | Admit: 2021-11-16 | Discharge: 2021-11-16 | Disposition: A | Discharge status: Home / Self Care | Payer: BC Managed Care – PPO | Attending: Emergency Medicine | Admitting: Emergency Medicine

## 2021-11-16 ENCOUNTER — Encounter (HOSPITAL_BASED_OUTPATIENT_CLINIC_OR_DEPARTMENT_OTHER): Payer: Self-pay

## 2021-11-16 DIAGNOSIS — Z7982 Long term (current) use of aspirin: Secondary | ICD-10-CM | POA: Diagnosis not present

## 2021-11-16 DIAGNOSIS — R109 Unspecified abdominal pain: Secondary | ICD-10-CM | POA: Diagnosis not present

## 2021-11-16 DIAGNOSIS — N132 Hydronephrosis with renal and ureteral calculous obstruction: Secondary | ICD-10-CM | POA: Diagnosis not present

## 2021-11-16 DIAGNOSIS — K7689 Other specified diseases of liver: Secondary | ICD-10-CM | POA: Diagnosis not present

## 2021-11-16 DIAGNOSIS — N200 Calculus of kidney: Secondary | ICD-10-CM

## 2021-11-16 DIAGNOSIS — N210 Calculus in bladder: Secondary | ICD-10-CM | POA: Diagnosis not present

## 2021-11-16 LAB — URINALYSIS, ROUTINE W REFLEX MICROSCOPIC
Bilirubin Urine: NEGATIVE
Glucose, UA: NEGATIVE mg/dL
Ketones, ur: NEGATIVE mg/dL
Leukocytes,Ua: NEGATIVE
Nitrite: NEGATIVE
Protein, ur: 30 mg/dL — AB
RBC / HPF: >50 RBC/hpf — ABNORMAL HIGH (ref 0–5)
Specific Gravity, Urine: 1.024 (ref 1.005–1.030)
pH: 7 (ref 5.0–8.0)

## 2021-11-16 LAB — COMPREHENSIVE METABOLIC PANEL
ALT: 37 U/L (ref 0–44)
AST: 22 U/L (ref 15–41)
Albumin: 4.3 g/dL (ref 3.5–5.0)
Alkaline Phosphatase: 86 U/L (ref 38–126)
Anion gap: 9 (ref 5–15)
BUN: 15 mg/dL (ref 6–20)
CO2: 27 mmol/L (ref 22–32)
Calcium: 9 mg/dL (ref 8.9–10.3)
Chloride: 104 mmol/L (ref 98–111)
Creatinine, Ser: 1.35 mg/dL — ABNORMAL HIGH (ref 0.61–1.24)
GFR, Estimated: >60 mL/min (ref >60)
Glucose, Bld: 109 mg/dL — ABNORMAL HIGH (ref 70–99)
Potassium: 3.4 mmol/L — ABNORMAL LOW (ref 3.5–5.1)
Sodium: 140 mmol/L (ref 135–145)
Total Bilirubin: 0.9 mg/dL (ref 0.3–1.2)
Total Protein: 7.3 g/dL (ref 6.5–8.1)

## 2021-11-16 LAB — CBC
HCT: 42 % (ref 39.0–52.0)
Hemoglobin: 14.7 g/dL (ref 13.0–17.0)
MCH: 29.8 pg (ref 26.0–34.0)
MCHC: 35 g/dL (ref 30.0–36.0)
MCV: 85.2 fL (ref 80.0–100.0)
Platelets: 216 10*3/uL (ref 150–400)
RBC: 4.93 MIL/uL (ref 4.22–5.81)
RDW: 12.2 % (ref 11.5–15.5)
WBC: 9.5 10*3/uL (ref 4.0–10.5)
nRBC: 0 % (ref 0.0–0.2)

## 2021-11-16 MED ORDER — ONDANSETRON HCL 4 MG/2ML IJ SOLN
4.0000 mg | Freq: Once | INTRAMUSCULAR | Status: AC
  Administered 2021-11-16: 4 mg via INTRAVENOUS
  Filled 2021-11-16: qty 2

## 2021-11-16 MED ORDER — KETOROLAC TROMETHAMINE 30 MG/ML IJ SOLN
15.0000 mg | Freq: Once | INTRAMUSCULAR | Status: AC
  Administered 2021-11-16: 15 mg via INTRAVENOUS
  Filled 2021-11-16: qty 1

## 2021-11-16 MED ORDER — HYDROMORPHONE HCL 1 MG/ML IJ SOLN
0.5000 mg | Freq: Once | INTRAMUSCULAR | Status: AC
  Administered 2021-11-16: 0.5 mg via INTRAVENOUS
  Filled 2021-11-16: qty 1

## 2021-11-16 MED ORDER — ONDANSETRON 4 MG PO TBDP: 4.0000 mg | ORAL_TABLET | Freq: Three times a day (TID) | ORAL | 0 refills | Status: DC | PRN

## 2021-11-16 MED ORDER — OXYCODONE-ACETAMINOPHEN 5-325 MG PO TABS: 1.0000 | ORAL_TABLET | Freq: Three times a day (TID) | ORAL | 0 refills | Status: DC | PRN

## 2021-11-16 MED ORDER — FENTANYL CITRATE PF 50 MCG/ML IJ SOSY
50.0000 ug | PREFILLED_SYRINGE | INTRAMUSCULAR | Status: DC | PRN
  Filled 2021-11-16: qty 1

## 2021-11-16 MED ORDER — TAMSULOSIN HCL 0.4 MG PO CAPS: 0.4000 mg | ORAL_CAPSULE | Freq: Every day | ORAL | 0 refills | Status: DC

## 2021-11-16 MED ORDER — SODIUM CHLORIDE 0.9 % IV BOLUS
500.0000 mL | Freq: Once | INTRAVENOUS | Status: AC
  Administered 2021-11-16: 500 mL via INTRAVENOUS

## 2021-11-16 NOTE — ED Triage Notes (Signed)
Pt presents with LLQ pain and vomiting since 0130 this am. Pt reports a hx of kidney stones but doesn't feel like hs previous stones. Pt reports "twinges" for a month to the same area

## 2021-11-16 NOTE — Discharge Instructions (Addendum)
Follow-up with your primary care doctor about the abnormality of your pancreas on the CT scan.

## 2021-11-16 NOTE — ED Provider Notes (Signed)
[place] EMERGENCY DEPT Provider Note   CSN: [id number] Arrival date & time: 11/16/21  0847     History  Chief Complaint  Patient presents with   Abdominal Pain   Flank Pain    Kevin Holmes is a 56 y.o. male.   Abdominal Pain Flank Pain Associated symptoms include abdominal pain.  Patient presents with left-sided abdominal/flank pain.  Became more severe last night.  For the last couple weeks has been having episodes of twinges of pain on the side.  Cannot find a comfortable position.  No urinary symptoms.  No diarrhea or constipation.  No fevers or chills.  Has previous kidney stones and unsure if this feels the same.  No diarrhea.  No constipation.  Is on aspirin    Home Medications Prior to Admission medications   Medication Sig Start Date End Date Taking? Authorizing Provider  ondansetron (ZOFRAN-ODT) 4 MG disintegrating tablet Take 1 tablet (4 mg total) by mouth every 8 (eight) hours as needed for nausea or vomiting. 11/16/21  Yes Davonna Belling, MD  oxyCODONE-acetaminophen (PERCOCET/ROXICET) 5-325 MG tablet Take 1-2 tablets by mouth every 8 (eight) hours as needed for severe pain. 11/16/21  Yes Davonna Belling, MD  tamsulosin (FLOMAX) 0.4 MG CAPS capsule Take 1 capsule (0.4 mg total) by mouth daily. 11/16/21  Yes Davonna Belling, MD  aspirin EC 81 MG tablet Take 1 tablet (81 mg total) by mouth daily. 04/22/13   Allred, Jeneen Rinks, MD  diltiazem [facility]) 360 MG 24 hr capsule TAKE 1 CAPSULE DAILY 10/10/21   Lelon Perla, MD  furosemide (LASIX) 20 MG tablet Take 20 mg by mouth as needed.    [provider]  Potassium Citrate 15 MEQ (1620 MG) TBCR Take 4 tablets by mouth daily. 12/03/18   [provider]  rosuvastatin (CRESTOR) 20 MG tablet Take 20 mg by mouth at bedtime. 01/11/20   [provider]  valsartan (DIOVAN) 40 MG tablet Take 1 tablet (40 mg total) by mouth daily. 09/19/21   Deberah Pelton, NP      Allergies    Other     Review of Systems   Review of Systems  Gastrointestinal:  Positive for abdominal pain.  Genitourinary:  Positive for flank pain.   Physical Exam Updated Vital Signs BP 132/80 (BP Location: Right Arm)    Pulse 62    Temp 98.1 F (36.7 C) (Oral)    Resp 18    SpO2 95%  Physical Exam Vitals and nursing note reviewed.  HENT:     Head: Normocephalic.  Cardiovascular:     Rate and Rhythm: Regular rhythm.  Pulmonary:     Breath sounds: No stridor. No wheezing.  Abdominal:     Hernia: No hernia is present.  Genitourinary:    Comments: Some CVA tenderness on left.  Mild left mid abdominal tenderness without rebound or guarding.  No hernia palpated. Skin:    General: Skin is warm.  Neurological:     Mental Status: He is alert.    ED Results / Procedures / Treatments   Labs (all labs ordered are listed, but only abnormal results are displayed) Labs Reviewed  URINALYSIS, ROUTINE W REFLEX MICROSCOPIC - Abnormal; Notable for the following components:      Result Value   Hgb urine dipstick SMALL (*)    Protein, ur 30 (*)    RBC / HPF >50 (*)    Bacteria, UA RARE (*)    All other components within normal limits  COMPREHENSIVE METABOLIC PANEL - Abnormal; Notable for the following components:   Potassium 3.4 (*)    Glucose, Bld 109 (*)    Creatinine, Ser 1.35 (*)    All other components within normal limits  CBC    EKG None  Radiology CT Renal Stone Study  Result Date: 11/16/2021 CLINICAL DATA:  Left lower quadrant abdominal pain with vomiting today. History of kidney stones. EXAM: CT ABDOMEN AND PELVIS WITHOUT CONTRAST TECHNIQUE: Multidetector CT imaging of the abdomen and pelvis was performed following the standard protocol without IV contrast. COMPARISON:  Abdominopelvic CT 06/17/2018. chest CT 06/16/2021 and abdominal ultrasound 10/07/2018. FINDINGS: Lower chest: Mild linear atelectasis or scarring at the lung bases. No significant pleural or pericardial effusion.  Hepatobiliary: Multiple low-density liver lesions are similar to previous CT and likely all cysts based on previous ultrasound. No suspicious hepatic findings identified. No evidence of significant biliary dilatation status post cholecystectomy. Pancreas: There is a low-density lesion within the pancreatic body, measuring 1.2 cm on image 17/2. This measures near water density and is likely cystic. This does appear slightly enlarged from 2019. No evidence of pancreatic ductal dilatation or surrounding inflammation. Spleen: Normal in size without focal abnormality. Adrenals/Urinary Tract: Both adrenal glands appear normal. Numerous nonobstructing bilateral renal calculi are noted. There is left-sided hydronephrosis with moderate asymmetric perinephric soft tissue stranding secondary to an obstructing calculus in the proximal left ureter, measuring 5 mm on image 43/2. This is located at the L3-4 disc space level and is not well seen on the scout image due to overlap with the spine. Numerous bladder calculi are again noted bilaterally. Stomach/Bowel: No enteric contrast administered. The stomach appears unremarkable for its degree of distension. No evidence of bowel wall thickening, distention or surrounding inflammatory change. The appendix appears normal. Vascular/Lymphatic: There are no enlarged abdominal or pelvic lymph nodes. Mild aortic and branch vessel atherosclerosis. Reproductive: Stable mild enlargement of the prostate gland with central calcifications. Other: No evidence of abdominal wall mass or hernia. No ascites. Musculoskeletal: No acute or significant osseous findings. Mild lower lumbar spondylosis. IMPRESSION: 1. Obstructing 5 mm calculus in the proximal left ureter with associated hydronephrosis and perinephric soft tissue stranding. 2. Numerous additional bilateral renal and bladder calculi are again noted. 3. Low-density pancreatic lesion is incompletely characterized without contrast, and appears  slightly larger from 2019 CT. Further evaluation with abdominal MRI without and with contrast recommended once the patient's acute symptoms have resolved. 4. Grossly stable hepatic cysts. 5. Mild Aortic Atherosclerosis (ICD10-I70.0). Electronically Signed   By: Richardean Sale M.D.   On: 11/16/2021 09:53    Procedures Procedures    Medications Ordered in ED Medications  fentaNYL (SUBLIMAZE) injection 50 mcg (has no administration in time range)  ondansetron (ZOFRAN) injection 4 mg (4 mg Intravenous Given 11/16/21 0931)  HYDROmorphone (DILAUDID) injection 0.5 mg (0.5 mg Intravenous Given 11/16/21 1002)  ketorolac (TORADOL) 30 MG/ML injection 15 mg (15 mg Intravenous Given 11/16/21 1028)  sodium chloride 0.9 % bolus 500 mL (500 mLs Intravenous New Bag/Given 11/16/21 1044)    ED Course/ Medical Decision Making/ A&P                           Medical Decision Making Amount and/or Complexity of Data Reviewed Independent Historian: spouse External Data Reviewed: radiology.    Details: previous ct Labs:  Decision-making details documented in ED Course. Radiology:  Decision-making details documented in ED Course.  Risk Prescription drug management.  Parenteral controlled substances. Decision regarding hospitalization.   Patient presents with left-sided abdominal pain/flank pain.  History of kidney stones.  Initial differential diagnosis somewhat broad including stones diverticulitis UTI AAA. CT without contrast done and does show 5 mm rather proximal kidney stone with some obstruction.  Urine does not show infection.  Both CT scan and urine were interpreted by me.  Creatinine mildly elevated.  Patient feels better.  Pain controlled.  Appears stable for discharge home with follow-up with [facility].  [facility]. Also has possible pancreatic abnormality.  Slightly enlarged from previous CT.  Do not think this is the cause of the pain. and patient and wife are aware of finding a  follow-up with PCP for further evaluation.  Will discharge home.        Final Clinical Impression(s) / ED Diagnoses Final diagnoses:  Kidney stone on left side    Rx / DC Orders ED Discharge Orders          Ordered    ondansetron (ZOFRAN-ODT) 4 MG disintegrating tablet  Every 8 hours PRN        11/16/21 1118    oxyCODONE-acetaminophen (PERCOCET/ROXICET) 5-325 MG tablet  Every 8 hours PRN        11/16/21 1118    tamsulosin (FLOMAX) 0.4 MG CAPS capsule  Daily        11/16/21 1118              Davonna Belling, MD 11/16/21 1121

## 2021-11-19 DIAGNOSIS — G4733 Obstructive sleep apnea (adult) (pediatric): Secondary | ICD-10-CM | POA: Diagnosis not present

## 2021-11-20 ENCOUNTER — Other Ambulatory Visit: Payer: Self-pay | Admitting: Urology

## 2021-11-20 DIAGNOSIS — N201 Calculus of ureter: Secondary | ICD-10-CM | POA: Diagnosis not present

## 2021-11-22 ENCOUNTER — Encounter (HOSPITAL_BASED_OUTPATIENT_CLINIC_OR_DEPARTMENT_OTHER): Payer: Self-pay | Admitting: Urology

## 2021-11-22 NOTE — Progress Notes (Signed)
Patient to arrive at 0600 on 11/26/2021. History and medications reviewed. Denies recent chest pain or SOB. Pre-procedure instructions given. NPO after MN on Sunday. BP med with sip of water in am. Instructed to bring CPAP. Driver secured.

## 2021-11-26 ENCOUNTER — Encounter (HOSPITAL_BASED_OUTPATIENT_CLINIC_OR_DEPARTMENT_OTHER): Payer: Self-pay | Admitting: Urology

## 2021-11-26 ENCOUNTER — Ambulatory Visit (HOSPITAL_BASED_OUTPATIENT_CLINIC_OR_DEPARTMENT_OTHER)
Admission: RE | Admit: 2021-11-26 | Discharge: 2021-11-26 | Disposition: A | Discharge status: Home / Self Care | Payer: BC Managed Care – PPO | Attending: Urology | Admitting: Urology

## 2021-11-26 ENCOUNTER — Encounter (HOSPITAL_BASED_OUTPATIENT_CLINIC_OR_DEPARTMENT_OTHER)
Admission: RE | Disposition: A | Discharge status: Home / Self Care | Payer: Self-pay | Source: Home / Self Care | Attending: Urology

## 2021-11-26 ENCOUNTER — Ambulatory Visit (HOSPITAL_COMMUNITY): Payer: BC Managed Care – PPO

## 2021-11-26 ENCOUNTER — Other Ambulatory Visit: Payer: Self-pay

## 2021-11-26 DIAGNOSIS — N200 Calculus of kidney: Secondary | ICD-10-CM | POA: Diagnosis not present

## 2021-11-26 DIAGNOSIS — N210 Calculus in bladder: Secondary | ICD-10-CM | POA: Diagnosis not present

## 2021-11-26 DIAGNOSIS — I878 Other specified disorders of veins: Secondary | ICD-10-CM | POA: Diagnosis not present

## 2021-11-26 DIAGNOSIS — N201 Calculus of ureter: Secondary | ICD-10-CM | POA: Diagnosis not present

## 2021-11-26 DIAGNOSIS — Z01818 Encounter for other preprocedural examination: Secondary | ICD-10-CM | POA: Diagnosis not present

## 2021-11-26 HISTORY — DX: Sleep apnea, unspecified: G47.30

## 2021-11-26 HISTORY — PX: EXTRACORPOREAL SHOCK WAVE LITHOTRIPSY: SHX1557

## 2021-11-26 SURGERY — LITHOTRIPSY, ESWL
Anesthesia: LOCAL | Laterality: Left

## 2021-11-26 MED ORDER — DIAZEPAM 5 MG PO TABS
10.0000 mg | ORAL_TABLET | ORAL | Status: AC
  Administered 2021-11-26: 10 mg via ORAL

## 2021-11-26 MED ORDER — DIAZEPAM 5 MG PO TABS
ORAL_TABLET | ORAL | Status: AC
  Filled 2021-11-26: qty 2

## 2021-11-26 MED ORDER — DIPHENHYDRAMINE HCL 25 MG PO CAPS
ORAL_CAPSULE | ORAL | Status: AC
  Filled 2021-11-26: qty 1

## 2021-11-26 MED ORDER — DIPHENHYDRAMINE HCL 25 MG PO CAPS
25.0000 mg | ORAL_CAPSULE | ORAL | Status: AC
  Administered 2021-11-26: 25 mg via ORAL

## 2021-11-26 MED ORDER — CIPROFLOXACIN HCL 500 MG PO TABS
500.0000 mg | ORAL_TABLET | ORAL | Status: AC
  Administered 2021-11-26: 500 mg via ORAL

## 2021-11-26 MED ORDER — CIPROFLOXACIN HCL 500 MG PO TABS
ORAL_TABLET | ORAL | Status: AC
  Filled 2021-11-26: qty 1

## 2021-11-26 MED ORDER — TAMSULOSIN HCL 0.4 MG PO CAPS: 0.4000 mg | ORAL_CAPSULE | Freq: Every day | ORAL | 0 refills | Status: DC

## 2021-11-26 MED ORDER — SODIUM CHLORIDE 0.9 % IV SOLN: INTRAVENOUS | Status: DC

## 2021-11-26 NOTE — Brief Op Note (Signed)
11/26/2021  8:14 AM  PATIENT:  Kevin Holmes  56 y.o. male  PRE-OPERATIVE DIAGNOSIS:  LEFT URETERAL STONE  POST-OPERATIVE DIAGNOSIS:  * No post-op diagnosis entered *  PROCEDURE:  Procedure(s): EXTRACORPOREAL SHOCK WAVE LITHOTRIPSY (ESWL) (Left)  SURGEON:  Surgeon(s) and Role:    * Alexis Frock, MD - Primary  PHYSICIAN ASSISTANT:   ASSISTANTS: none   ANESTHESIA:   MAC  EBL:  minimal   BLOOD ADMINISTERED:none  DRAINS: none   LOCAL MEDICATIONS USED:  NONE  SPECIMEN:  No Specimen  DISPOSITION OF SPECIMEN:  N/A  COUNTS:  YES  TOURNIQUET:  * No tourniquets in log *  DICTATION: .Note written in paper chart  PLAN OF CARE: Discharge to home after PACU  PATIENT DISPOSITION:  PACU - hemodynamically stable.   Delay start of Pharmacological VTE agent (>24hrs) due to surgical blood loss or risk of bleeding: not applicable

## 2021-11-26 NOTE — Discharge Instructions (Signed)
1 - You may have urinary urgency (bladder spasms), pass small stone fragemnts,  and bloody urine on / off for up to 2 weeks. This is normal.  2 - Call MD or go to ER for fever >102, severe pain / nausea / vomiting not relieved by medications, or acute change in medical status

## 2021-11-26 NOTE — H&P (Signed)
Kevin Holmes is an 56 y.o. male.    Chief Complaint: Pre-Op LEFT Shockwave LIthotripsy  HPI: LEFT Distal Ureteral Stone, Small Bladder Stones  1 - LEFT DIstal Ureeral STone - abtou 83mm left distal stone at L3-L4 interspace by rewcent CT, KUB today with persistacna just above Lr transverse process. Also some small bladder sotnes that he has since passed.  Kevin Madura "Hedglin " is seen to proced with LEFT shockwvae lighotirpsy. NO interval fevers.Most recent UA withtou infectious parameters.     Past Medical History:  Diagnosis Date   History of gallstones    LAPAROSCOPIC CHOLECYSTECTOMY 2005   Hypertension    Paroxysmal atrial fibrillation (HCC)    Pure hypercholesterolemia    Sleep apnea     Past Surgical History:  Procedure Laterality Date   ATRIAL FIBRILLATION ABLATION N/A 10/20/2012   Procedure: ATRIAL FIBRILLATION ABLATION;  Surgeon: Hillis Range, MD;  Location: [facility] CATH LAB;  Service: Cardiovascular;  Laterality: N/A;   LAMINECTOMY     LAPAROSCOPIC CHOLECYSTECTOMY  07/04/2004 [place]   DR. PAUL TOTH   TEE WITHOUT CARDIOVERSION  10/19/2012   Procedure: TRANSESOPHAGEAL ECHOCARDIOGRAM (TEE);  Surgeon: Pricilla Riffle, MD;  [facility] ENDOSCOPY;  Service: Cardiovascular;  Laterality: N/A;    Family History  Problem Relation Age of Onset   Hypertension Father    Heart attack Paternal Grandfather 31   Breast cancer Paternal Grandmother 41   Social History:  reports that he has never smoked. He has never used smokeless tobacco. He reports current alcohol use. He reports that he does not use drugs.  Allergies:  Allergies  Allergen Reactions   Other Other (See Comments)    Meat allergy    Medications Prior to Admission  Medication Sig Dispense Refill   diltiazem (TIAZAC) 360 MG 24 hr capsule TAKE 1 CAPSULE DAILY 90 capsule 3   ondansetron (ZOFRAN-ODT) 4 MG disintegrating tablet Take 1 tablet (4 mg total) by mouth every 8 (eight) hours as needed for nausea or vomiting. 8 tablet 0    oxyCODONE-acetaminophen (PERCOCET/ROXICET) 5-325 MG tablet Take 1-2 tablets by mouth every 8 (eight) hours as needed for severe pain. 8 tablet 0   Potassium Citrate 15 MEQ (1620 MG) TBCR Take 4 tablets by mouth daily.     rosuvastatin (CRESTOR) 20 MG tablet Take 20 mg by mouth at bedtime.     tamsulosin (FLOMAX) 0.4 MG CAPS capsule Take 1 capsule (0.4 mg total) by mouth daily. 7 capsule 0   valsartan (DIOVAN) 40 MG tablet Take 1 tablet (40 mg total) by mouth daily. 30 tablet 6   aspirin EC 81 MG tablet Take 1 tablet (81 mg total) by mouth daily. 90 tablet 3   furosemide (LASIX) 20 MG tablet Take 20 mg by mouth as needed.      No results found for this or any previous visit (from the past 48 hour(s)). No results found.  Review of Systems  Constitutional:  Negative for chills and fever.  Genitourinary:  Positive for flank pain.  All other systems reviewed and are negative.  Blood pressure (!) 159/101, pulse 64, temperature 98.5 F (36.9 C), temperature source Oral, resp. rate 16, height 5\' 10"  (1.778 m), weight 98.4 kg, SpO2 99 %. Physical Exam Vitals reviewed.  HENT:     Head: Normocephalic.     Mouth/Throat:     Mouth: Mucous membranes are moist.  Eyes:     Pupils: Pupils are equal, round, and reactive to light.  Pulmonary:  Effort: Pulmonary effort is normal.  Abdominal:     General: Abdomen is flat.  Genitourinary:    Penis: Normal.      Comments: Mild LEFTCVAT Musculoskeletal:     Cervical back: Normal range of motion.  Neurological:     General: No focal deficit present.     Mental Status: He is alert.  Psychiatric:        Mood and Affect: Mood normal.     Assessment/Plan   Proceed as planned with LEFT shockwave lithotripsy. Rirks, benefits, alternatives, expectd peri-treatemtn course discused previouslu and reiterated today.   Sebastian Ache, MD 11/26/2021, 6:43 AM

## 2021-11-28 ENCOUNTER — Encounter (HOSPITAL_BASED_OUTPATIENT_CLINIC_OR_DEPARTMENT_OTHER): Payer: Self-pay | Admitting: Urology

## 2021-12-10 DIAGNOSIS — N201 Calculus of ureter: Secondary | ICD-10-CM | POA: Diagnosis not present

## 2021-12-13 ENCOUNTER — Encounter (HOSPITAL_COMMUNITY): Payer: Self-pay | Admitting: Radiology

## 2021-12-26 NOTE — Progress Notes (Signed)
HPI: FU atrial fibrillation. Flecainide added for recurrent symptomatic atrial fibrillation but arrhythmia recurred. A followup nuclear study showed mild apical ischemia. Cardiac CT in November of 2013 showed no coronary disease and a calcium score of 0. Patient subsequently had atrial fibrillation ablation. ETT 12/18 normal. Toprol discontinued previously because of fatigue and bradycardia. Lisinopril discontinued previously because of hypotension. Nuclear study March 2021 showed ejection fraction 60% with normal perfusion.  Echocardiogram 8/22 showed normal LV function, moderate LVE, ascending aorta 4.1 cm. Monitor 9/22 showed sinus with occasional PAC, brief PAT and rare PVC/couplet. Abdominal CT January 2023 showed low-density pancreatic lesion incompletely characterized and MRI with and without contrast recommended.  Since he was last seen the patient has dyspnea with more extreme activities but not with routine activities. It is relieved with rest. It is not associated with chest pain. There is no orthopnea, PND or pedal edema. There is no syncope or palpitations. There is no exertional chest pain.   Current Outpatient Medications  Medication Sig Dispense Refill   aspirin EC 81 MG tablet Take 1 tablet (81 mg total) by mouth daily. 90 tablet 3   cholecalciferol (VITAMIN D3) 25 MCG (1000 UNIT) tablet Take 1,000 Units by mouth daily.     diltiazem (TIAZAC) 360 MG 24 hr capsule TAKE 1 CAPSULE DAILY 90 capsule 3   furosemide (LASIX) 20 MG tablet Take 20 mg by mouth as needed.     Potassium Citrate 15 MEQ (1620 MG) TBCR Take 4 tablets by mouth daily.     valsartan (DIOVAN) 40 MG tablet Take 1 tablet (40 mg total) by mouth daily. 30 tablet 6   vitamin B-12 (CYANOCOBALAMIN) 1000 MCG tablet Take 1,000 mcg by mouth daily.     No current facility-administered medications for this visit.     Past Medical History:  Diagnosis Date   History of gallstones    LAPAROSCOPIC CHOLECYSTECTOMY 2005    Hypertension    Paroxysmal atrial fibrillation ([place])    Pure hypercholesterolemia    Sleep apnea     Past Surgical History:  Procedure Laterality Date   ATRIAL FIBRILLATION ABLATION N/A 10/20/2012   Procedure: ATRIAL FIBRILLATION ABLATION;  Surgeon: Thompson Grayer, MD;  Location: [facility] CATH LAB;  Service: Cardiovascular;  Laterality: N/A;   EXTRACORPOREAL SHOCK WAVE LITHOTRIPSY Left 11/26/2021   Procedure: EXTRACORPOREAL SHOCK WAVE LITHOTRIPSY (ESWL);  Surgeon: Alexis Frock, MD;  Location: [facility];  Service: Urology;  Laterality: Left;   LAMINECTOMY     LAPAROSCOPIC CHOLECYSTECTOMY  07/04/2004 [place]   DR. PAUL TOTH   TEE WITHOUT CARDIOVERSION  10/19/2012   Procedure: TRANSESOPHAGEAL ECHOCARDIOGRAM (TEE);  Surgeon: Fay Records, MD;  Location: [facility] ENDOSCOPY;  Service: Cardiovascular;  Laterality: N/A;    Social History   Socioeconomic History   Marital status: Married    Spouse name: Not on file   Number of children: Not on file   Years of education: Not on file   Highest education level: Not on file  Occupational History   Occupation: [employer]  Tobacco Use   Smoking status: Never   Smokeless tobacco: Never  Substance and Sexual Activity   Alcohol use: Yes    Alcohol/week: 0.0 standard drinks    Comment: rare   Drug use: No   Sexual activity: Not on file  Other Topics Concern   Not on file  Social History Narrative   Lives in [place].  Works at [employer] as an Chief Financial Officer   Caffeine use: 1  per day   Right handed    Social Determinants of Health   Financial Resource Strain: Not on file  Food Insecurity: Not on file  Transportation Needs: Not on file  Physical Activity: Not on file  Stress: Not on file  Social Connections: Not on file  Intimate Partner Violence: Not on file    Family History  Problem Relation Age of Onset   Hypertension Father    Heart attack Paternal Grandfather 36   Breast cancer Paternal Grandmother 3    ROS: no fevers or chills,  productive cough, hemoptysis, dysphasia, odynophagia, melena, hematochezia, dysuria, hematuria, rash, seizure activity, orthopnea, PND, pedal edema, claudication. Remaining systems are negative.  Physical Exam: Well-developed well-nourished in no acute distress.  Skin is warm and dry.  HEENT is normal.  Neck is supple.  Chest is clear to auscultation with normal expansion.  Cardiovascular exam is regular rate and rhythm.  Abdominal exam nontender or distended. No masses palpated. Extremities show no edema. neuro grossly intact   A/P  Paroxysmal atrial fibrillation-pt remains in sinus. Continue Cardizem.  Note beta-blockade was discontinued previously because of fatigue.  Anticoagulation also previously discontinued following ablation. Hypertension-elevated; however he follows this at home and it is typically controlled.  Continue present medications and follow-up. Hyperlipidemia-Per primary care. Palpitations-continue Cardizem.  Beta-blocker discontinued previously because of bradycardia. Chronic fatigue-follow-up primary care. History of chest pain-previous cardiac CTA with no CAD and fu nuclear study negative.  No recent exertional chest pain. Possible pancreatic mass-I have asked him to fu with primary care for MRI as recommended by radiology.   Kirk Ruths, MD

## 2022-01-04 ENCOUNTER — Encounter: Payer: Self-pay | Admitting: Cardiology

## 2022-01-04 ENCOUNTER — Other Ambulatory Visit: Payer: Self-pay

## 2022-01-04 ENCOUNTER — Ambulatory Visit: Payer: BC Managed Care – PPO | Admitting: Cardiology

## 2022-01-04 VITALS — BP 148/88 | HR 78 | Resp 20 | Ht 70.0 in | Wt 223.0 lb

## 2022-01-04 DIAGNOSIS — R5383 Other fatigue: Secondary | ICD-10-CM

## 2022-01-04 DIAGNOSIS — I480 Paroxysmal atrial fibrillation: Secondary | ICD-10-CM

## 2022-01-04 DIAGNOSIS — I10 Essential (primary) hypertension: Secondary | ICD-10-CM | POA: Diagnosis not present

## 2022-01-04 DIAGNOSIS — R002 Palpitations: Secondary | ICD-10-CM

## 2022-01-04 DIAGNOSIS — E7800 Pure hypercholesterolemia, unspecified: Secondary | ICD-10-CM | POA: Diagnosis not present

## 2022-01-04 NOTE — Patient Instructions (Signed)

## 2022-01-08 ENCOUNTER — Other Ambulatory Visit: Payer: Self-pay | Admitting: Internal Medicine

## 2022-01-08 DIAGNOSIS — K869 Disease of pancreas, unspecified: Secondary | ICD-10-CM

## 2022-01-22 ENCOUNTER — Ambulatory Visit: Payer: BC Managed Care – PPO | Admitting: Neurology

## 2022-01-24 ENCOUNTER — Ambulatory Visit
Admission: RE | Admit: 2022-01-24 | Discharge: 2022-01-24 | Disposition: A | Discharge status: Home / Self Care | Payer: BC Managed Care – PPO | Source: Ambulatory Visit | Attending: Internal Medicine | Admitting: Internal Medicine

## 2022-01-24 ENCOUNTER — Other Ambulatory Visit: Payer: Self-pay

## 2022-01-24 MED ORDER — GADOBENATE DIMEGLUMINE 529 MG/ML IV SOLN
20.0000 mL | Freq: Once | INTRAVENOUS | Status: AC | PRN
  Administered 2022-01-24: 20 mL via INTRAVENOUS

## 2022-04-02 ENCOUNTER — Other Ambulatory Visit: Payer: Self-pay | Admitting: General Practice

## 2022-04-17 ENCOUNTER — Telehealth: Payer: Self-pay | Admitting: Cardiology

## 2022-04-17 DIAGNOSIS — I10 Essential (primary) hypertension: Secondary | ICD-10-CM | POA: Diagnosis not present

## 2022-04-17 NOTE — Telephone Encounter (Signed)
Pt states that he has been having pains in his neck and arms. Pt also states that he has been having chest discomfort. Pt would like for nurse to call back. Please advise

## 2022-04-17 NOTE — Telephone Encounter (Signed)
Spoke with pt. He states he is having inconsistent arm and should pains. "The chest discomfort is like what I've had before. I did take a long motorcycle ride last week, the pain did get worst. Took Tylenol yesterday, it did not seem to help. I can lay a certain way and it goes aware. I wanted to be seen to rule out a heart issue. I have an over all crappy feeling." Pt denies taking any otc pain medication today, "it's not as bad as it was yesterday but I have been limiting my movements." Will get to Dr. Jens Som to advise.

## 2022-04-17 NOTE — Telephone Encounter (Signed)
Called pt to set up APP appt. He states he is currently being seen and they will do an EKG.They will have the results of all tests faxed to our office at [phone]. Pt will call back if he needs to. No further questions/concerns voiced at this time.

## 2022-04-19 DIAGNOSIS — M549 Dorsalgia, unspecified: Secondary | ICD-10-CM | POA: Diagnosis not present

## 2022-05-09 DIAGNOSIS — M4312 Spondylolisthesis, cervical region: Secondary | ICD-10-CM | POA: Diagnosis not present

## 2022-05-09 DIAGNOSIS — M47812 Spondylosis without myelopathy or radiculopathy, cervical region: Secondary | ICD-10-CM | POA: Diagnosis not present

## 2022-05-09 DIAGNOSIS — M542 Cervicalgia: Secondary | ICD-10-CM | POA: Diagnosis not present

## 2022-05-10 ENCOUNTER — Telehealth: Payer: Self-pay | Admitting: Cardiology

## 2022-05-10 DIAGNOSIS — E756 Lipid storage disorder, unspecified: Secondary | ICD-10-CM | POA: Diagnosis not present

## 2022-05-10 DIAGNOSIS — I10 Essential (primary) hypertension: Secondary | ICD-10-CM | POA: Diagnosis not present

## 2022-05-10 DIAGNOSIS — M542 Cervicalgia: Secondary | ICD-10-CM | POA: Diagnosis not present

## 2022-05-10 NOTE — Telephone Encounter (Addendum)
Patient reports chest pain on and off for 2 weeks.He describes it as sharp pain 4/10 that is brief but happens frequently. He saw PCP 2 weeks and had EKG that was "normal". He thinks the chest pain may be due to GI issues or bone spurs in neck. He reports nausea.He is at work and will have nurse check BP and P. Will call him back for the results in 15 minutes. Fifteen minutes later, BP 148/84, P 86. Scheduled with Sherlean Foot for 7/6. Recommended to patient that if chest pain returns, to be taken to an urgent care or ED. Patient verbalized understanding.

## 2022-05-10 NOTE — Telephone Encounter (Signed)
Pt c/o of Chest Pain: STAT if CP now or developed within 24 hours  1. Are you having CP right now? no  2. Are you experiencing any other symptoms (ex. SOB, nausea, vomiting, sweating)? no  3. How long have you been experiencing CP? More frequent bt a couple months  4. Is your CP continuous or coming and going? Comes and goes  5. Have you taken Nitroglycerin? no ?   Patient states he has been having chest pains off and on and pain in his arms and neck. He says he saw his PCP and had an ekg. He states they thought it may have just been a pinched nerve for his neck and arm pain. ,

## 2022-05-15 NOTE — Progress Notes (Unsigned)
Cardiology Clinic Note   Patient Name: Kevin Holmes Date of Encounter: 05/16/2022  Primary Care Provider:  Rebekah Chesterfieldickey, Kirk[place] M, NP Primary Cardiologist:  Olga MillersBrian Crenshaw, MD  Patient Profile    Kevin Holmes presents the clinic for follow-up of his chest discomfort and palpitations.  Past Medical History    Past Medical History:  Diagnosis Date   History of gallstones    LAPAROSCOPIC CHOLECYSTECTOMY 2005   Hypertension    Paroxysmal atrial fibrillation (HCC)    Pure hypercholesterolemia    Sleep apnea    Past Surgical History:  Procedure Laterality Date   ATRIAL FIBRILLATION ABLATION N/A 10/20/2012   Procedure: ATRIAL FIBRILLATION ABLATION;  Surgeon: Hillis RangeJames Allred, MD;  [facility];  Service: Cardiovascular;  Laterality: N/A;   EXTRACORPOREAL SHOCK WAVE LITHOTRIPSY Left 11/26/2021   Procedure: EXTRACORPOREAL SHOCK WAVE LITHOTRIPSY (ESWL);  Surgeon: Sebastian AcheManny, Theodore, MD;  [facility] [place];  Service: Urology;  Laterality: Left;   LAMINECTOMY     LAPAROSCOPIC CHOLECYSTECTOMY  07/04/2004 [facility]   DR. PAUL TOTH   TEE WITHOUT CARDIOVERSION  10/19/2012   Procedure: TRANSESOPHAGEAL ECHOCARDIOGRAM (TEE);  Surgeon: Pricilla RifflePaula V Ross, MD;  [facility];  Service: Cardiovascular;  Laterality: N/A;    Allergies  Allergies  Allergen Reactions   Other Other (See Comments)    Meat allergy    History of Present Illness    Kevin Holmes is a 56 y.o. male with a hx of hypertension, atrial fibrillation, OSA, palpitations, hyperlipidemia, fatigue, and left arm pain.  He underwent ablation in 2018.  His anticoagulation was stopped after that.  Since that time he has had only occasional episodes of palpitations that were nonsustained.  He was noted to have fatigue while on beta blocking medications and was placed on diltiazem.  He was also placed on Aldactone which was stopped secondary to breast discomfort.  He was last seen by Susa RaringMcElroy PA-C on 08/10/2020.   He reported he was in the process of being evaluated for multiple sclerosis by neurology.   He presented to the emergency department on 06/16/2021 complaints of palpitations and chest discomfort.  He reported that his chest discomfort was on his right side.  He also noted increased palpitations over the past few weeks.  He presented to his PCP who ordered blood work.  He was noted to have an elevated D-dimer and was sent to the emergency department for further evaluation.  He denied shortness of breath.  He denied exertional chest discomfort.  His blood pressure at that time was 130/83 with a pulse of 60.  His EKG showed normal sinus rhythm 81 bpm.  His CT chest showed no evidence of PE.  Doppler ultrasound of his lower extremity showed no evidence of DVT.  His lab work was unremarkable and his troponins were negative.  It was recommended that he have a cardiac event monitor or echocardiogram to evaluate his symptoms associated with his lower extremity swelling and fatigue.  He was discharged in stable condition on 06/16/2021.   He presented to the clinic 06/19/2021 for follow-up evaluation stated he had fatigue for the last 5 years.  We reviewed his labs and diagnostics from the emergency department.  His vitamin D was on the low side of normal.  He has Alpha-gal syndrome from a tick bite.  He does not eat as much meat.  He reported compliance with his CPAP.  He had noticed some increased lower extremity swelling had been placed on  furosemide as needed by his PCP.  He was also taking supplemental potassium.  I will ordered a vitamin B12 lab, repeated his echocardiogram, gave him a salty 6 diet sheet, gave the [place] support stocking sheet, and planned follow-up in 3 months.  I  asked him to start back with his vitamin D supplementation as well.  His echocardiogram 07/06/2021 showed LVEF 60-65% and no significant valvular abnormalities.   He presented the clinic 09/19/2021 for follow-up evaluation stated he  stopped taking his furosemide.  He noticed that after he stopped taking the medication this palpitations significantly reduced.  He continued to take his potassium for hypokalemia.  We reviewed his echocardiogram and his medication regimen.  He reported that he was recently traveling and  had some nasal congestion.  He was not very physically active.   During that time he was not using his CPAP.  He felt that his blood pressure was elevated due to not using CPAP.  We discussed stopping losartan and starting valsartan medication, maintaining a blood pressure log and planned to have a BMP in 1 week.  Follow-up in 3 months was planned.  I have asked him to increase his physical activity as tolerated.  He was seen by Dr. Jens Som on 01/04/2022.  During that time he described more dyspnea with more extreme activities but not with routine activities.  His breathing returned to baseline at rest.  He denied chest pain.  He denied orthopnea PND and lower extremity swelling.  He denied exertional chest discomfort.  He contacted the nurse triage line on 05/10/2022.  During that time he reported having chest discomfort more frequently over the last couple months.  He described the discomfort as being off and on in his arms and neck.  He reported that he had seen his PCP and had an EKG.  His PCP felt that his discomfort was related to a pinched nerve.  He presents in clinic today for follow-up evaluation states he has noticed episodes of right arm pain and chest discomfort.  He recently completed a 2500 mile motorcycle trip.  He then had a benefit ride shortly after and noticed increased pain along his right arm.  He has noticed that when he lays flat at night he does not have any discomfort and then when he rolls to his left side he has increased pain.  He presented to his PCP who has ordered a MRI study of his neck.  We reviewed his previous CT scans and recent cholesterol.  His discomfort appears to be atypical, MSK,  versus reflux type pain.  He reports that he does not have a formal exercise routine.  I have asked him to increase his physical activity, continue to refrain from tobacco use, and we will plan to continue his current medication therapy.  We plan follow-up for 9 to 12 months.  Today he denies chest pain, shortness of breath, lower extremity edema, fatigue, palpitations, melena, hematuria, hemoptysis, diaphoresis, weakness, presyncope, syncope, orthopnea, and PND.   Home Medications    Prior to Admission medications   Medication Sig Start Date End Date Taking? Authorizing Provider  aspirin EC 81 MG tablet Take 1 tablet (81 mg total) by mouth daily. 04/22/13   Allred, Fayrene Fearing, MD  Cyanocobalamin (B-12 PO) Take 1,000 mcg by mouth daily.    [provider]  diltiazem (TIAZAC) 360 MG 24 hr capsule TAKE 1 CAPSULE DAILY 10/17/20   Lewayne Bunting, MD  furosemide (LASIX) 20 MG tablet Take 20 mg  by mouth.    [provider]  losartan (COZAAR) 50 MG tablet TAKE ONE TABLET AT BEDTIME 08/22/21   Lewayne Bunting, MD  Potassium Citrate 15 MEQ (1620 MG) TBCR Take 4 tablets by mouth daily. 12/03/18   [provider]  rosuvastatin (CRESTOR) 20 MG tablet Take 20 mg by mouth at bedtime. 01/11/20   [provider]  VITAMIN D PO Take 2 tablets by mouth daily.    [provider]    Family History    Family History  Problem Relation Age of Onset   Hypertension Father    Heart attack Paternal Grandfather 93   Breast cancer Paternal Grandmother 67   He indicated that his mother is alive. He indicated that his father is alive. He indicated that the status of his paternal grandmother is unknown. He indicated that his paternal grandfather is deceased.   Social History    Social History   Socioeconomic History   Marital status: Married    Spouse name: Not on file   Number of children: Not on file   Years of education: Not on file   Highest education level: Not on  file  Occupational History   Occupation: [employer]  Tobacco Use   Smoking status: Never   Smokeless tobacco: Never  Substance and Sexual Activity   Alcohol use: Yes    Alcohol/week: 0.0 standard drinks of alcohol    Comment: rare   Drug use: No   Sexual activity: Not on file  Other Topics Concern   Not on file  Social History Narrative   Lives in [place].  Works at [employer] as an Art gallery manager   Caffeine use: 1 per day   Right handed    Social Determinants of Corporate investment banker Strain: Not on file  Food Insecurity: Not on file  Transportation Needs: Not on file  Physical Activity: Not on file  Stress: Not on file  Social Connections: Not on file  Intimate Partner Violence: Not on file     Review of Systems    General:  No chills, fever, night sweats or weight changes.  Cardiovascular:  No chest pain, dyspnea on exertion, edema, orthopnea, palpitations, paroxysmal nocturnal dyspnea. Dermatological: No rash, lesions/masses Respiratory: No cough, dyspnea Urologic: No hematuria, dysuria Abdominal:   No nausea, vomiting, diarrhea, bright red blood per rectum, melena, or hematemesis Neurologic:  No visual changes, wkns, changes in mental status. All other systems reviewed and are otherwise negative except as noted above.  Physical Exam    VS:  BP 136/82   Pulse 78   Ht 5\' 10"  (1.778 m)   Wt 221 lb 9.6 oz (100.5 kg)   SpO2 97%   BMI 31.80 kg/m  , BMI Body mass index is 31.8 kg/m. GEN: Well nourished, well developed, in no acute distress. HEENT: normal. Neck: Supple, no JVD, carotid bruits, or masses. Cardiac: RRR, no murmurs, rubs, or gallops. No clubbing, cyanosis, generalized bilateral lower extremity nonpitting edema.  Radials/DP/PT 2+ and equal bilaterally.  Respiratory:  Respirations regular and unlabored, clear to auscultation bilaterally. GI: Soft, nontender, nondistended, BS + x 4. MS: no deformity or atrophy. Skin: warm and dry, no rash. Neuro:  Strength and  sensation are intact. Psych: Normal affect.  Accessory Clinical Findings    Recent Labs: 11/16/2021: ALT 37; BUN 15; Creatinine, Ser 1.35; Hemoglobin 14.7; Platelets 216; Potassium 3.4; Sodium 140   Recent Lipid Panel    Component Value Date/Time   CHOL 162 04/23/2019  1005   TRIG 126 04/23/2019 1005   HDL 44 04/23/2019 1005   CHOLHDL 3.7 04/23/2019 1005   CHOLHDL 4 09/16/2014 1003   VLDL 13.8 09/16/2014 1003   LDLCALC 93 04/23/2019 1005    ECG personally reviewed by me today-normal sinus rhythm 78 bpm no ST or T wave deviation  Myoview 02/01/2020- Nuclear stress EF: 60%. No wall motion abnormalities There was no ST segment deviation noted during stress. The study is normal. This is a low risk study. No evidence of ischemia or infarction.  Echocardiogram 07/06/2021  IMPRESSIONS     1. Left ventricular ejection fraction, by estimation, is 60 to 65%. The  left ventricle has normal function. The left ventricle has no regional  wall motion abnormalities. The left ventricular internal cavity size was  moderately dilated. Left ventricular  diastolic parameters were normal.   2. Right ventricular systolic function is normal. The right ventricular  size is normal.   3. Global longitudinal strain is -24.6%.   4. The mitral valve is normal in structure. Trivial mitral valve  regurgitation.   5. The aortic valve is tricuspid. Aortic valve regurgitation is not  visualized. Mild aortic valve sclerosis is present, with no evidence of  aortic valve stenosis.   6. Aortic dilatation noted.   Comparison(s): The left ventricular function is unchanged.  Cardiac event monitor 08/07/2021 Patch Wear Time:  10 days and 22 hours (2022-09-11T22:27:06-399 to 2022-09-22T20:51:30-0400)   Patient had a min HR of 51 bpm, max HR of 158 bpm, and avg HR of 72 bpm. Predominant underlying rhythm was Sinus Rhythm. 10 Supraventricular Tachycardia runs occurred, the run with the fastest interval lasting 12  beats with a max rate of 158 bpm, the  longest lasting 11 beats with an avg rate of 97 bpm. Supraventricular Tachycardia was detected within +/- 45 seconds of symptomatic patient event(s). Isolated SVEs were rare (<1.0%), SVE Couplets were rare (<1.0%), and SVE Triplets were rare (<1.0%).  Isolated VEs were rare (<1.0%), VE Couplets were rare (<1.0%), and no VE Triplets were present. Ventricular Trigeminy was present.    Sinus bradycardia, NSR, sinus tachycardia, occasional pac, brief PAT, rare PVC and couplet Olga Millers  Cardiac CT 11/13 Calcium score 0  Assessment & Plan   1.  1.  Chest discomfort/fatigue/ DOE-no chest discomfort today.  EKG today shows normal sinus rhythm 78 bpm. Echocardiogram 07/06/2021 showed normal LVEF and no significant valvular abnormalities.  Details above.  He presented to his PCP and reported neck and arm discomfort.  EKG was performed and it was felt that his discomfort was related to possible pinched nerve and an MRI has been ordered.Marland Kitchen  His chest discomfort appears to be atypical versus MSK versus reflux in nature. No plans for further ischemic evaluation. Continue heart healthy low-sodium diet Maintain p.o. hydration Increase physical activity as tolerated Follow-up with neurology   Paroxysmal atrial fibrillation-heart rate today 78 bpm.  Underwent ablation 2018.  Cardiac event monitor 08/07/2021 showed no significant arrhythmias.  Anticoagulation stopped after ablation procedure.  No recent episodes of accelerated heart rate or irregular heartbeats.  Previously did not tolerate beta-blockade agents due to fatigue. Continue diltiazem Continue furosemide as needed, potassium as needed Lower extremity support stockings Iron   Essential hypertension-BP today 136/72.  Well-controlled at home.  Did not tolerate Aldactone due to breast discomfort.  Not a candidate for beta-blockers due to fatigue.  Follow-up lab work 11/16/2021 showed potassium of 3.4 and  creatinine of 1.35. Continue  diltiazem, valsartan  Heart healthy low-sodium diet-salty 6 given-reviewed Increase physical activity as tolerated   Hyperlipidemia-LDL 82 on 05/10/22.  Continue rosuvastatin Heart healthy low-sodium high-fiber diet Increase physical activity as tolerated Follows with PCP-request lab work from PCP   Disposition: Follow-up with Dr. Jens Som or me in 9-12 months.  Thomasene Ripple. Ashante Yellin NP-C     05/16/2022, 10:54 AM [facility] [address] Office [phone] Fax [phone]  Notice: This dictation was prepared with Dragon dictation along with smaller phrase technology. Any transcriptional errors that result from this process are unintentional and may not be corrected upon review.  I spent 14 minutes examining this patient, reviewing medications, and using patient centered shared decision making involving her cardiac care.  Prior to her visit I spent greater than 20 minutes reviewing her past medical history,  medications, and prior cardiac tests.

## 2022-05-16 ENCOUNTER — Encounter: Payer: Self-pay | Admitting: General Practice

## 2022-05-16 ENCOUNTER — Ambulatory Visit: Payer: BC Managed Care – PPO | Admitting: General Practice

## 2022-05-16 VITALS — BP 136/82 | HR 78 | Ht 70.0 in | Wt 221.6 lb

## 2022-05-16 DIAGNOSIS — I10 Essential (primary) hypertension: Secondary | ICD-10-CM

## 2022-05-16 DIAGNOSIS — R5382 Chronic fatigue, unspecified: Secondary | ICD-10-CM

## 2022-05-16 DIAGNOSIS — E7800 Pure hypercholesterolemia, unspecified: Secondary | ICD-10-CM

## 2022-05-16 DIAGNOSIS — I480 Paroxysmal atrial fibrillation: Secondary | ICD-10-CM

## 2022-05-16 DIAGNOSIS — R0609 Other forms of dyspnea: Secondary | ICD-10-CM

## 2022-05-16 NOTE — Patient Instructions (Signed)
Medication Instructions:  The current medical regimen is effective;  continue present plan and medications as directed. Please refer to the Current Medication list given to you today.   *If you need a refill on your cardiac medications before your next appointment, please call your pharmacy*  Lab Work:   Testing/Procedures:  NONE    NONE  If you have labs (blood work) drawn today and your tests are completely normal, you will receive your results only by: MyChart Message (if you have MyChart) OR  A paper copy in the mail If you have any lab test that is abnormal or we need to change your treatment, we will call you to review the results.  Special Instructions PLEASE READ AND FOLLOW SALTY 6-ATTACHED-1,800mg  daily  PLEASE INCREASE PHYSICAL ACTIVITY AS TOLERATED   Follow-Up: Your next appointment:  9-12 month(s) In Person with Olga Millers, MD   Please call our office 2 months in advance to schedule this appointment  :1  At [facility], you and your health needs are our priority.  As part of our continuing mission to provide you with exceptional heart care, we have created designated Provider Care Teams.  These Care Teams include your primary Cardiologist (physician) and Advanced Practice Providers (APPs -  Physician Assistants and Nurse Practitioners) who all work together to provide you with the care you need, when you need it.  Important Information About Sugar             6 SALTY THINGS TO AVOID     1,800MG  DAILY

## 2022-06-17 ENCOUNTER — Other Ambulatory Visit: Payer: Self-pay | Admitting: Internal Medicine

## 2022-06-17 DIAGNOSIS — M5412 Radiculopathy, cervical region: Secondary | ICD-10-CM

## 2022-06-28 ENCOUNTER — Ambulatory Visit
Admission: RE | Admit: 2022-06-28 | Discharge: 2022-06-28 | Disposition: A | Discharge status: Home / Self Care | Payer: BC Managed Care – PPO | Source: Ambulatory Visit | Attending: Internal Medicine | Admitting: Internal Medicine

## 2022-06-28 DIAGNOSIS — R200 Anesthesia of skin: Secondary | ICD-10-CM | POA: Diagnosis not present

## 2022-06-28 DIAGNOSIS — M2578 Osteophyte, vertebrae: Secondary | ICD-10-CM | POA: Diagnosis not present

## 2022-06-28 DIAGNOSIS — M5412 Radiculopathy, cervical region: Secondary | ICD-10-CM

## 2022-06-28 DIAGNOSIS — M50222 Other cervical disc displacement at C5-C6 level: Secondary | ICD-10-CM | POA: Diagnosis not present

## 2022-06-28 DIAGNOSIS — M50223 Other cervical disc displacement at C6-C7 level: Secondary | ICD-10-CM | POA: Diagnosis not present

## 2022-06-28 MED ORDER — GADOBENATE DIMEGLUMINE 529 MG/ML IV SOLN
20.0000 mL | Freq: Once | INTRAVENOUS | Status: AC | PRN
  Administered 2022-06-28: 20 mL via INTRAVENOUS

## 2022-07-23 DIAGNOSIS — M542 Cervicalgia: Secondary | ICD-10-CM | POA: Diagnosis not present

## 2022-07-25 DIAGNOSIS — L570 Actinic keratosis: Secondary | ICD-10-CM | POA: Diagnosis not present

## 2022-07-25 DIAGNOSIS — X32XXXA Exposure to sunlight, initial encounter: Secondary | ICD-10-CM | POA: Diagnosis not present

## 2022-07-30 DIAGNOSIS — M79671 Pain in right foot: Secondary | ICD-10-CM | POA: Diagnosis not present

## 2022-08-02 DIAGNOSIS — X32XXXA Exposure to sunlight, initial encounter: Secondary | ICD-10-CM | POA: Diagnosis not present

## 2022-08-02 DIAGNOSIS — L820 Inflamed seborrheic keratosis: Secondary | ICD-10-CM | POA: Diagnosis not present

## 2022-08-02 DIAGNOSIS — L570 Actinic keratosis: Secondary | ICD-10-CM | POA: Diagnosis not present

## 2022-08-16 DIAGNOSIS — M542 Cervicalgia: Secondary | ICD-10-CM | POA: Diagnosis not present

## 2022-09-24 DIAGNOSIS — M79671 Pain in right foot: Secondary | ICD-10-CM | POA: Diagnosis not present

## 2022-10-07 ENCOUNTER — Other Ambulatory Visit: Payer: Self-pay | Admitting: Cardiology

## 2022-10-07 DIAGNOSIS — I4891 Unspecified atrial fibrillation: Secondary | ICD-10-CM

## 2022-11-05 ENCOUNTER — Other Ambulatory Visit: Payer: Self-pay | Admitting: Cardiology

## 2023-02-03 DIAGNOSIS — E756 Lipid storage disorder, unspecified: Secondary | ICD-10-CM | POA: Diagnosis not present

## 2023-02-03 DIAGNOSIS — M79604 Pain in right leg: Secondary | ICD-10-CM | POA: Diagnosis not present

## 2023-02-07 ENCOUNTER — Emergency Department (HOSPITAL_BASED_OUTPATIENT_CLINIC_OR_DEPARTMENT_OTHER)
Admission: EM | Admit: 2023-02-07 | Discharge: 2023-02-07 | Disposition: A | Discharge status: Home / Self Care | Payer: BC Managed Care – PPO | Attending: Emergency Medicine | Admitting: Emergency Medicine

## 2023-02-07 ENCOUNTER — Emergency Department (HOSPITAL_BASED_OUTPATIENT_CLINIC_OR_DEPARTMENT_OTHER): Payer: BC Managed Care – PPO

## 2023-02-07 ENCOUNTER — Other Ambulatory Visit: Payer: Self-pay

## 2023-02-07 ENCOUNTER — Encounter (HOSPITAL_BASED_OUTPATIENT_CLINIC_OR_DEPARTMENT_OTHER): Payer: Self-pay | Admitting: Emergency Medicine

## 2023-02-07 DIAGNOSIS — I10 Essential (primary) hypertension: Secondary | ICD-10-CM | POA: Diagnosis not present

## 2023-02-07 DIAGNOSIS — Z7982 Long term (current) use of aspirin: Secondary | ICD-10-CM | POA: Insufficient documentation

## 2023-02-07 DIAGNOSIS — I4891 Unspecified atrial fibrillation: Secondary | ICD-10-CM | POA: Diagnosis not present

## 2023-02-07 DIAGNOSIS — Z79899 Other long term (current) drug therapy: Secondary | ICD-10-CM | POA: Diagnosis not present

## 2023-02-07 DIAGNOSIS — R079 Chest pain, unspecified: Secondary | ICD-10-CM | POA: Insufficient documentation

## 2023-02-07 DIAGNOSIS — R0789 Other chest pain: Secondary | ICD-10-CM | POA: Diagnosis not present

## 2023-02-07 LAB — CBC WITH DIFFERENTIAL/PLATELET
Abs Immature Granulocytes: 0.02 10*3/uL (ref 0.00–0.07)
Basophils Absolute: 0.1 10*3/uL (ref 0.0–0.1)
Basophils Relative: 1 %
Eosinophils Absolute: 0.1 10*3/uL (ref 0.0–0.5)
Eosinophils Relative: 1 %
HCT: 41.1 % (ref 39.0–52.0)
Hemoglobin: 14.5 g/dL (ref 13.0–17.0)
Immature Granulocytes: 0 %
Lymphocytes Relative: 17 %
Lymphs Abs: 1.3 10*3/uL (ref 0.7–4.0)
MCH: 30.5 pg (ref 26.0–34.0)
MCHC: 35.3 g/dL (ref 30.0–36.0)
MCV: 86.3 fL (ref 80.0–100.0)
Monocytes Absolute: 0.5 10*3/uL (ref 0.1–1.0)
Monocytes Relative: 6 %
Neutro Abs: 6 10*3/uL (ref 1.7–7.7)
Neutrophils Relative %: 75 %
Platelets: 215 10*3/uL (ref 150–400)
RBC: 4.76 MIL/uL (ref 4.22–5.81)
RDW: 12 % (ref 11.5–15.5)
WBC: 8 10*3/uL (ref 4.0–10.5)
nRBC: 0 % (ref 0.0–0.2)

## 2023-02-07 LAB — COMPREHENSIVE METABOLIC PANEL
ALT: 38 U/L (ref 0–44)
AST: 22 U/L (ref 15–41)
Albumin: 4.7 g/dL (ref 3.5–5.0)
Alkaline Phosphatase: 78 U/L (ref 38–126)
Anion gap: 9 (ref 5–15)
BUN: 11 mg/dL (ref 6–20)
CO2: 27 mmol/L (ref 22–32)
Calcium: 9.5 mg/dL (ref 8.9–10.3)
Chloride: 103 mmol/L (ref 98–111)
Creatinine, Ser: 0.9 mg/dL (ref 0.61–1.24)
GFR, Estimated: >60 mL/min (ref >60)
Glucose, Bld: 120 mg/dL — ABNORMAL HIGH (ref 70–99)
Potassium: 3.3 mmol/L — ABNORMAL LOW (ref 3.5–5.1)
Sodium: 139 mmol/L (ref 135–145)
Total Bilirubin: 0.7 mg/dL (ref 0.3–1.2)
Total Protein: 7.6 g/dL (ref 6.5–8.1)

## 2023-02-07 LAB — TROPONIN I (HIGH SENSITIVITY)
Troponin I (High Sensitivity): 2 ng/L (ref <18)
Troponin I (High Sensitivity): 3 ng/L (ref <18)

## 2023-02-07 MED ORDER — ALUM & MAG HYDROXIDE-SIMETH 200-200-20 MG/5ML PO SUSP
15.0000 mL | Freq: Once | ORAL | Status: AC
  Administered 2023-02-07: 15 mL via ORAL
  Filled 2023-02-07: qty 30

## 2023-02-07 MED ORDER — FAMOTIDINE 20 MG PO TABS
20.0000 mg | ORAL_TABLET | Freq: Once | ORAL | Status: AC
  Administered 2023-02-07: 20 mg via ORAL
  Filled 2023-02-07: qty 1

## 2023-02-07 NOTE — ED Triage Notes (Signed)
Pt arrives pov, steady gait with c/o RT side non- radiating, intermittent CP starting last night.  Denies n/v

## 2023-02-07 NOTE — ED Notes (Signed)
Patient verbalizes understanding of discharge instructions. Opportunity for questioning and answers were provided. Patient discharged from ED.  °

## 2023-02-07 NOTE — ED Provider Notes (Signed)
[facility] Provider Note   CSN: [id number] Arrival date & time: 02/07/23  1229     History  Chief Complaint  Patient presents with   Chest Pain    Kevin Holmes is a 57 y.o. male.   Chest Pain   57 year old male presents emergency department with complaints of chest pain.  Patient reports chest pain beginning last night after eating dinner.  Describes the chest pain as right-sided and as sharp in nature.  Denies radiation of pain.  States the pain "randomly occurs lasting for a matter of seconds" before resolving spontaneously.  Denies any known exacerbating or relieving factors.  States that he initially thought symptoms were related to reflux and took antacids at home which helped some.  Denies fever, cough, congestion, shortness of breath, abdominal pain, nausea, vomiting.  Past medical history significant for hypertension, hyperlipidemia, paroxysmal atrial fibrillation with ablation in 2013 with no recurrence; patient not currently anticoagulated, OSA,  Home Medications Prior to Admission medications   Medication Sig Start Date End Date Taking? Authorizing Provider  aspirin EC 81 MG tablet Take 1 tablet (81 mg total) by mouth daily. 04/22/13   Allred, Jeneen Rinks, MD  cholecalciferol (VITAMIN D3) 25 MCG (1000 UNIT) tablet Take 1,000 Units by mouth daily.    [provider]  diltiazem (TIAZAC) 360 MG 24 hr capsule TAKE 1 CAPSULE DAILY 10/08/22   Lelon Perla, MD  EPINEPHrine 0.3 mg/0.3 mL IJ SOAJ injection SMARTSIG:IM As Directed PRN 04/02/22   [provider]  furosemide (LASIX) 20 MG tablet Take 20 mg by mouth as needed.    [provider]  Potassium Citrate 15 MEQ (1620 MG) TBCR Take 4 tablets by mouth daily. 12/03/18   [provider]  rosuvastatin (CRESTOR) 20 MG tablet Take 20 mg by mouth daily. 05/11/22   [provider]  valsartan (DIOVAN) 40 MG tablet TAKE ONE TABLET ONCE DAILY 11/05/22    Lelon Perla, MD  vitamin B-12 (CYANOCOBALAMIN) 1000 MCG tablet Take 1,000 mcg by mouth daily.    [provider]      Allergies    Other    Review of Systems   Review of Systems  Cardiovascular:  Positive for chest pain.  All other systems reviewed and are negative.   Physical Exam Updated Vital Signs BP (!) 148/98   Pulse 65   Temp 99.1 F (37.3 C) (Oral)   Resp 16   Wt 93 kg   SpO2 99%   BMI 29.41 kg/m  Physical Exam Vitals and nursing note reviewed.  Constitutional:      General: He is not in acute distress.    Appearance: He is well-developed.  HENT:     Head: Normocephalic and atraumatic.  Eyes:     Conjunctiva/sclera: Conjunctivae normal.  Cardiovascular:     Rate and Rhythm: Normal rate and regular rhythm.     Pulses: Normal pulses.     Heart sounds: No murmur heard. Pulmonary:     Effort: Pulmonary effort is normal. No respiratory distress.     Breath sounds: Normal breath sounds. No wheezing, rhonchi or rales.  Chest:     Chest wall: No tenderness.  Abdominal:     Palpations: Abdomen is soft.     Tenderness: There is no abdominal tenderness. There is no right CVA tenderness, left CVA tenderness or guarding.  Musculoskeletal:        General: No swelling.     Cervical back:  Neck supple.     Right lower leg: No edema.     Left lower leg: No edema.  Skin:    General: Skin is warm and dry.     Capillary Refill: Capillary refill takes less than 2 seconds.  Neurological:     Mental Status: He is alert.  Psychiatric:        Mood and Affect: Mood normal.     ED Results / Procedures / Treatments   Labs (all labs ordered are listed, but only abnormal results are displayed) Labs Reviewed  COMPREHENSIVE METABOLIC PANEL - Abnormal; Notable for the following components:      Result Value   Potassium 3.3 (*)    Glucose, Bld 120 (*)    All other components within normal limits  CBC WITH DIFFERENTIAL/PLATELET  TROPONIN I (HIGH SENSITIVITY)   TROPONIN I (HIGH SENSITIVITY)    EKG EKG Interpretation  Date/Time:  Friday February 07 2023 12:35:29 EDT Ventricular Rate:  73 PR Interval:  181 QRS Duration: 103 QT Interval:  415 QTC Calculation: 458 R Axis:   -13 Text Interpretation: Sinus rhythm Baseline wander in lead(s) V3 No significant change since last tracing Confirmed by Dorie Rank [phone]) on 02/07/2023 1:08:26 PM  Radiology DG Chest Port 1 View  Result Date: 02/07/2023 CLINICAL DATA:  Chest pain, atrial fibrillation in the past EXAM: PORTABLE CHEST 1 VIEW COMPARISON:  Portable exam 1255 hours compared to 10/05/2018 FINDINGS: Normal heart size, mediastinal contours, and pulmonary vascularity. Lungs clear. No infiltrate, pleural effusion, or pneumothorax. Osseous structures unremarkable. IMPRESSION: No acute abnormalities. Electronically Signed   By: Lavonia Dana M.D.   On: 02/07/2023 13:02    Procedures Procedures    Medications Ordered in ED Medications  alum & mag hydroxide-simeth (MAALOX/MYLANTA) 200-200-20 MG/5ML suspension 15 mL (15 mLs Oral Given 02/07/23 1349)  famotidine (PEPCID) tablet 20 mg (20 mg Oral Given 02/07/23 1349)    ED Course/ Medical Decision Making/ A&P                             Medical Decision Making Amount and/or Complexity of Data Reviewed Labs: ordered. Radiology: ordered.  Risk OTC drugs.   This patient presents to the ED for concern of chest pain, this involves an extensive number of treatment options, and is a complaint that carries with it a high risk of complications and morbidity.  The differential diagnosis includes ACS, PE, pneumothorax, pericarditis/Myocarditis/tamponade, aortic dissection, aortic aneurysm, musculoskeletal   Co morbidities that complicate the patient evaluation  See HPI   Additional history obtained:  Additional history obtained from EMR External records from outside source obtained and reviewed including hospital records   Lab Tests:  I Ordered,  and personally interpreted labs.  The pertinent results include: No leukocytosis noted.  No evidence of anemia.  Platelets within normal range.  Mild hypokalemia of 3.3 otherwise, electrolytes within normal range.  No transaminitis.  No renal dysfunction.  Initial troponin of 2 with repeat 3; EKG concerning for sinus rhythm with T wave inversion in V1-no acute ischemic changes from prior EKG performed   Imaging Studies ordered:  I ordered imaging studies including chest x-ray I independently visualized and interpreted imaging which showed no acute cardiopulmonary abnormality I agree with the radiologist interpretation   Cardiac Monitoring: / EKG:  The patient was maintained on a cardiac monitor.  I personally viewed and interpreted the cardiac monitored which showed an underlying rhythm of: EKG concerning  for sinus rhythm with T wave inversion in V1-no acute ischemic changes from prior EKG performed   Consultations Obtained:  N/a   Problem List / ED Course / Critical interventions / Medication management  Chest pain I ordered medication including famotidine, Maalox   Reevaluation of the patient after these medicines showed that the patient improved I have reviewed the patients home medicines and have made adjustments as needed   Social Determinants of Health:  Denies tobacco, illicit drug use.   Test / Admission - Considered:  Chest pain Vitals signs significant for mild hypertension of 140 department.. Otherwise within normal range and stable throughout visit. Laboratory/imaging studies significant for: See above 57 year old male presents emergency department with chest pain.  Chest pain seems to resolving described as sharp in nature.  Doubt ACS given delta negative troponin, lack of risk acute ischemic changes on EKG.  Patient's heart score 03.  Pain not described as pleuritic, patient without cardiac risk factors for PE/DVT so doubt PE.  Doubt dissection, aortic aneurysm.   Patient without evidence of pneumonia, pneumothorax.  Doubt tamponade, myocarditis/pericarditis.  Upon further review, patient states that he had similar symptoms in the past a couple years ago and was placed on heart monitor by cardiology with no abnormal rhythm appreciated.  Patient recommended close follow-up with cardiology for further assessment and potential monitoring.  Treatment plan discussed at length with patient and he acknowledged understanding was agreeable to said plan. Worrisome signs and symptoms were discussed with the patient, and the patient acknowledged understanding to return to the ED if noticed. Patient was stable upon discharge.        Final Clinical Impression(s) / ED Diagnoses Final diagnoses:  Chest pain, unspecified type    Rx / DC Orders ED Discharge Orders     None         Wilnette Kales, [place] 02/07/23 1611    Dorie Rank, MD 02/10/23 [phone]

## 2023-02-07 NOTE — Discharge Instructions (Signed)
The workup today was overall reassuring.  Heart enzymes are within normal limits.  No obvious ischemic changes to EKG.  Chest x-ray without abnormality.  Recommend follow-up with cardiologist for reassessment of your symptoms.  Please not hesitate to return to emergency department for worrisome signs and symptoms we discussed become apparent.

## 2023-02-28 DIAGNOSIS — M542 Cervicalgia: Secondary | ICD-10-CM | POA: Diagnosis not present

## 2023-03-03 NOTE — Progress Notes (Signed)
Cardiology Office Note:    Date:  03/04/2023   ID:  Garrette, Caine 06-Mar-1966, MRN [id number]  PCP:  Rebekah Chesterfield, NP  [facility] Providers Cardiologist:  Olga Millers, MD Advanced Heart Failure:  Hillis Range, MD (Inactive)     Referring MD: Rebekah Chesterfield, NP   Chief Complaint:  Hospitalization Follow-up     History of Present Illness:   Kevin Holmes is a 57 y.o. male with     hx of hypertension, atrial fibrillation, OSA, palpitations, hyperlipidemia, fatigue, and left arm pain.  He underwent ablation in 2018.  His anticoagulation was stopped after that.  Since that time he has had only occasional episodes of palpitations that were nonsustained.  He was noted to have fatigue while on beta blocking medications and was placed on diltiazem.  He was also placed on Aldactone which was stopped secondary to breast discomfort. Normal Coronary CTA 2013 normal myoview 2021.  He last saw Mr. Cleaver 07/2022 and stable.  In ED with chest pain 02/07/23 relieved with maalox and famotidine.   Patient comes in for f/u. Having a sharp chest pain right side of chest that comes and goes at any time. He has a lot of cervical neck problems with pain down his left arm.  Pain in shoulders with little movement. Denies chest pressure, dyspnea, palpitations, edema. 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 He has a lot of GERD and pain after he eats. Doesn't take omeprazole regularly. He loves hot spicy food. 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     Past Medical History:  Diagnosis Date   History of gallstones    LAPAROSCOPIC CHOLECYSTECTOMY 2005   Hypertension    Paroxysmal atrial fibrillation    Pure hypercholesterolemia    Sleep apnea    Current Medications: Current Meds  Medication Sig   aspirin EC 81 MG tablet Take 1 tablet (81 mg total) by mouth daily.   cholecalciferol (VITAMIN D3) 25 MCG (1000  UNIT) tablet Take 1,000 Units by mouth daily.   diltiazem (TIAZAC) 360 MG 24 hr capsule TAKE 1 CAPSULE DAILY   EPINEPHrine 0.3 mg/0.3 mL IJ SOAJ injection SMARTSIG:IM As Directed PRN   furosemide (LASIX) 20 MG tablet Take 20 mg by mouth as needed.   Potassium Citrate 15 MEQ (1620 MG) TBCR Take 4 tablets by mouth daily.   rosuvastatin (CRESTOR) 20 MG tablet Take 20 mg by mouth daily.   valsartan (DIOVAN) 40 MG tablet TAKE ONE TABLET ONCE DAILY   vitamin B-12 (CYANOCOBALAMIN) 1000 MCG tablet Take 1,000 mcg by mouth daily.    Allergies:   Other   Social History   Tobacco Use   Smoking status: Never   Smokeless tobacco: Never  Substance Use Topics   Alcohol use: Yes    Alcohol/week: 0.0 standard drinks of alcohol    Comment: rare   Drug use: No    Family Hx: The patient's family history includes Breast cancer (age of onset: 67) in his paternal grandmother; Heart attack (age of onset: 25) in his paternal grandfather; Hypertension in his father.  ROS     Physical Exam:    VS:  BP 134/83   Pulse 76   Ht  (1.778 m)   Wt 213 lb 13.6 oz (97 kg)   SpO2 98%   BMI 30.68 kg/m     Wt Readings from  Last 3 Encounters:  03/04/23 213 lb 13.6 oz (97 kg)  02/07/23 205 lb (93 kg)  05/16/22 221 lb 9.6 oz (100.5 kg)    Physical Exam  GEN: Well nourished, well developed, in no acute distress  Neck: no JVD, carotid bruits, or masses Cardiac:RRR; no murmurs, rubs, or gallops  Respiratory:  clear to auscultation bilaterally, normal work of breathing GI: soft, nontender, nondistended, + BS Ext: without cyanosis, clubbing, or edema, Good distal pulses bilaterally Neuro:  Alert and Oriented x 3,  Psych: euthymic mood, full affect        EKGs/Labs/Other Test Reviewed:    EKG:  EKG is   ordered today.  The ekg ordered today demonstrates NSR normal EKG  Recent Labs: 02/07/2023: ALT 38; BUN 11; Creatinine, Ser 0.90; Hemoglobin 14.5; Platelets 215; Potassium 3.3; Sodium 139   Recent  Lipid Panel No results for input(s): "CHOL", "TRIG", "HDL", "VLDL", "LDLCALC", "LDLDIRECT" in the last 8760 hours.   Prior CV Studies:     Myoview 02/01/2020- Nuclear stress EF: 60%. No wall motion abnormalities There was no ST segment deviation noted during stress. The study is normal. This is a low risk study. No evidence of ischemia or infarction.   Echocardiogram 07/06/2021   IMPRESSIONS     1. Left ventricular ejection fraction, by estimation, is 60 to 65%. The  left ventricle has normal function. The left ventricle has no regional  wall motion abnormalities. The left ventricular internal cavity size was  moderately dilated. Left ventricular  diastolic parameters were normal.   2. Right ventricular systolic function is normal. The right ventricular  size is normal.   3. Global longitudinal strain is -24.6%.   4. The mitral valve is normal in structure. Trivial mitral valve  regurgitation.   5. The aortic valve is tricuspid. Aortic valve regurgitation is not  visualized. Mild aortic valve sclerosis is present, with no evidence of  aortic valve stenosis.   6. Aortic dilatation noted.   Comparison(s): The left ventricular function is unchanged.   Cardiac event monitor 08/07/2021 Patch Wear Time:  10 days and 22 hours (2022-09-11T22:27:06-399 to 2022-09-22T20:51:30-0400)   Patient had a min HR of 51 bpm, max HR of 158 bpm, and avg HR of 72 bpm. Predominant underlying rhythm was Sinus Rhythm. 10 Supraventricular Tachycardia runs occurred, the run with the fastest interval lasting 12 beats with a max rate of 158 bpm, the  longest lasting 11 beats with an avg rate of 97 bpm. Supraventricular Tachycardia was detected within +/- 45 seconds of symptomatic patient event(s). Isolated SVEs were rare (<1.0%), SVE Couplets were rare (<1.0%), and SVE Triplets were rare (<1.0%).  Isolated VEs were rare (<1.0%), VE Couplets were rare (<1.0%), and no VE Triplets were present. Ventricular  Trigeminy was present.    Sinus bradycardia, NSR, sinus tachycardia, occasional pac, brief PAT, rare PVC and couplet Olga Millers   Cardiac CT 11/13 Calcium score 0  Risk Assessment/Calculations/Metrics:              ASSESSMENT & PLAN:   No problem-specific Assessment & Plan notes found for this encounter.   Chest pain after eating treated with famotidine and maalox in ED 02/07/23, calcium score 0 2013, low risk myoview 2021. Pain atypical and has a lot of GI symptoms of GERD. Will start protonix 20 mg daily and have him f/u with GI. Also is going to schedule f/u MRI abdomen for pancreatic lesion. Has severe Cspine issues causing problems as well. To f/u with  neurosurg.  PAF s/p ablation 2018, monitor 2022 no arrhythmias, anticoag stopped.    HTN controlled on tiazac and diovan  HLD on crestor. He'll schedule FLP through his PCP's office              Dispo:  No follow-ups on file.   Medication Adjustments/Labs and Tests Ordered: Current medicines are reviewed at length with the patient today.  Concerns regarding medicines are outlined above.  Tests Ordered: No orders of the defined types were placed in this encounter.  Medication Changes: No orders of the defined types were placed in this encounter.  Signed, Jacolyn Reedy, PA-C  03/04/2023 1:49 PM    [facility] [address] [place], [place], [place]  [address] Phone: [phone]; Fax: [phone]

## 2023-03-04 ENCOUNTER — Encounter: Payer: Self-pay | Admitting: Physician Assistant

## 2023-03-04 ENCOUNTER — Other Ambulatory Visit: Payer: Self-pay | Admitting: Internal Medicine

## 2023-03-04 ENCOUNTER — Ambulatory Visit: Payer: BC Managed Care – PPO | Attending: Physician Assistant | Admitting: Physician Assistant

## 2023-03-04 VITALS — BP 134/83 | HR 76 | Ht 70.0 in | Wt 213.8 lb

## 2023-03-04 DIAGNOSIS — I10 Essential (primary) hypertension: Secondary | ICD-10-CM | POA: Diagnosis not present

## 2023-03-04 DIAGNOSIS — E7849 Other hyperlipidemia: Secondary | ICD-10-CM | POA: Diagnosis not present

## 2023-03-04 DIAGNOSIS — I480 Paroxysmal atrial fibrillation: Secondary | ICD-10-CM | POA: Diagnosis not present

## 2023-03-04 DIAGNOSIS — Z87898 Personal history of other specified conditions: Secondary | ICD-10-CM

## 2023-03-04 DIAGNOSIS — K862 Cyst of pancreas: Secondary | ICD-10-CM

## 2023-03-04 MED ORDER — PANTOPRAZOLE SODIUM 20 MG PO TBEC: 20.0000 mg | DELAYED_RELEASE_TABLET | Freq: Every day | ORAL | 3 refills | Status: DC

## 2023-03-04 NOTE — Patient Instructions (Signed)
Medication Instructions:  Your physician has recommended you make the following change in your medication:  1-TAKE protonix 20 mg by mouth daily.   *If you need a refill on your cardiac medications before your next appointment, please call your pharmacy*  Lab Work: If you have labs (blood work) drawn today and your tests are completely normal, you will receive your results only by: MyChart Message (if you have MyChart) OR A paper copy in the mail If you have any lab test that is abnormal or we need to change your treatment, we will call you to review the results.  Follow-Up: At [facility], you and your health needs are our priority.  As part of our continuing mission to provide you with exceptional heart care, we have created designated Provider Care Teams.  These Care Teams include your primary Cardiologist (physician) and Advanced Practice Providers (APPs -  Physician Assistants and Nurse Practitioners) who all work together to provide you with the care you need, when you need it.  We recommend signing up for the patient portal called "MyChart".  Sign up information is provided on this After Visit Summary.  MyChart is used to connect with patients for Virtual Visits (Telemedicine).  Patients are able to view lab/test results, encounter notes, upcoming appointments, etc.  Non-urgent messages can be sent to your provider as well.   To learn more about what you can do with MyChart, go to [email or web address].    Your next appointment:   1 year(s)  Provider:   Olga Millers, MD     Other Instructions Your physician recommends that you schedule a follow-up appointment with your GI doctor and Neuro Surgery.

## 2023-03-31 ENCOUNTER — Ambulatory Visit
Admission: RE | Admit: 2023-03-31 | Discharge: 2023-03-31 | Disposition: A | Discharge status: Home / Self Care | Payer: BC Managed Care – PPO | Source: Ambulatory Visit | Attending: Internal Medicine | Admitting: Internal Medicine

## 2023-03-31 DIAGNOSIS — K862 Cyst of pancreas: Secondary | ICD-10-CM | POA: Diagnosis not present

## 2023-03-31 MED ORDER — GADOPICLENOL 0.5 MMOL/ML IV SOLN
10.0000 mL | Freq: Once | INTRAVENOUS | Status: AC | PRN
  Administered 2023-03-31: 10 mL via INTRAVENOUS

## 2023-05-02 DIAGNOSIS — R300 Dysuria: Secondary | ICD-10-CM | POA: Diagnosis not present

## 2023-05-28 ENCOUNTER — Other Ambulatory Visit: Payer: Self-pay | Admitting: Cardiology

## 2023-07-04 DIAGNOSIS — H21342 Primary cyst of pars plana, left eye: Secondary | ICD-10-CM | POA: Diagnosis not present

## 2023-07-04 DIAGNOSIS — H33102 Unspecified retinoschisis, left eye: Secondary | ICD-10-CM | POA: Diagnosis not present

## 2023-07-04 DIAGNOSIS — H353131 Nonexudative age-related macular degeneration, bilateral, early dry stage: Secondary | ICD-10-CM | POA: Diagnosis not present

## 2023-07-04 DIAGNOSIS — H35363 Drusen (degenerative) of macula, bilateral: Secondary | ICD-10-CM | POA: Diagnosis not present

## 2023-07-04 DIAGNOSIS — H43392 Other vitreous opacities, left eye: Secondary | ICD-10-CM | POA: Diagnosis not present

## 2023-07-04 DIAGNOSIS — H43812 Vitreous degeneration, left eye: Secondary | ICD-10-CM | POA: Diagnosis not present

## 2023-07-07 IMAGING — MR MR ABDOMEN WO/W CM
11 of 19 series · 24 of 48 positions shown · IV contrast (multihance)
Comparison: CT abdomen and pelvis 11/16/2021

CLINICAL DATA: Pancreatic lesion

EXAM:
MRI ABDOMEN WITHOUT AND WITH CONTRAST
TECHNIQUE: Multiplanar multisequence MR imaging of the abdomen was performed
both before and after the administration of intravenous contrast.
CONTRAST:  20mL MULTIHANCE GADOBENATE DIMEGLUMINE 529 MG/ML IV SOLN

[Series 3: cor haste · coronal · 5.0mm · 0.70mm/px · 3 of 46 slices shown]
[im 1/46]
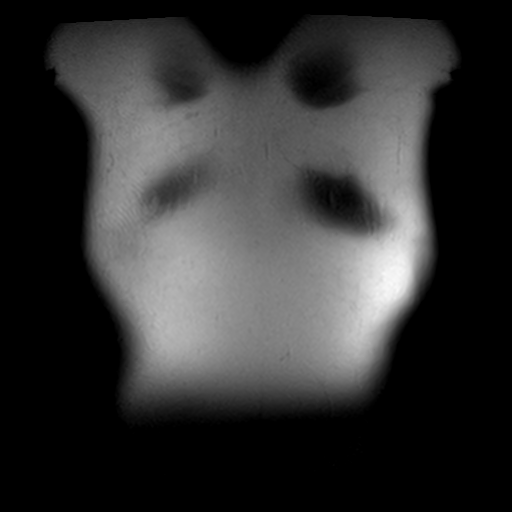
[im 23/46]
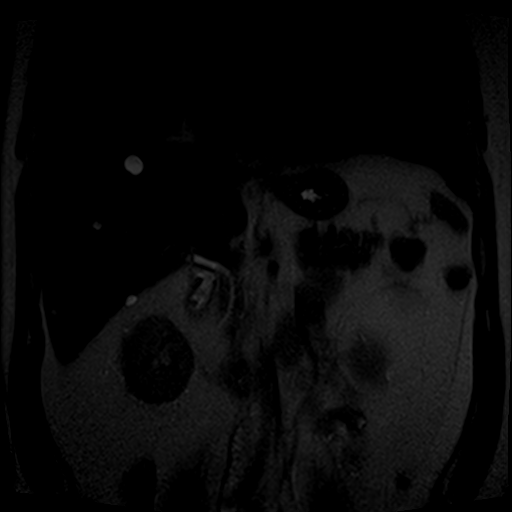
[im 46/46]
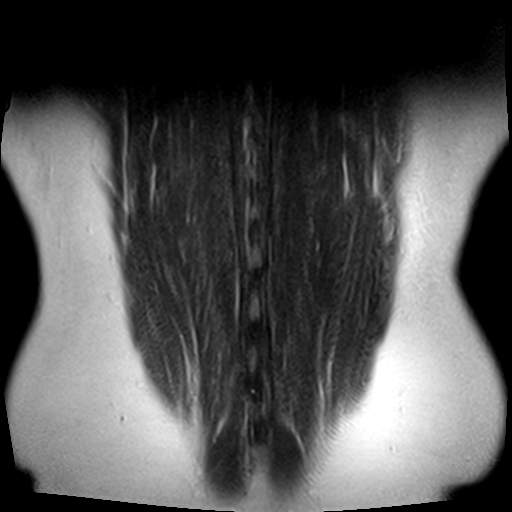

[Series 4: haste axial · axial · 5.0mm · 0.70mm/px · 1 of 36 slices shown]
[im 1/36]
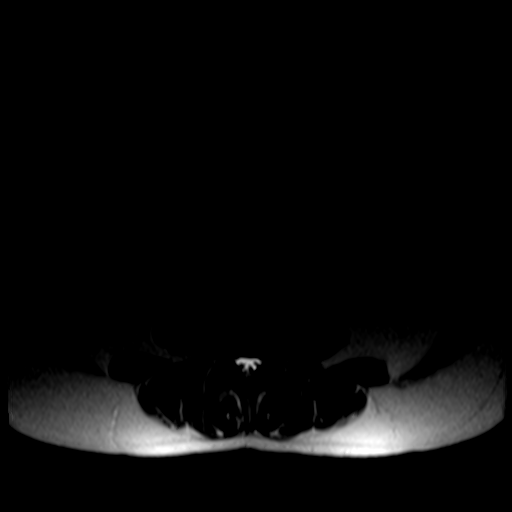

[Series 5: T1 · axial · 5.0mm · 0.70mm/px · z∈[-155,+61]mm · 3 of 74 slices shown]
[im 1/74]
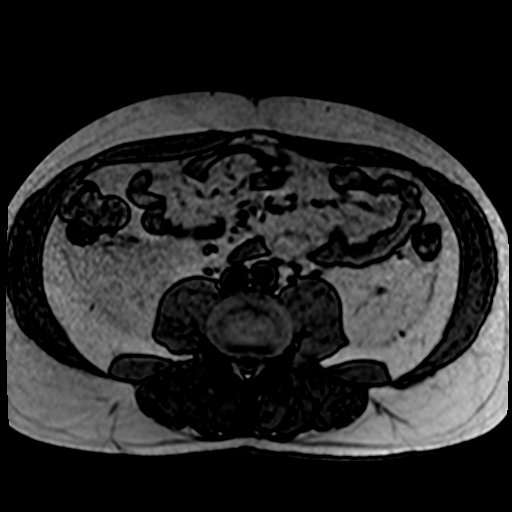
[im 37/74]
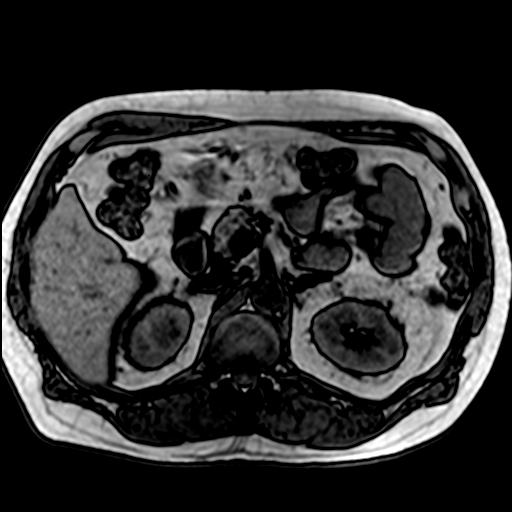
[im 74/74]
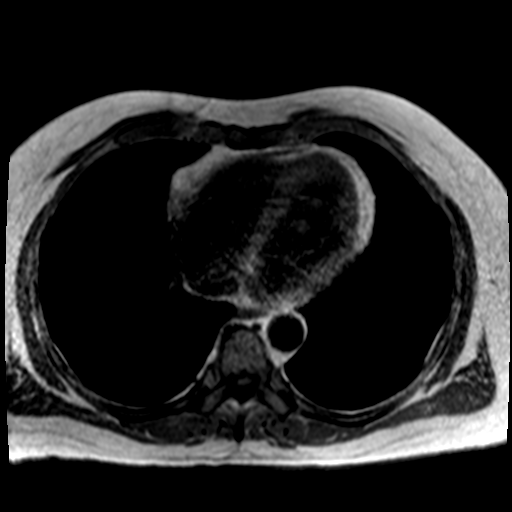

[Series 6: bSSFP · axial · 5.0mm · 0.70mm/px · 1 of 37 slices shown]
[im 1/37]
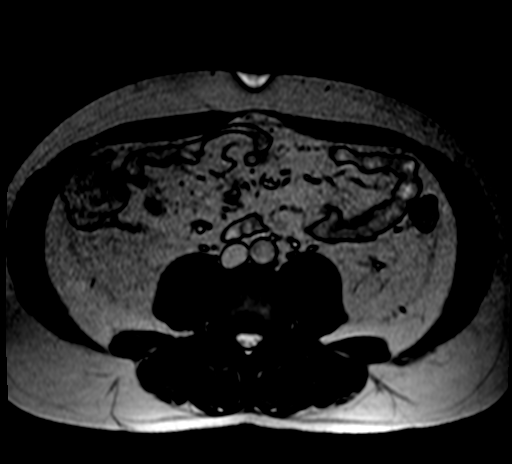

[Series 7: T2 · axial · 5.0mm · 0.94mm/px · 1 of 35 slices shown (1 of 2)]
[im 1/35]
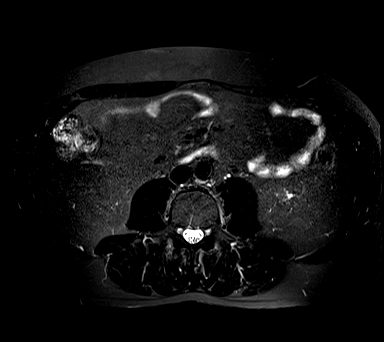

[Series 8: T2 · coronal · 3.0mm · 0.70mm/px · 2 of 48 slices shown (2 of 2)]
[im 1/48]
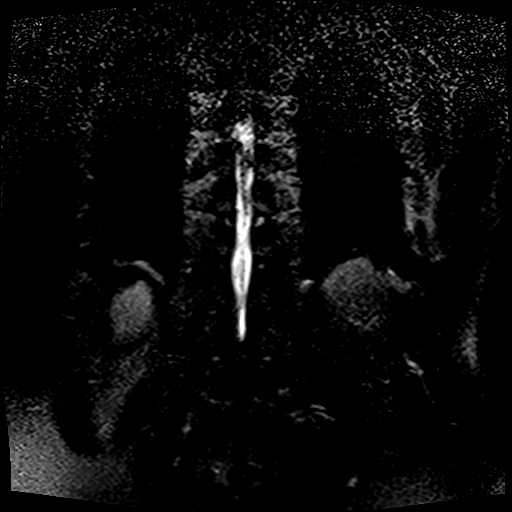
[im 48/48]
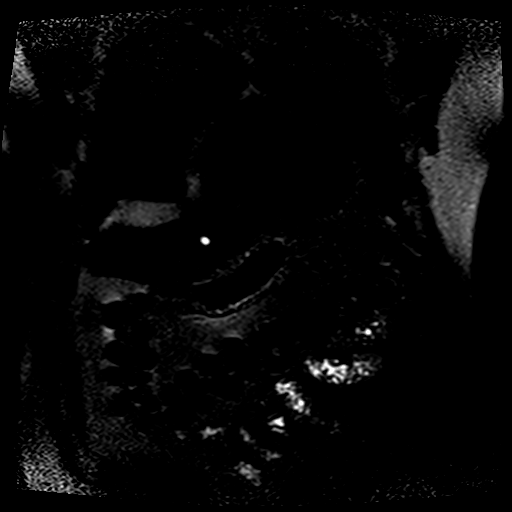

[Series 11: [id number]_[id number]_trig · axial · 5.0mm · 1.88mm/px · z∈[-125,+81]mm · 4 of 102 slices shown]
[im 1/102]
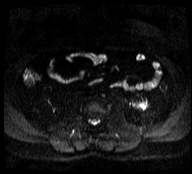
[im 34/102]
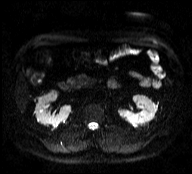
[im 68/102]
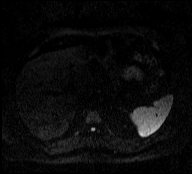
[im 102/102]
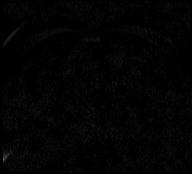

[Series 12: [id number]_trig_adc · axial · 5.0mm · 1.88mm/px · 1 of 34 slices shown]
[im 1/34]
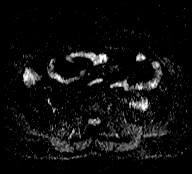

[Series 13: T1 dynamic · axial · non-contrast · 2.5mm · 0.70mm/px · z∈[-142,+56]mm · 3 of 80 slices shown]
[im 1/80]
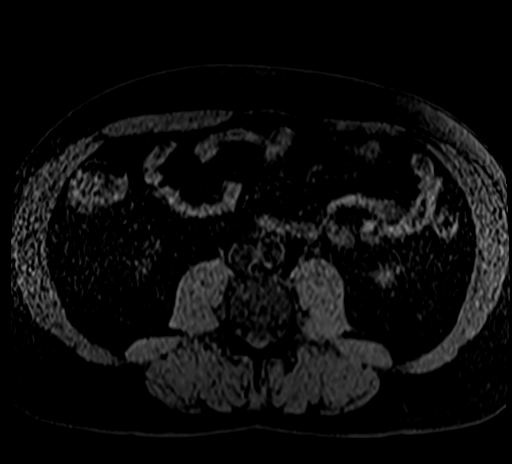
[im 40/80]
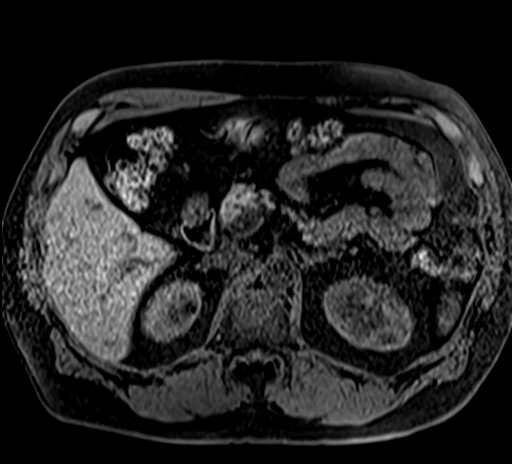
[im 80/80]
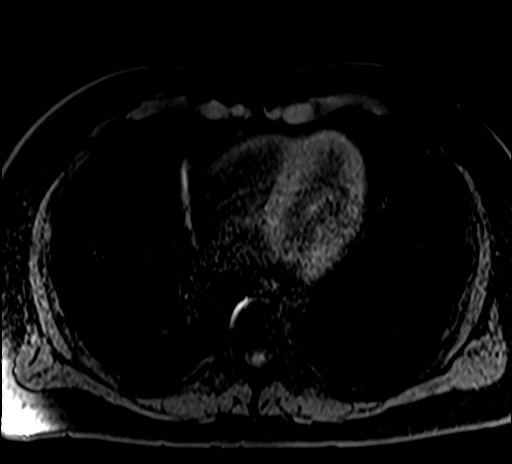

[Series 14: T1 dynamic post-contrast · axial · 2.5mm · 0.70mm/px · z∈[-142,+56]mm · 3 of 80 slices shown (1 of 2)]
[im 1/80]
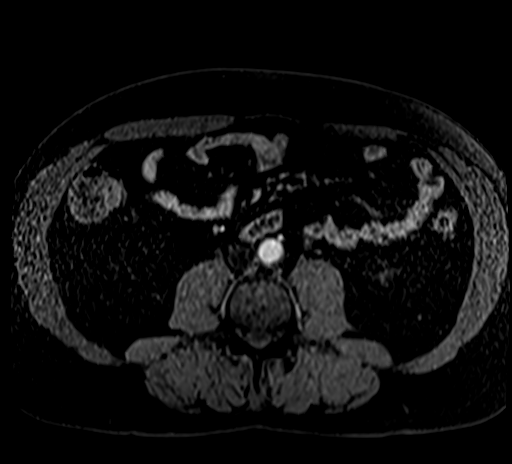
[im 40/80]
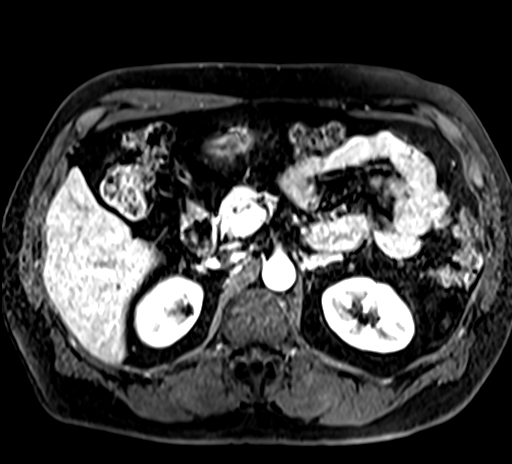
[im 80/80]
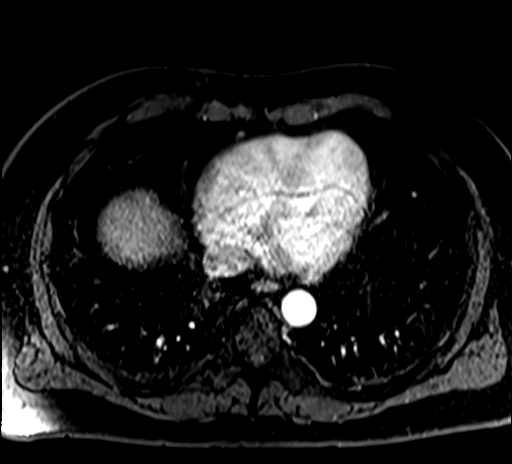

[Series 15: T1 dynamic post-contrast · axial · 2.5mm · 0.70mm/px · z∈[-142,-44]mm · 2 of 80 slices shown (2 of 2)]
[im 1/80]
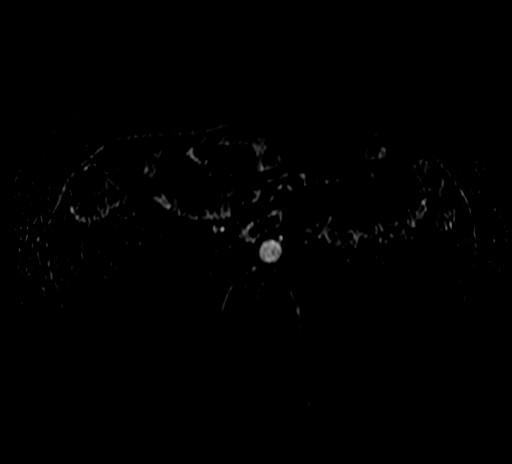
[im 40/80]
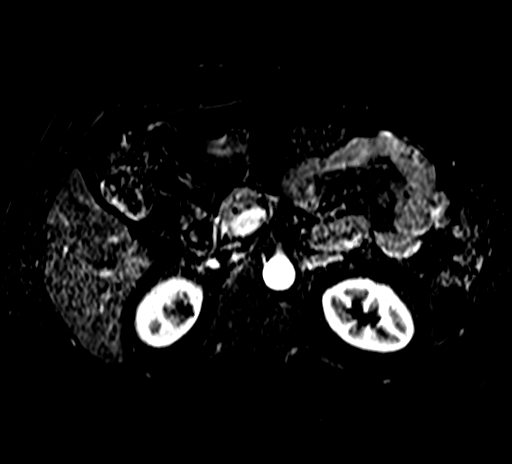

[24 of 48 positions shown; findings below may reference images not displayed]

FINDINGS: Lower chest: No acute findings.

Hepatobiliary: Liver is normal in size and contour. Multiple hepatic
cysts identified which measure up to 1.4 cm in size. Gallbladder is
surgically absent. No biliary ductal dilatation.

Pancreas: 11 mm cystic lesion in the body of the pancreas which does
not definitely demonstrate communication with the pancreatic duct.
There is questionable thin enhancing septation at the inferior
aspect of the cyst, versus volume averaging. Main pancreatic duct is
normal caliber.

Spleen:  Within normal limits in size and appearance.

Adrenals/Urinary Tract: 11 mm right adrenal gland nodule which
appears to demonstrate decreased central signal on out of phase T1
suggesting adenoma. Left adrenal gland is normal. Multiple small
renal cortical cysts identified bilaterally. No hydronephrosis or
enhancing renal mass identified.

Stomach/Bowel: No evidence of bowel obstruction. Colonic
diverticulosis.

Vascular/Lymphatic: No pathologically enlarged lymph nodes
identified. No abdominal aortic aneurysm demonstrated.

Other:  No ascites.

Musculoskeletal: No suspicious bone lesions identified.
IMPRESSION: 1. 11 mm cystic lesion in the body of the pancreas as described.
Recommend follow-up MRI with contrast in 12 months.
2. Multiple hepatic and renal cysts.
3. Colonic diverticulosis.

## 2023-07-23 DIAGNOSIS — I10 Essential (primary) hypertension: Secondary | ICD-10-CM | POA: Diagnosis not present

## 2023-07-23 DIAGNOSIS — E756 Lipid storage disorder, unspecified: Secondary | ICD-10-CM | POA: Diagnosis not present

## 2023-07-23 DIAGNOSIS — M79604 Pain in right leg: Secondary | ICD-10-CM | POA: Diagnosis not present

## 2023-09-30 ENCOUNTER — Other Ambulatory Visit: Payer: Self-pay | Admitting: Cardiology

## 2023-09-30 DIAGNOSIS — I4891 Unspecified atrial fibrillation: Secondary | ICD-10-CM

## 2023-12-10 ENCOUNTER — Emergency Department (HOSPITAL_BASED_OUTPATIENT_CLINIC_OR_DEPARTMENT_OTHER)
Admission: EM | Admit: 2023-12-10 | Discharge: 2023-12-10 | Disposition: A | Discharge status: Home / Self Care | Payer: BC Managed Care – PPO | Attending: Emergency Medicine | Admitting: Emergency Medicine

## 2023-12-10 ENCOUNTER — Other Ambulatory Visit: Payer: Self-pay

## 2023-12-10 DIAGNOSIS — Z20822 Contact with and (suspected) exposure to covid-19: Secondary | ICD-10-CM | POA: Insufficient documentation

## 2023-12-10 DIAGNOSIS — J069 Acute upper respiratory infection, unspecified: Secondary | ICD-10-CM | POA: Insufficient documentation

## 2023-12-10 DIAGNOSIS — D72819 Decreased white blood cell count, unspecified: Secondary | ICD-10-CM | POA: Diagnosis not present

## 2023-12-10 DIAGNOSIS — Z7982 Long term (current) use of aspirin: Secondary | ICD-10-CM | POA: Insufficient documentation

## 2023-12-10 DIAGNOSIS — R531 Weakness: Secondary | ICD-10-CM | POA: Diagnosis not present

## 2023-12-10 DIAGNOSIS — I10 Essential (primary) hypertension: Secondary | ICD-10-CM | POA: Diagnosis not present

## 2023-12-10 DIAGNOSIS — E876 Hypokalemia: Secondary | ICD-10-CM | POA: Insufficient documentation

## 2023-12-10 LAB — URINALYSIS, ROUTINE W REFLEX MICROSCOPIC
Bilirubin Urine: NEGATIVE
Glucose, UA: NEGATIVE mg/dL
Hgb urine dipstick: NEGATIVE
Ketones, ur: NEGATIVE mg/dL
Leukocytes,Ua: NEGATIVE
Nitrite: NEGATIVE
Protein, ur: 30 mg/dL — AB
Specific Gravity, Urine: 1.015 (ref 1.005–1.030)
pH: 7.5 (ref 5.0–8.0)

## 2023-12-10 LAB — BASIC METABOLIC PANEL
Anion gap: 7 (ref 5–15)
BUN: 10 mg/dL (ref 6–20)
CO2: 29 mmol/L (ref 22–32)
Calcium: 9 mg/dL (ref 8.9–10.3)
Chloride: 101 mmol/L (ref 98–111)
Creatinine, Ser: 1.01 mg/dL (ref 0.61–1.24)
GFR, Estimated: >60 mL/min (ref >60)
Glucose, Bld: 105 mg/dL — ABNORMAL HIGH (ref 70–99)
Potassium: 3.1 mmol/L — ABNORMAL LOW (ref 3.5–5.1)
Sodium: 137 mmol/L (ref 135–145)

## 2023-12-10 LAB — URINALYSIS, MICROSCOPIC (REFLEX)
RBC / HPF: NONE SEEN RBC/hpf (ref 0–5)
WBC, UA: NONE SEEN WBC/hpf (ref 0–5)

## 2023-12-10 LAB — RESP PANEL BY RT-PCR (RSV, FLU A&B, COVID)  RVPGX2
Influenza A by PCR: NEGATIVE
Influenza B by PCR: NEGATIVE
Resp Syncytial Virus by PCR: NEGATIVE
SARS Coronavirus 2 by RT PCR: NEGATIVE

## 2023-12-10 LAB — CBC
HCT: 41.4 % (ref 39.0–52.0)
Hemoglobin: 14.3 g/dL (ref 13.0–17.0)
MCH: 29.2 pg (ref 26.0–34.0)
MCHC: 34.5 g/dL (ref 30.0–36.0)
MCV: 84.7 fL (ref 80.0–100.0)
Platelets: 196 10*3/uL (ref 150–400)
RBC: 4.89 MIL/uL (ref 4.22–5.81)
RDW: 11.9 % (ref 11.5–15.5)
WBC: 3.3 10*3/uL — ABNORMAL LOW (ref 4.0–10.5)
nRBC: 0.6 % — ABNORMAL HIGH (ref 0.0–0.2)

## 2023-12-10 MED ORDER — POTASSIUM CHLORIDE CRYS ER 20 MEQ PO TBCR
40.0000 meq | EXTENDED_RELEASE_TABLET | Freq: Once | ORAL | Status: AC
  Administered 2023-12-10: 40 meq via ORAL
  Filled 2023-12-10: qty 2

## 2023-12-10 NOTE — Discharge Instructions (Addendum)
Today you are seen for upper respiratory infection.  Please take Tylenol/Motrin as needed for pain and fever, Flonase for nasal congestion, and plain Mucinex for chest congestion.  Please follow-up with your primary care if your symptoms persist.  Thank you for letting us treat you today. After performing physical exam and reviewing your labs, I feel you are safe to go home. Please follow up with your PCP in the next several days and provide them with your records from this visit. Return to the Emergency Room if pain becomes severe or symptoms worsen.

## 2023-12-10 NOTE — ED Provider Notes (Signed)
[facility] [place] [facility]MENT AT [facility] Provider Note   CSN: [id number] Arrival date & time: 12/10/23  1439     History  Chief Complaint  Patient presents with   Weakness    Kevin Holmes is a 58 y.o. male past medical history significant for A-fib presents today for fatigue.  Patient states he believes he and his wife had the flu since Thursday but tested negative with an at home test.  Today is the first day that the patient attempted to go back to work and noticed he was very fatigued.  Patient also endorses chest congestion, congestion, body aches, and chills.  Patient did have a fever that resolved on Monday. He has a history of low potassium and states that this feels similar.  Patient states that he takes potassium supplements daily.   Weakness Associated symptoms: myalgias        Home Medications Prior to Admission medications   Medication Sig Start Date End Date Taking? Authorizing Provider  aspirin EC 81 MG tablet Take 1 tablet (81 mg total) by mouth daily. 04/22/13   Allred, Fayrene Fearing, MD  cholecalciferol (VITAMIN D3) 25 MCG (1000 UNIT) tablet Take 1,000 Units by mouth daily.    [provider]  diltiazem (TIAZAC) 360 MG 24 hr capsule TAKE 1 CAPSULE DAILY 10/01/23   Lewayne Bunting, MD  EPINEPHrine 0.3 mg/0.3 mL IJ SOAJ injection SMARTSIG:IM As Directed PRN 04/02/22   [provider]  furosemide (LASIX) 20 MG tablet Take 20 mg by mouth as needed.    [provider]  pantoprazole (PROTONIX) 20 MG tablet Take 1 tablet (20 mg total) by mouth daily. 03/04/23   Dyann Kief, PA-C  Potassium Citrate 15 MEQ (1620 MG) TBCR Take 4 tablets by mouth daily. 12/03/18   [provider]  rosuvastatin (CRESTOR) 20 MG tablet Take 20 mg by mouth daily. 05/11/22   [provider]  valsartan (DIOVAN) 40 MG tablet TAKE ONE TABLET ONCE DAILY 05/28/23   Lewayne Bunting, MD  vitamin B-12 (CYANOCOBALAMIN) 1000 MCG tablet Take 1,000  mcg by mouth daily.    [provider]      Allergies    Other    Review of Systems   Review of Systems  Constitutional:  Positive for chills and fatigue.  HENT:  Positive for congestion.   Musculoskeletal:  Positive for myalgias.    Physical Exam Updated Vital Signs BP (!) 160/93 (BP Location: Left Arm)   Pulse 71   Temp 98.2 F (36.8 C) (Oral)   Resp 16   Ht 5\' 10"  (1.778 m)   Wt 95.3 kg   SpO2 99%   BMI 30.13 kg/m  Physical Exam Vitals and nursing note reviewed.  Constitutional:      General: He is not in acute distress.    Appearance: Normal appearance. He is well-developed. He is not ill-appearing.  HENT:     Head: Normocephalic and atraumatic.     Right Ear: Tympanic membrane and external ear normal.     Left Ear: Tympanic membrane and external ear normal.     Nose: Congestion present.     Mouth/Throat:     Mouth: Mucous membranes are moist.     Pharynx: Oropharynx is clear. No oropharyngeal exudate or posterior oropharyngeal erythema.  Eyes:     Extraocular Movements: Extraocular movements intact.     Conjunctiva/sclera: Conjunctivae normal.  Cardiovascular:     Rate and Rhythm: Normal rate and regular rhythm.  Heart sounds: Normal heart sounds. No murmur heard. Pulmonary:     Effort: Pulmonary effort is normal. No respiratory distress.     Breath sounds: Normal breath sounds. No wheezing.  Abdominal:     Palpations: Abdomen is soft.     Tenderness: There is no abdominal tenderness.  Musculoskeletal:        General: No swelling. Normal range of motion.     Cervical back: Normal range of motion and neck supple.  Skin:    General: Skin is warm and dry.     Capillary Refill: Capillary refill takes less than 2 seconds.  Neurological:     General: No focal deficit present.     Mental Status: He is alert.     Motor: No weakness.  Psychiatric:        Mood and Affect: Mood normal.     ED Results / Procedures / Treatments   Labs (all labs  ordered are listed, but only abnormal results are displayed) Labs Reviewed  BASIC METABOLIC PANEL - Abnormal; Notable for the following components:      Result Value   Potassium 3.1 (*)    Glucose, Bld 105 (*)    All other components within normal limits  CBC - Abnormal; Notable for the following components:   WBC 3.3 (*)    nRBC 0.6 (*)    All other components within normal limits  URINALYSIS, ROUTINE W REFLEX MICROSCOPIC - Abnormal; Notable for the following components:   Protein, ur 30 (*)    All other components within normal limits  URINALYSIS, MICROSCOPIC (REFLEX) - Abnormal; Notable for the following components:   Bacteria, UA RARE (*)    All other components within normal limits  RESP PANEL BY RT-PCR (RSV, FLU A&B, COVID)  [id number]    EKG EKG Interpretation Date/Time:  Wednesday December 10 2023 14:51:28 EST Ventricular Rate:  77 PR Interval:  176 QRS Duration:  100 QT Interval:  416 QTC Calculation: 471 R Axis:   21  Text Interpretation: Sinus rhythm No significant change since prior 3/24 Confirmed by Meridee Score [phone]) on 12/10/2023 2:53:58 PM  Radiology No results found.  Procedures Procedures    Medications Ordered in ED Medications  potassium chloride SA (KLOR-CON M) CR tablet 40 mEq (40 mEq Oral Given 12/10/23 1714)    ED Course/ Medical Decision Making/ A&P                                 Medical Decision Making Amount and/or Complexity of Data Reviewed Labs: ordered.   This patient presents to the ED with chief complaint(s) of fatigue with pertinent past medical history of influenza and A-fib which further complicates the presenting complaint. The complaint involves an extensive differential diagnosis and also carries with it a high risk of complications and morbidity.    The differential diagnosis includes arrhythmia, hypokalemia, electrolyte abnormality, influenza infection, UTI, URI  Additional history obtained: Records reviewed Care  Everywhere/External Records  ED Course and Reassessment:   Independent labs interpretation:  The following labs were independently interpreted:  CBC: Mild leukopenia 3.3 BMP: Mild hypokalemia at 3.1 UA: 30 protein EKG: Sinus rhythm Respiratory panel: Negative  Consultation: - Consulted or discussed management/test interpretation w/ external professional: None  Consideration for admission or further workup: Considered for mission further workup however patient's vital signs, physical exam, and labs were reassuring.  Patient was likely due to upper respiratory infection.  Patient should continue taking over-the-counter medications to include Tylenol/Motrin for body aches and fever, Flonase for nasal congestion, and Mucinex for chest congestion.  Patient should follow-up with primary care physician if his symptoms persist.        Final Clinical Impression(s) / ED Diagnoses Final diagnoses:  Upper respiratory tract infection, unspecified type    Rx / DC Orders ED Discharge Orders     None         Dolphus Jenny, PA-C 12/10/23 1802    Royanne Foots, DO 12/15/23 [phone]

## 2023-12-10 NOTE — ED Triage Notes (Signed)
States had the flu last week and is not getting better. States symptoms feels similar to when K+ is low. Takes K+ supplements daily. Denies any CP or SHOB.

## 2023-12-24 NOTE — Progress Notes (Unsigned)
Cardiology Clinic Note   Patient Name: Kevin Holmes Date of Encounter: 12/25/2023  Primary Care Provider:  Rebekah Chesterfield, NP Primary Cardiologist:  Olga Millers, MD  Patient Profile    Kevin Holmes 58 year old male presents the clinic for follow-up follow-up evaluation of his chest discomfort and palpitations.  Past Medical History    Past Medical History:  Diagnosis Date   History of gallstones    LAPAROSCOPIC CHOLECYSTECTOMY 2005   Hypertension    Paroxysmal atrial fibrillation (HCC)    Pure hypercholesterolemia    Sleep apnea    Past Surgical History:  Procedure Laterality Date   ATRIAL FIBRILLATION ABLATION N/A 10/20/2012   Procedure: ATRIAL FIBRILLATION ABLATION;  Surgeon: Hillis Range, MD;  Location: [facility] CATH LAB;  Service: Cardiovascular;  Laterality: N/A;   EXTRACORPOREAL SHOCK WAVE LITHOTRIPSY Left 11/26/2021   Procedure: EXTRACORPOREAL SHOCK WAVE LITHOTRIPSY (ESWL);  Surgeon: Sebastian Ache, MD;  Location[facility];  Service: Urology;  Laterality: Left;   LAMINECTOMY     LAPAROSCOPIC CHOLECYSTECTOMY  07/04/2004 [facility]   DR. PAUL TOTH   TEE WITHOUT CARDIOVERSION  10/19/2012   Procedure: TRANSESOPHAGEAL ECHOCARDIOGRAM (TEE);  Surgeon: Pricilla Riffle, MD;  Location: [facility] ENDOSCOPY;  Service: Cardiovascular;  Laterality: N/A;    Allergies  Allergies  Allergen Reactions   Other Other (See Comments)    Meat allergy    History of Present Illness    Kevin Holmes is a 58 y.o. male with a hx of hypertension, atrial fibrillation, OSA, palpitations, hyperlipidemia, fatigue, and left arm pain.  He underwent ablation in 2018.  His anticoagulation was stopped after that.  Since that time he has had only occasional episodes of palpitations that were nonsustained.  He was noted to have fatigue while on beta blocking medications and was placed on diltiazem.  He was also placed on Aldactone which was stopped secondary to breast discomfort.  He was  last seen by Susa Raring on 08/10/2020.  He reported he was in the process of being evaluated for multiple sclerosis by neurology.   He presented to the emergency department on 06/16/2021 complaints of palpitations and chest discomfort.  He reported that his chest discomfort was on his right side.  He also noted increased palpitations over the past few weeks.  He presented to his PCP who ordered blood work.  He was noted to have an elevated D-dimer and was sent to the emergency department for further evaluation.  He denied shortness of breath.  He denied exertional chest discomfort.  His blood pressure at that time was 130/83 with a pulse of 60.  His EKG showed normal sinus rhythm 81 bpm.  His CT chest showed no evidence of PE.  Doppler ultrasound of his lower extremity showed no evidence of DVT.  His lab work was unremarkable and his troponins were negative.  It was recommended that he have a cardiac event monitor or echocardiogram to evaluate his symptoms associated with his lower extremity swelling and fatigue.  He was discharged in stable condition on 06/16/2021.   He presented to the clinic 06/19/2021 for follow-up evaluation stated he had fatigue for the last 5 years.  We reviewed his labs and diagnostics from the emergency department.  His vitamin D was on the low side of normal.  He has Alpha-gal syndrome from a tick bite.  He does not eat as much meat.  He reported compliance with his CPAP.  He had noticed some increased lower extremity swelling  had been placed on furosemide as needed by his PCP.  He was also taking supplemental potassium.  I will ordered a vitamin B12 lab, repeated his echocardiogram, gave him a salty 6 diet sheet, gave the [place] support stocking sheet, and planned follow-up in 3 months.  I  asked him to start back with his vitamin D supplementation as well.  His echocardiogram 07/06/2021 showed LVEF 60-65% and no significant valvular abnormalities.   He presented the clinic for  follow-up evaluation stated he stopped taking his furosemide.  He noticed that after he stopped taking the medication this palpitations significantly reduced.  He continued to take his potassium for hypokalemia.  We reviewed his echocardiogram and his medication regimen.  He reported that he was recently traveling and  had some nasal congestion.  He was not very physically active.   During that time he was not using his CPAP.  He felt that his blood pressure was elevated due to not using CPAP.  We discussed stopping losartan and starting valsartan medication, maintaining a blood pressure log and planned to have a BMP in 1 week.  Follow-up in 3 months was planned.  I have asked him to increase his physical activity as tolerated.  He was seen by Dr. Jens Som on 01/04/2022.  During that time he described more dyspnea with more extreme activities but not with routine activities.  His breathing returned to baseline at rest.  He denied chest pain.  He denied orthopnea PND and lower extremity swelling.  He denied exertional chest discomfort.  He contacted the nurse triage line on 05/10/2022.  During that time he reported having chest discomfort more frequently over the last couple months.  He described the discomfort as being off and on in his arms and neck.  He reported that he had seen his PCP and had an EKG.  His PCP felt that his discomfort was related to a pinched nerve.  He presented in clinic 05/16/22 for follow-up evaluation stated he had noticed episodes of right arm pain and chest discomfort.  He had completed a 2500 mile motorcycle trip.  He then had a benefit ride shortly after and noticed increased pain along his right arm.  He  noticed that when he would lay flat at night.  He presented to his PCP who has ordered a MRI study of his neck.  We reviewed his previous CT scans and recent cholesterol.  His discomfort appeared to be atypical, MSK, versus reflux type pain.  He reported that he did not have a formal  exercise routine.  I  asked him to increase his physical activity, continue to refrain from tobacco use, and  planned to continue his  medical therapy.   follow-up in 9 to 12 months was planned.  He was seen in follow-up by Christen Bame, PA-C on 03/04/2023.  He had been in the emergency department with chest pain on 02/07/2023.  It was relieved with Maalox and famotidine.  At the time of his cardiology visit he reported having sharp pain on the right side of his chest that would come and go at any time.  He continued to have cervical neck problems with pain that would go down his left arm.  He had pain with shoulder movements.  He denied chest pressure, dyspnea, palpitations, and lower extremity swelling.  He was somewhat physically active doing yard work but did not have formal exercise routine.  He reported reflux type symptoms and pain after he would eat.  He  was not taking his omeprazole regularly.  He reported liking to eat hot spicy food.  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  He was started on Protonix daily.  He was encouraged to follow-up with neurosurgery.  He presents to the clinic today for follow-up evaluation and states he has been doing fairly well.  He has not been exercising as much since summer.  He exercises more when the weather is nice.  He reports that he notices his blood pressure is better when he is using his CPAP.  His blood pressure today initially is 162/92 and on recheck it is 148/82.  He is limited in his physical activity somewhat due to his spinal pain.  He did have some success losing weight with intermittent fasting.  I will increase his valsartan to 80 mg daily, order BMP in 1 week and plan follow-up in 3 months.  I will give him the salty 6 diet sheet and have him increase his physical activity as tolerated.  Today he denies chest pain, shortness of breath, lower extremity edema, fatigue, palpitations, melena,  hematuria, hemoptysis, diaphoresis, weakness, presyncope, syncope, orthopnea, and PND.     Home Medications    Prior to Admission medications   Medication Sig Start Date End Date Taking? Authorizing Provider  aspirin EC 81 MG tablet Take 1 tablet (81 mg total) by mouth daily. 04/22/13   Allred, Fayrene Fearing, MD  Cyanocobalamin (B-12 PO) Take 1,000 mcg by mouth daily.    [provider]  diltiazem (TIAZAC) 360 MG 24 hr capsule TAKE 1 CAPSULE DAILY 10/17/20   Lewayne Bunting, MD  furosemide (LASIX) 20 MG tablet Take 20 mg by mouth.    [provider]  losartan (COZAAR) 50 MG tablet TAKE ONE TABLET AT BEDTIME 08/22/21   Lewayne Bunting, MD  Potassium Citrate 15 MEQ (1620 MG) TBCR Take 4 tablets by mouth daily. 12/03/18   [provider]  rosuvastatin (CRESTOR) 20 MG tablet Take 20 mg by mouth at bedtime. 01/11/20   [provider]  VITAMIN D PO Take 2 tablets by mouth daily.    [provider]    Family History    Family History  Problem Relation Age of Onset   Hypertension Father    Heart attack Paternal Grandfather 44   Breast cancer Paternal Grandmother 13   He indicated that his mother is alive. He indicated that his father is alive. He indicated that the status of his paternal grandmother is unknown. He indicated that his paternal grandfather is deceased.   Social History    Social History   Socioeconomic History   Marital status: Married    Spouse name: Not on file   Number of children: Not on file   Years of education: Not on file   Highest education level: Not on file  Occupational History   Occupation: [employer]  Tobacco Use   Smoking status: Never   Smokeless tobacco: Never  Substance and Sexual Activity   Alcohol use: Yes    Alcohol/week: 0.0 standard drinks of alcohol    Comment: rare   Drug use: No   Sexual activity: Not on file  Other Topics Concern   Not on file  Social History Narrative   Lives in [place].  Works at  [employer] as an Art gallery manager   Caffeine use: 1 per day   Right handed    Social Drivers of Health  Financial Resource Strain: Not on file  Food Insecurity: Not on file  Transportation Needs: Not on file  Physical Activity: Not on file  Stress: Not on file  Social Connections: Not on file  Intimate Partner Violence: Not on file     Review of Systems    General:  No chills, fever, night sweats or weight changes.  Cardiovascular:  No chest pain, dyspnea on exertion, edema, orthopnea, palpitations, paroxysmal nocturnal dyspnea. Dermatological: No rash, lesions/masses Respiratory: No cough, dyspnea Urologic: No hematuria, dysuria Abdominal:   No nausea, vomiting, diarrhea, bright red blood per rectum, melena, or hematemesis Neurologic:  No visual changes, wkns, changes in mental status. All other systems reviewed and are otherwise negative except as noted above.  Physical Exam    VS:  BP (!) 148/82   Pulse 71   Ht 5\' 10"  (1.778 m)   Wt 219 lb 12.8 oz (99.7 kg)   SpO2 98%   BMI 31.54 kg/m  , BMI Body mass index is 31.54 kg/m. GEN: Well nourished, well developed, in no acute distress. HEENT: normal. Neck: Supple, no JVD, carotid bruits, or masses. Cardiac: RRR, no murmurs, rubs, or gallops. No clubbing, cyanosis, no edema.  Radials/DP/PT 2+ and equal bilaterally.  Respiratory:  Respirations regular and unlabored, clear to auscultation bilaterally. GI: Soft, nontender, nondistended, BS + x 4. MS: no deformity or atrophy. Skin: warm and dry, no rash. Neuro:  Strength and sensation are intact. Psych: Normal affect.  Accessory Clinical Findings    Recent Labs: 02/07/2023: ALT 38 12/10/2023: BUN 10; Creatinine, Ser 1.01; Hemoglobin 14.3; Platelets 196; Potassium 3.1; Sodium 137   Recent Lipid Panel    Component Value Date/Time   CHOL 162 04/23/2019 1005   TRIG 126 04/23/2019 1005   HDL 44 04/23/2019 1005   CHOLHDL 3.7 04/23/2019 1005   CHOLHDL 4 09/16/2014 1003   VLDL 13.8  09/16/2014 1003   LDLCALC 93 04/23/2019 1005    ECG personally reviewed by me today-none today.  EKG 05/16/2022 normal sinus rhythm 78 bpm no ST or T wave deviation  Myoview 02/01/2020- Nuclear stress EF: 60%. No wall motion abnormalities There was no ST segment deviation noted during stress. The study is normal. This is a low risk study. No evidence of ischemia or infarction.  Echocardiogram 07/06/2021  IMPRESSIONS     1. Left ventricular ejection fraction, by estimation, is 60 to 65%. The  left ventricle has normal function. The left ventricle has no regional  wall motion abnormalities. The left ventricular internal cavity size was  moderately dilated. Left ventricular  diastolic parameters were normal.   2. Right ventricular systolic function is normal. The right ventricular  size is normal.   3. Global longitudinal strain is -24.6%.   4. The mitral valve is normal in structure. Trivial mitral valve  regurgitation.   5. The aortic valve is tricuspid. Aortic valve regurgitation is not  visualized. Mild aortic valve sclerosis is present, with no evidence of  aortic valve stenosis.   6. Aortic dilatation noted.   Comparison(s): The left ventricular function is unchanged.  Cardiac event monitor 08/07/2021 Patch Wear Time:  10 days and 22 hours (2022-09-11T22:27:06-399 to 2022-09-22T20:51:30-0400)   Patient had a min HR of 51 bpm, max HR of 158 bpm, and avg HR of 72 bpm. Predominant underlying rhythm was Sinus Rhythm. 10 Supraventricular Tachycardia runs occurred, the run with the fastest interval lasting 12 beats with a max rate of 158 bpm, the  longest lasting 11 beats  with an avg rate of 97 bpm. Supraventricular Tachycardia was detected within +/- 45 seconds of symptomatic patient event(s). Isolated SVEs were rare (<1.0%), SVE Couplets were rare (<1.0%), and SVE Triplets were rare (<1.0%).  Isolated VEs were rare (<1.0%), VE Couplets were rare (<1.0%), and no VE Triplets were  present. Ventricular Trigeminy was present.    Sinus bradycardia, NSR, sinus tachycardia, occasional pac, brief PAT, rare PVC and couplet Olga Millers  Cardiac CT 11/13 Calcium score 0  Assessment & Plan   1. Paroxysmal atrial fibrillation-HR today 71 .  He had ablation 2018.  Cardiac event monitor 08/07/2021 showed no significant arrhythmias.  His anticoagulation stopped after ablation procedure.  He denies recent episodes of accelerated heart rate or irregular heartbeats.  Does occasionally note brief episodes of flutter that last for seconds and dissipate. Continue diltiazem Continue furosemide as needed, potassium as needed Lower extremity support stockings-reviewed   Essential hypertension-BP today 148/82.  Reports higher blood pressures at home. Maintain blood pressure log.   Continue  diltiazem,  Increase valsartan 80 mg Increase physical activity-goal 150 minutes of moderate physical activity per week BMP in 1 week  Hyperlipidemia-LDL 93 on 04/23/19.  Continue rosuvastatin High-fiber diet Labs with PCP   Chest discomfort-continues with occasional intermittent episodes of chest discomfort that appears to be related to neurologic pain.  Previous discomfort felt to be early to pinched cervical nerve and an MRI confirmed C-spine issues.  Also has GERD which appears to be contributing.  Continue heart healthy low-sodium diet-GERD diet instructions given Following with neurosurgery  Disposition: Follow-up with Dr. Jens Som or me in 3 months.  Thomasene Ripple. Nelli Swalley NP-C     12/25/2023, 8:23 AM [facility] [address] Office [phone] Fax [phone]  Notice: This dictation was prepared with Dragon dictation along with smaller phrase technology. Any transcriptional errors that result from this process are unintentional and may not be corrected upon review.  I spent 14 minutes examining this patient, reviewing medications, and using  patient centered shared decision making involving  cardiac care.  I spent  20 minutes reviewing past medical history,  medications, and prior cardiac tests.

## 2023-12-25 ENCOUNTER — Encounter: Payer: Self-pay | Admitting: General Practice

## 2023-12-25 ENCOUNTER — Ambulatory Visit: Payer: BC Managed Care – PPO | Attending: General Practice | Admitting: General Practice

## 2023-12-25 VITALS — BP 148/82 | HR 71 | Ht 70.0 in | Wt 219.8 lb

## 2023-12-25 DIAGNOSIS — E7800 Pure hypercholesterolemia, unspecified: Secondary | ICD-10-CM | POA: Diagnosis not present

## 2023-12-25 DIAGNOSIS — I10 Essential (primary) hypertension: Secondary | ICD-10-CM

## 2023-12-25 DIAGNOSIS — Z87898 Personal history of other specified conditions: Secondary | ICD-10-CM

## 2023-12-25 DIAGNOSIS — I480 Paroxysmal atrial fibrillation: Secondary | ICD-10-CM

## 2023-12-25 MED ORDER — VALSARTAN 80 MG PO TABS: 80.0000 mg | ORAL_TABLET | Freq: Every day | ORAL | 3 refills | Status: DC

## 2023-12-25 NOTE — Patient Instructions (Signed)
Medication Instructions:  INCREASE YOUR VALSARTAN 80MG  DAILY *If you need a refill on your cardiac medications before your next appointment, please call your pharmacy*  Lab Work: BMET IN 1 WEEK If you have labs (blood work) drawn today and your tests are completely normal, you will receive your results only by:  MyChart Message (if you have MyChart) OR A paper copy in the mail If you have any lab test that is abnormal or we need to change your treatment, we will call you to review the results.  Other Instructions INCREASE PHYSICAL ACTIVITY-AS TOLERATED PLEASE READ AND FOLLOW ATTACHED  SALTY 6  Follow-Up: At [facility], you and your health needs are our priority.  As part of our continuing mission to provide you with exceptional heart care, we have created designated Provider Care Teams.  These Care Teams include your primary Cardiologist (physician) and Advanced Practice Providers (APPs -  Physician Assistants and Nurse Practitioners) who all work together to provide you with the care you need, when you need it.  Your next appointment:   3 month(s)  Provider:   Olga Millers, MD  or Edd Fabian, FNP

## 2023-12-29 ENCOUNTER — Other Ambulatory Visit: Payer: Self-pay | Admitting: Cardiology

## 2023-12-29 DIAGNOSIS — I4891 Unspecified atrial fibrillation: Secondary | ICD-10-CM

## 2024-01-02 ENCOUNTER — Other Ambulatory Visit (HOSPITAL_BASED_OUTPATIENT_CLINIC_OR_DEPARTMENT_OTHER): Payer: Self-pay | Admitting: General Practice

## 2024-01-02 DIAGNOSIS — I10 Essential (primary) hypertension: Secondary | ICD-10-CM | POA: Diagnosis not present

## 2024-01-02 DIAGNOSIS — Z87898 Personal history of other specified conditions: Secondary | ICD-10-CM | POA: Diagnosis not present

## 2024-01-02 DIAGNOSIS — E7800 Pure hypercholesterolemia, unspecified: Secondary | ICD-10-CM | POA: Diagnosis not present

## 2024-01-02 DIAGNOSIS — I480 Paroxysmal atrial fibrillation: Secondary | ICD-10-CM | POA: Diagnosis not present

## 2024-01-03 LAB — BASIC METABOLIC PANEL
BUN/Creatinine Ratio: 16 (ref 9–20)
BUN: 16 mg/dL (ref 6–24)
CO2: 28 mmol/L (ref 20–29)
Calcium: 9.5 mg/dL (ref 8.7–10.2)
Chloride: 103 mmol/L (ref 96–106)
Creatinine, Ser: 1 mg/dL (ref 0.76–1.27)
Glucose: 100 mg/dL — ABNORMAL HIGH (ref 70–99)
Potassium: 3.7 mmol/L (ref 3.5–5.2)
Sodium: 145 mmol/L — ABNORMAL HIGH (ref 134–144)
eGFR: 87 mL/min/{1.73_m2} (ref >59)

## 2024-02-23 ENCOUNTER — Other Ambulatory Visit: Payer: Self-pay | Admitting: Physician Assistant

## 2024-03-31 NOTE — Progress Notes (Signed)
 Cardiology Clinic Note   Patient Name: Kevin CIAVARELLA Date of Encounter: 04/02/2024  Primary Care Provider:  Lenn Quint, NP Primary Cardiologist:  Alexandria Angel, MD  Patient Profile    Kevin Holmes 58 year old male presents the clinic for follow-up follow-up evaluation of his chest discomfort and palpitations.  Past Medical History    Past Medical History:  Diagnosis Date   History of gallstones    LAPAROSCOPIC CHOLECYSTECTOMY 2005   Hypertension    Paroxysmal atrial fibrillation (HCC)    Pure hypercholesterolemia    Sleep apnea    Past Surgical History:  Procedure Laterality Date   ATRIAL FIBRILLATION ABLATION N/A 10/20/2012   Procedure: ATRIAL FIBRILLATION ABLATION;  Surgeon: Jolly Needle, MD;  Location: [facility] CATH LAB;  Service: Cardiovascular;  Laterality: N/A;   EXTRACORPOREAL SHOCK WAVE LITHOTRIPSY Left 11/26/2021   Procedure: EXTRACORPOREAL SHOCK WAVE LITHOTRIPSY (ESWL);  Surgeon: Osborn Blaze, MD;  Location: [facility];  Service: Urology;  Laterality: Left;   LAMINECTOMY     LAPAROSCOPIC CHOLECYSTECTOMY  07/04/2004 [facility]   DR. PAUL TOTH   TEE WITHOUT CARDIOVERSION  10/19/2012   Procedure: TRANSESOPHAGEAL ECHOCARDIOGRAM (TEE);  Surgeon: Elmyra Haggard, MD;  Location: [facility] ENDOSCOPY;  Service: Cardiovascular;  Laterality: N/A;    Allergies  Allergies  Allergen Reactions   Alpha-Gal Anaphylaxis, Diarrhea, Hives and Nausea And Vomiting   Other Other (See Comments)    Meat allergy    History of Present Illness    Kevin Holmes is a 58 y.o. male with a hx of hypertension, atrial fibrillation, OSA, palpitations, hyperlipidemia, fatigue, and left arm pain.  He underwent ablation in 2018.  His anticoagulation was stopped after that.  Since that time he has had only occasional episodes of palpitations that were nonsustained.  He was noted to have fatigue while on beta blocking medications and was placed on diltiazem .  He was also placed on  Aldactone  which was stopped secondary to breast discomfort.  He was last seen by Beuford Bruns on 08/10/2020.  He reported he was in the process of being evaluated for multiple sclerosis by neurology.   He presented to the emergency department on 06/16/2021 complaints of palpitations and chest discomfort.  He reported that his chest discomfort was on his right side.  He also noted increased palpitations over the past few weeks.  He presented to his PCP who ordered blood work.  He was noted to have an elevated D-dimer and was sent to the emergency department for further evaluation.  He denied shortness of breath.  He denied exertional chest discomfort.  His blood pressure at that time was 130/83 with a pulse of 60.  His EKG showed normal sinus rhythm 81 bpm.  His CT chest showed no evidence of PE.  Doppler ultrasound of his lower extremity showed no evidence of DVT.  His lab work was unremarkable and his troponins were negative.  It was recommended that he have a cardiac event monitor or echocardiogram to evaluate his symptoms associated with his lower extremity swelling and fatigue.  He was discharged in stable condition on 06/16/2021.   He presented to the clinic 06/19/2021 for follow-up evaluation stated he had fatigue for the last 5 years.  We reviewed his labs and diagnostics from the emergency department.  His vitamin D  was on the low side of normal.  He has Alpha-gal syndrome from a tick bite.  He does not eat as much meat.  He reported compliance with his  CPAP.  He had noticed some increased lower extremity swelling had been placed on furosemide as needed by his PCP.  He was also taking supplemental potassium.  I will ordered a vitamin B12 lab, repeated his echocardiogram, gave him a salty 6 diet sheet, gave the [place] support stocking sheet, and planned follow-up in 3 months.  I  asked him to start back with his vitamin D  supplementation as well.  His echocardiogram 07/06/2021 showed LVEF 60-65% and no  significant valvular abnormalities.   He presented the clinic for follow-up evaluation stated he stopped taking his furosemide.  He noticed that after he stopped taking the medication this palpitations significantly reduced.  He continued to take his potassium for hypokalemia.  We reviewed his echocardiogram and his medication regimen.  He reported that he was recently traveling and  had some nasal congestion.  He was not very physically active.   During that time he was not using his CPAP.  He felt that his blood pressure was elevated due to not using CPAP.  We discussed stopping losartan  and starting valsartan  medication, maintaining a blood pressure log and planned to have a BMP in 1 week.  Follow-up in 3 months was planned.  I have asked him to increase his physical activity as tolerated.  He was seen by Dr. Audery Blazing on 01/04/2022.  During that time he described more dyspnea with more extreme activities but not with routine activities.  His breathing returned to baseline at rest.  He denied chest pain.  He denied orthopnea PND and lower extremity swelling.  He denied exertional chest discomfort.  He contacted the nurse triage line on 05/10/2022.  During that time he reported having chest discomfort more frequently over the last couple months.  He described the discomfort as being off and on in his arms and neck.  He reported that he had seen his PCP and had an EKG.  His PCP felt that his discomfort was related to a pinched nerve.  He presented in clinic 05/16/22 for follow-up evaluation stated he had noticed episodes of right arm pain and chest discomfort.  He had completed a 2500 mile motorcycle trip.  He then had a benefit ride shortly after and noticed increased pain along his right arm.  He  noticed that when he would lay flat at night.  He presented to his PCP who has ordered a MRI study of his neck.  We reviewed his previous CT scans and recent cholesterol.  His discomfort appeared to be atypical, MSK,  versus reflux type pain.  He reported that he did not have a formal exercise routine.  I  asked him to increase his physical activity, continue to refrain from tobacco use, and  planned to continue his  medical therapy.   follow-up in 9 to 12 months was planned.  He was seen in follow-up by Merced Stair, PA-C on 03/04/2023.  He had been in the emergency department with chest pain on 02/07/2023.  It was relieved with Maalox and famotidine .  At the time of his cardiology visit he reported having sharp pain on the right side of his chest that would come and go at any time.  He continued to have cervical neck problems with pain that would go down his left arm.  He had pain with shoulder movements.  He denied chest pressure, dyspnea, palpitations, and lower extremity swelling.  He was somewhat physically active doing yard work but did not have formal exercise routine.  He reported reflux  type symptoms and pain after he would eat.  He was not taking his omeprazole regularly.  He reported liking to eat hot spicy food.  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  He was started on Protonix  daily.  He was encouraged to follow-up with neurosurgery.  He presented to the clinic 12/25/23 for follow-up evaluation and stated he had been doing fairly well.  He had not been exercising as much since summer.  He  noted that he exercised more when the weather was nice.  He reported  that he noticed his blood pressure I was better when he was using his CPAP.  His blood pressure was  initially 162/92 and on recheck was 148/82.  He was limited in his physical activity somewhat due to his spinal pain.  He did have some success losing weight with intermittent fasting.  I increased his valsartan  to 80 mg daily, his follow-up he met showed stable electrolytes and renal function.  He presents to the clinic today for follow-up evaluation and states he has not noticed much change  with his blood pressure with increased valsartan .  We reviewed secondary causes of hypertension.  He reports difficult to control hypertension for quite some time.  He continues to be physically active and rode his motorcycle to the beach last weekend.  He previously did not tolerate HCTZ due to low potassium.  I will order carvedilol 3.125 mg twice daily and increase his valsartan  to 160 mg daily.  I have asked him to continue to eat a low-sodium diet.  I will repeat a BMP in 1 to 2 weeks and have him follow-up in 2 to 3 months.  Today he denies chest pain, shortness of breath, lower extremity edema, fatigue, palpitations, melena, hematuria, hemoptysis, diaphoresis, weakness, presyncope, syncope, orthopnea, and PND.     Home Medications    Prior to Admission medications   Medication Sig Start Date End Date Taking? Authorizing Provider  aspirin  EC 81 MG tablet Take 1 tablet (81 mg total) by mouth daily. 04/22/13   Allred, Royston Cornea, MD  Cyanocobalamin  (B-12 PO) Take 1,000 mcg by mouth daily.    [provider]  diltiazem  (TIAZAC ) 360 MG 24 hr capsule TAKE 1 CAPSULE DAILY 10/17/20   Lenise Quince, MD  furosemide (LASIX) 20 MG tablet Take 20 mg by mouth.    [provider]  losartan  (COZAAR ) 50 MG tablet TAKE ONE TABLET AT BEDTIME 08/22/21   Lenise Quince, MD  Potassium Citrate 15 MEQ (1620 MG) TBCR Take 4 tablets by mouth daily. 12/03/18   [provider]  rosuvastatin (CRESTOR) 20 MG tablet Take 20 mg by mouth at bedtime. 01/11/20   [provider]  VITAMIN D  PO Take 2 tablets by mouth daily.    [provider]    Family History    Family History  Problem Relation Age of Onset   Hypertension Father    Heart attack Paternal Grandfather 8   Breast cancer Paternal Grandmother 50   He indicated that his mother is alive. He indicated that his father is alive. He indicated that the status of his paternal grandmother is unknown. He indicated that his  paternal grandfather is deceased.   Social History    Social History   Socioeconomic History   Marital status: Married    Spouse name: Not on file   Number of children: Not on file   Years  of education: Not on file   Highest education level: Not on file  Occupational History   Occupation: [employer]  Tobacco Use   Smoking status: Never   Smokeless tobacco: Never  Substance and Sexual Activity   Alcohol use: Yes    Alcohol/week: 0.0 standard drinks of alcohol    Comment: rare   Drug use: No   Sexual activity: Not on file  Other Topics Concern   Not on file  Social History Narrative   Lives in [place].  Works at [employer] as an Art gallery manager   Caffeine use: 1 per day   Right handed    Social Drivers of Corporate investment banker Strain: Not on file  Food Insecurity: Not on file  Transportation Needs: Not on file  Physical Activity: Not on file  Stress: Not on file  Social Connections: Not on file  Intimate Partner Violence: Not on file     Review of Systems    General:  No chills, fever, night sweats or weight changes.  Cardiovascular:  No chest pain, dyspnea on exertion, edema, orthopnea, palpitations, paroxysmal nocturnal dyspnea. Dermatological: No rash, lesions/masses Respiratory: No cough, dyspnea Urologic: No hematuria, dysuria Abdominal:   No nausea, vomiting, diarrhea, bright red blood per rectum, melena, or hematemesis Neurologic:  No visual changes, wkns, changes in mental status. All other systems reviewed and are otherwise negative except as noted above.  Physical Exam    VS:  BP (!) 152/86   Pulse 80   Ht 5\' 10"  (1.778 m)   Wt 214 lb 12.8 oz (97.4 kg)   SpO2 97%   BMI 30.82 kg/m  , BMI Body mass index is 30.82 kg/m. GEN: Well nourished, well developed, in no acute distress. HEENT: normal. Neck: Supple, no JVD, carotid bruits, or masses. Cardiac: RRR, no murmurs, rubs, or gallops. No clubbing, cyanosis, no edema.  Radials/DP/PT 2+ and equal bilaterally.   Respiratory:  Respirations regular and unlabored, clear to auscultation bilaterally. GI: Soft, nontender, nondistended, BS + x 4. MS: no deformity or atrophy. Skin: warm and dry, no rash. Neuro:  Strength and sensation are intact. Psych: Normal affect.  Accessory Clinical Findings    Recent Labs: 12/10/2023: Hemoglobin 14.3; Platelets 196 01/02/2024: BUN 16; Creatinine, Ser 1.00; Potassium 3.7; Sodium 145   Recent Lipid Panel    Component Value Date/Time   CHOL 162 04/23/2019 1005   TRIG 126 04/23/2019 1005   HDL 44 04/23/2019 1005   CHOLHDL 3.7 04/23/2019 1005   CHOLHDL 4 09/16/2014 1003   VLDL 13.8 09/16/2014 1003   LDLCALC 93 04/23/2019 1005    ECG personally reviewed by me today-none today.  EKG 05/16/2022 normal sinus rhythm 78 bpm no ST or T wave deviation  Myoview  02/01/2020- Nuclear stress EF: 60%. No wall motion abnormalities There was no ST segment deviation noted during stress. The study is normal. This is a low risk study. No evidence of ischemia or infarction.  Echocardiogram 07/06/2021  IMPRESSIONS     1. Left ventricular ejection fraction, by estimation, is 60 to 65%. The  left ventricle has normal function. The left ventricle has no regional  wall motion abnormalities. The left ventricular internal cavity size was  moderately dilated. Left ventricular  diastolic parameters were normal.   2. Right ventricular systolic function is normal. The right ventricular  size is normal.   3. Global longitudinal strain is -24.6%.   4. The mitral valve is normal in structure. Trivial mitral valve  regurgitation.   5.  The aortic valve is tricuspid. Aortic valve regurgitation is not  visualized. Mild aortic valve sclerosis is present, with no evidence of  aortic valve stenosis.   6. Aortic dilatation noted.   Comparison(s): The left ventricular function is unchanged.  Cardiac event monitor 08/07/2021 Patch Wear Time:  10 days and 22 hours  (2022-09-11T22:27:06-399 to 2022-09-22T20:51:30-0400)   Patient had a min HR of 51 bpm, max HR of 158 bpm, and avg HR of 72 bpm. Predominant underlying rhythm was Sinus Rhythm. 10 Supraventricular Tachycardia runs occurred, the run with the fastest interval lasting 12 beats with a max rate of 158 bpm, the  longest lasting 11 beats with an avg rate of 97 bpm. Supraventricular Tachycardia was detected within +/- 45 seconds of symptomatic patient event(s). Isolated SVEs were rare (<1.0%), SVE Couplets were rare (<1.0%), and SVE Triplets were rare (<1.0%).  Isolated VEs were rare (<1.0%), VE Couplets were rare (<1.0%), and no VE Triplets were present. Ventricular Trigeminy was present.    Sinus bradycardia, NSR, sinus tachycardia, occasional pac, brief PAT, rare PVC and couplet Alexandria Angel  Cardiac CT 11/13 Calcium  score 0  Assessment & Plan   1. Essential hypertension-BP today 156/84  Continue to maintain blood pressure log.   Continue  diltiazem ,   Increase valsartan  160 mg Start carvedilol 3.125 mg twice daily Maintain physical activity Repeat bimah in 1-2 weeks  Paroxysmal atrial fibrillation-HR today 80.  He underwent ablation 2018.  Cardiac event monitor 08/07/2021 showed no significant arrhythmias.  His anticoagulation stopped after ablation procedure.  He continues to deny  episodes of accelerated heart rate or irregular heartbeats.  Continue diltiazem  Continue furosemide as needed, potassium as needed Elevate lower extremities when not active, lower extremity support stockings   Chest discomfort-continues with brief intermittent neurologic type pain .   Follows with neurosurgery  Hyperlipidemia-LDL 93 on 04/23/19.  Continue rosuvastatin High-fiber diet Labs with PCP    Disposition: Follow-up with Dr. Audery Blazing or me in 2-3 months.  Chet Cota. Joshuajames Moehring NP-C     04/02/2024, 8:29 AM [facility] [address] Office [phone] Fax  [phone]  Notice: This dictation was prepared with Dragon dictation along with smaller phrase technology. Any transcriptional errors that result from this process are unintentional and may not be corrected upon review.  I spent 15 minutes examining this patient, reviewing medications, and using patient centered shared decision making involving  cardiac care.  I spent  20 minutes reviewing past medical history,  medications, and prior cardiac tests.

## 2024-04-02 ENCOUNTER — Ambulatory Visit: Payer: BC Managed Care – PPO | Attending: General Practice | Admitting: General Practice

## 2024-04-02 ENCOUNTER — Encounter: Payer: Self-pay | Admitting: General Practice

## 2024-04-02 VITALS — BP 152/86 | HR 80 | Ht 70.0 in | Wt 214.8 lb

## 2024-04-02 DIAGNOSIS — E7800 Pure hypercholesterolemia, unspecified: Secondary | ICD-10-CM | POA: Diagnosis not present

## 2024-04-02 DIAGNOSIS — Z87898 Personal history of other specified conditions: Secondary | ICD-10-CM

## 2024-04-02 DIAGNOSIS — I10 Essential (primary) hypertension: Secondary | ICD-10-CM

## 2024-04-02 DIAGNOSIS — E7849 Other hyperlipidemia: Secondary | ICD-10-CM | POA: Diagnosis not present

## 2024-04-02 DIAGNOSIS — I480 Paroxysmal atrial fibrillation: Secondary | ICD-10-CM | POA: Diagnosis not present

## 2024-04-02 MED ORDER — CARVEDILOL 3.125 MG PO TABS: 3.1250 mg | ORAL_TABLET | Freq: Two times a day (BID) | ORAL | 3 refills | Status: DC

## 2024-04-02 MED ORDER — VALSARTAN 160 MG PO TABS: 160.0000 mg | ORAL_TABLET | Freq: Every day | ORAL | 3 refills | Status: DC

## 2024-04-02 MED ORDER — VALSARTAN 160 MG PO TABS: ORAL_TABLET | ORAL | 3 refills | Status: DC

## 2024-04-02 NOTE — Patient Instructions (Signed)
 Medication Instructions:  Start Valsartan  160mg . Take this medication at night.  Start carvedilol 3.125mg . take this medication twice daily.   *If you need a refill on your cardiac medications before your next appointment, please call your pharmacy*   Lab Work: Return in 1 to 2 weeks to have your blood drawn. YOU DO NOT NEED TO FAST. If you have labs (blood work) drawn today and your tests are completely normal, you will receive your results only by: MyChart Message (if you have MyChart) OR A paper copy in the mail If you have any lab test that is abnormal or we need to change your treatment, we will call you to review the results.   Testing/Procedures: No medication changes were made during today's visit.    Follow-Up: At [facility], you and your health needs are our priority.  As part of our continuing mission to provide you with exceptional heart care, we have created designated Provider Care Teams.  These Care Teams include your primary Cardiologist (physician) and Advanced Practice Providers (APPs -  Physician Assistants and Nurse Practitioners) who all work together to provide you with the care you need, when you need it.  We recommend signing up for the patient portal called "MyChart".  Sign up information is provided on this After Visit Summary.  MyChart is used to connect with patients for Virtual Visits (Telemedicine).  Patients are able to view lab/test results, encounter notes, upcoming appointments, etc.  Non-urgent messages can be sent to your provider as well.   To learn more about what you can do with MyChart, go to [email or web address].    Your next appointment:   2-3 month(s)  Provider:   Lawana Pray, NP          Other Instructions  [facility] [place]:

## 2024-04-23 DIAGNOSIS — I480 Paroxysmal atrial fibrillation: Secondary | ICD-10-CM | POA: Diagnosis not present

## 2024-04-23 DIAGNOSIS — E7800 Pure hypercholesterolemia, unspecified: Secondary | ICD-10-CM | POA: Diagnosis not present

## 2024-04-23 DIAGNOSIS — I10 Essential (primary) hypertension: Secondary | ICD-10-CM | POA: Diagnosis not present

## 2024-04-23 DIAGNOSIS — E7849 Other hyperlipidemia: Secondary | ICD-10-CM | POA: Diagnosis not present

## 2024-04-24 ENCOUNTER — Other Ambulatory Visit: Payer: Self-pay

## 2024-04-24 ENCOUNTER — Emergency Department (HOSPITAL_BASED_OUTPATIENT_CLINIC_OR_DEPARTMENT_OTHER)
Admission: EM | Admit: 2024-04-24 | Discharge: 2024-04-24 | Disposition: A | Discharge status: Home / Self Care | Attending: Emergency Medicine | Admitting: Emergency Medicine

## 2024-04-24 DIAGNOSIS — Z7901 Long term (current) use of anticoagulants: Secondary | ICD-10-CM | POA: Diagnosis not present

## 2024-04-24 DIAGNOSIS — I10 Essential (primary) hypertension: Secondary | ICD-10-CM | POA: Insufficient documentation

## 2024-04-24 DIAGNOSIS — Z79899 Other long term (current) drug therapy: Secondary | ICD-10-CM | POA: Diagnosis not present

## 2024-04-24 DIAGNOSIS — R531 Weakness: Secondary | ICD-10-CM | POA: Insufficient documentation

## 2024-04-24 DIAGNOSIS — Z7982 Long term (current) use of aspirin: Secondary | ICD-10-CM | POA: Diagnosis not present

## 2024-04-24 DIAGNOSIS — R5383 Other fatigue: Secondary | ICD-10-CM | POA: Diagnosis not present

## 2024-04-24 DIAGNOSIS — R030 Elevated blood-pressure reading, without diagnosis of hypertension: Secondary | ICD-10-CM | POA: Diagnosis not present

## 2024-04-24 LAB — HEPATIC FUNCTION PANEL
ALT: 31 U/L (ref 0–44)
AST: 21 U/L (ref 15–41)
Albumin: 4.4 g/dL (ref 3.5–5.0)
Alkaline Phosphatase: 95 U/L (ref 38–126)
Bilirubin, Direct: 0.3 mg/dL — ABNORMAL HIGH (ref 0.0–0.2)
Indirect Bilirubin: 0.3 mg/dL (ref 0.3–0.9)
Total Bilirubin: 0.6 mg/dL (ref 0.0–1.2)
Total Protein: 7.2 g/dL (ref 6.5–8.1)

## 2024-04-24 LAB — CBC
HCT: 38.8 % — ABNORMAL LOW (ref 39.0–52.0)
Hemoglobin: 13.4 g/dL (ref 13.0–17.0)
MCH: 29.4 pg (ref 26.0–34.0)
MCHC: 34.5 g/dL (ref 30.0–36.0)
MCV: 85.1 fL (ref 80.0–100.0)
Platelets: 201 10*3/uL (ref 150–400)
RBC: 4.56 MIL/uL (ref 4.22–5.81)
RDW: 12.2 % (ref 11.5–15.5)
WBC: 6.1 10*3/uL (ref 4.0–10.5)
nRBC: 0 % (ref 0.0–0.2)

## 2024-04-24 LAB — URINALYSIS, ROUTINE W REFLEX MICROSCOPIC
Bilirubin Urine: NEGATIVE
Glucose, UA: NEGATIVE mg/dL
Hgb urine dipstick: NEGATIVE
Ketones, ur: NEGATIVE mg/dL
Leukocytes,Ua: NEGATIVE
Nitrite: NEGATIVE
Protein, ur: NEGATIVE mg/dL
Specific Gravity, Urine: 1.007 (ref 1.005–1.030)
pH: 7.5 (ref 5.0–8.0)

## 2024-04-24 LAB — TROPONIN T, HIGH SENSITIVITY: Troponin T High Sensitivity: <15 ng/L (ref <19)

## 2024-04-24 LAB — TSH: TSH: 1.25 u[IU]/mL (ref 0.350–4.500)

## 2024-04-24 LAB — MAGNESIUM: Magnesium: 1.9 mg/dL (ref 1.7–2.4)

## 2024-04-24 NOTE — ED Provider Notes (Signed)
 [place] EMERGENCY DEPARTMENT AT [facility] Provider Note   CSN: [id number] Arrival date & time: 04/24/24  1110     Patient presents with: Hypertension   Kevin Holmes is a 58 y.o. male.   Patient with of hypertension, atrial fibrillation, OSA, palpitations, hyperlipidemia, fatigue --presents to the emergency department today for evaluation of generalized weakness and lethargy as well as elevated blood pressures, worse in the morning.  Recently patient had carvedilol  added (3.125 mg twice daily) and valsartan  increased (doubled to 160 mg daily).  Patient had headache yesterday.  This is not atypical.  He states that he has headache sometimes due to stress and possible posture issues.  Patient's family member states that the symptoms seem to have been worse over the past week.  He reports some dizziness which he is describes as a little bit of movement when he looks around.  Patient denies signs of stroke including: facial droop, slurred speech, aphasia, weakness/numbness in extremities, imbalance/trouble walking.  No upper respiratory tract infection symptoms or cough.  No vomiting or diarrhea.  He has been urinating normally.  He participated in a motorcycle fundraiser this morning.  He was feeling generally unwell, blood pressure was checked and it was in the 170s systolic.         Prior to Admission medications   Medication Sig Start Date End Date Taking? Authorizing Provider  aspirin  EC 81 MG tablet Take 1 tablet (81 mg total) by mouth daily. 04/22/13   Allred, Royston Cornea, MD  carvedilol  (COREG ) 3.125 MG tablet Take 1 tablet (3.125 mg total) by mouth 2 (two) times daily. 04/02/24 07/01/24  Carie Charity, NP  cholecalciferol (VITAMIN D3) 25 MCG (1000 UNIT) tablet Take 1,000 Units by mouth daily.    [provider]  diltiazem  (TIAZAC ) 360 MG 24 hr capsule TAKE 1 CAPSULE DAILY 12/30/23   Lenise Quince, MD  EPINEPHrine 0.3 mg/0.3 mL IJ SOAJ injection SMARTSIG:IM As  Directed PRN 04/02/22   [provider]  Multiple Vitamins-Minerals (PRESERVISION AREDS) CAPS Take 1 capsule by mouth daily at 12 noon.    [provider]  pantoprazole  (PROTONIX ) 20 MG tablet TAKE ONE TABLET DAILY 02/25/24   Lenise Quince, MD  Potassium Citrate 15 MEQ (1620 MG) TBCR Take 4 tablets by mouth daily. 12/03/18   [provider]  rosuvastatin (CRESTOR) 20 MG tablet Take 20 mg by mouth daily. 05/11/22   [provider]  valsartan  (DIOVAN ) 160 MG tablet Take 1 tablet (160 mg total) by mouth daily. 04/02/24   Carie Charity, NP  vitamin B-12 (CYANOCOBALAMIN ) 1000 MCG tablet Take 1,000 mcg by mouth daily.    [provider]    Allergies: Alpha-gal and Other    Review of Systems  Updated Vital Signs BP (!) 149/87 (BP Location: Right Arm)   Pulse 62   Temp 97.9 F (36.6 C) (Oral)   Resp 13   SpO2 97%   Physical Exam Vitals and nursing note reviewed.  Constitutional:      Appearance: He is well-developed. He is not diaphoretic.  HENT:     Head: Normocephalic and atraumatic.     Right Ear: External ear normal.     Left Ear: External ear normal.     Nose: Nose normal.     Mouth/Throat:     Mouth: Mucous membranes are not dry.     Pharynx: Uvula midline.   Eyes:     General: Lids are normal.  Conjunctiva/sclera: Conjunctivae normal.     Pupils: Pupils are equal, round, and reactive to light.     Comments: A few beats of fatigable horizontal nystagmus when looking towards the right.  No vertical or rotational nystagmus noted.  Neck:     Vascular: Normal carotid pulses. No carotid bruit or JVD.     Trachea: Trachea normal. No tracheal deviation.   Cardiovascular:     Rate and Rhythm: Normal rate and regular rhythm.     Pulses: No decreased pulses.          Radial pulses are 2+ on the right side and 2+ on the left side.     Heart sounds: Normal heart sounds, S1 normal and S2 normal. Heart sounds not distant. No murmur  heard. Pulmonary:     Effort: Pulmonary effort is normal. No respiratory distress.     Breath sounds: Normal breath sounds. No wheezing.  Chest:     Chest wall: No tenderness.  Abdominal:     General: Bowel sounds are normal.     Palpations: Abdomen is soft.     Tenderness: There is no abdominal tenderness. There is no guarding or rebound.   Musculoskeletal:        General: Normal range of motion.     Cervical back: Normal range of motion and neck supple. No tenderness or bony tenderness. No muscular tenderness.     Right lower leg: No edema.     Left lower leg: No edema.   Skin:    General: Skin is warm and dry.     Coloration: Skin is not pale.   Neurological:     Mental Status: He is alert and oriented to person, place, and time. Mental status is at baseline.     GCS: GCS eye subscore is 4. GCS verbal subscore is 5. GCS motor subscore is 6.     Cranial Nerves: No cranial nerve deficit.     Sensory: No sensory deficit.     Motor: No abnormal muscle tone.     Coordination: Coordination normal.   Psychiatric:        Mood and Affect: Mood normal.    ED Course  Patient seen and examined. History obtained directly from patient and family member.  Reviewed recent cardiology notes.  Reviewed BMP results from labs performed yesterday, were all normal.  Labs/EKG: Ordered CBC, hepatic function panel, troponin x 1, magnesium, TSH, EKG.  Imaging: None ordered  Medications/Fluids: None ordered  Most recent vital signs reviewed and are as follows: BP (!) 149/87 (BP Location: Right Arm)   Pulse 62   Temp 97.9 F (36.6 C) (Oral)   Resp 13   SpO2 97%   Initial impression: Generalized weakness and lethargy, nonspecific dizziness.  Reassuring neuro exam.  Will check additional labs for screening purposes.  2:29 PM Reassessment performed. Patient appears stable.  He states that he is feeling a bit better than on arrival.  Labs personally reviewed and interpreted including: CBC  with normal white blood cell count and hemoglobin; hepatic function panel was unremarkable; troponin less than 15; magnesium normal at 1.9; TSH was normal at 1.25; UA without signs of infection.  Reviewed pertinent lab work and imaging with patient at bedside. Questions answered.   Most current vital signs reviewed and are as follows: BP (!) 147/94   Pulse (!) 57   Temp 97.9 F (36.6 C) (Oral)   Resp 18   SpO2 97%   Plan: Discharge to home.  Prescriptions written for: None  Other home care instructions discussed: Rest, hydration  ED return instructions discussed: Chest pain, shortness of breath, new or worsening symptoms  Follow-up instructions discussed: Patient encouraged to follow-up with their PCP in 3 days.  Would discuss blood pressure control with cardiologist who prescribes medications     (all labs ordered are listed, but only abnormal results are displayed) Labs Reviewed - No data to display  ED ECG REPORT   Date: 04/24/2024  Rate: 60  Rhythm: normal sinus rhythm  QRS Axis: normal  Intervals: normal  ST/T Wave abnormalities: normal  Conduction Disutrbances:none  Narrative Interpretation:   Old EKG Reviewed: unchanged from 01/25  I have personally reviewed the EKG tracing and agree with the computerized printout as noted.   Radiology: No results found.   Procedures   Medications Ordered in the ED - No data to display                                  Medical Decision Making Amount and/or Complexity of Data Reviewed Labs: ordered.   Patient with vague generalized weakness, nonfocal exam.  His main issue today was elevated blood pressures which have normalized after taking his home medications today.  Generalized workup was undertaken which was reassuring, does not show specific cause of the underlying weakness.  No signs of cardiac irritability or ACS.  Do not suspect PE without shortness of breath or associated chest pain.  No focal symptoms to  suggest CVA.  Electrolytes are normal as of lab testing yesterday.  Thyroid  testing was normal.  EKG does not show signs of A-fib at this time.  No evidence of endorgan damage at time of evaluation.  Patient would benefit from outpatient follow-up for continued symptoms.  The patient's vital signs, pertinent lab work and imaging were reviewed and interpreted as discussed in the ED course. Hospitalization was considered for further testing, treatments, or serial exams/observation. However as patient is well-appearing, has a stable exam, and reassuring studies today, I do not feel that they warrant admission at this time. This plan was discussed with the patient who verbalizes agreement and comfort with this plan and seems reliable and able to return to the Emergency Department with worsening or changing symptoms.       Final diagnoses:  Weakness  Primary hypertension    ED Discharge Orders     None          Lyna Sandhoff, PA-C 04/24/24 1433    Rosealee Concha, MD 04/26/24 1016

## 2024-04-24 NOTE — Discharge Instructions (Signed)
 Please read and follow all provided instructions.  Your diagnoses today include:  1. Weakness   2. Primary hypertension     Tests performed today include: An EKG of your heart: Showed normal rhythm, no complications Cardiac enzymes - a blood test for heart muscle damage, was normal Liver function test: Were normal Thyroid  stimulating hormone: Was normal Urine test was normal no sign of infection Vital signs. See below for your results today.   Medications prescribed:  None  Take any prescribed medications only as directed.  Follow-up instructions: Please follow-up with your primary care provider as soon as you can for further evaluation of your symptoms.   Return instructions:  SEEK IMMEDIATE MEDICAL ATTENTION IF: You have severe chest pain, especially if the pain is crushing or pressure-like and spreads to the arms, back, neck, or jaw, or if you have sweating, nausea or vomiting, or trouble with breathing. THIS IS AN EMERGENCY. Do not wait to see if the pain will go away. Get medical help at once. Call 911. DO NOT drive yourself to the hospital.  Your chest pain gets worse and does not go away after a few minutes of rest.  You have an attack of chest pain lasting longer than what you usually experience.  You have significant dizziness, if you pass out, or have trouble walking.  You have chest pain not typical of your usual pain for which you originally saw your caregiver.  You have any other emergent concerns regarding your health.  Additional Information: Chest pain comes from many different causes. Your caregiver has diagnosed you as having chest pain that is not specific for one problem, but does not require admission.  You are at low risk for an acute heart condition or other serious illness.   Your vital signs today were: BP (!) 147/94   Pulse (!) 57   Temp 97.9 F (36.6 C) (Oral)   Resp 18   SpO2 97%  If your blood pressure (BP) was elevated above 135/85 this visit,  please have this repeated by your doctor within one month. --------------

## 2024-04-24 NOTE — ED Triage Notes (Signed)
 C/o dizziness lethargic, and headache x 2 weeks. Recent HTN med changes. States that's when symptoms started.

## 2024-04-26 ENCOUNTER — Ambulatory Visit: Payer: Self-pay | Admitting: General Practice

## 2024-04-26 ENCOUNTER — Telehealth: Payer: Self-pay | Admitting: Cardiology

## 2024-04-26 DIAGNOSIS — I4891 Unspecified atrial fibrillation: Secondary | ICD-10-CM

## 2024-04-26 NOTE — Telephone Encounter (Signed)
 Patient identification verified by 2 forms. Sims Duck, RN     Called and spoke to patient  Patient states:  - Currently taking carvedilol  3.125mg  twice day and valsartan  160 mg daily and diltiazem  360mg  daily - feeling okay today. BP today was 156/94. Has taken medication but not rechecked BP yet.  - been having a lot of neck and disc issue. Has bone spurs in his neck that causes him pain sometimes.  - Having pain in between shoulder blades randomly. Pain is dull but very uncomfortable. Along with higher blood pressures he is concerned about this pain.   Patient denies:  Asymptomatic during call.              Interventions/Plan: - Encounter forwarded to primary cardiologist for review/recommendations.    Reviewed ED warning signs/precautions  Patient agrees with plan, no questions at this time

## 2024-04-26 NOTE — Telephone Encounter (Signed)
 Spoke with pt, Aware of dr Ludwig Clarks recommendations.

## 2024-04-26 NOTE — Telephone Encounter (Signed)
 Pt c/o BP issue: STAT if pt c/o blurred vision, one-sided weakness or slurred speech.  STAT if BP is GREATER than 180/120 TODAY.  STAT if BP is LESS than 90/60 and SYMPTOMATIC TODAY  1. What is your BP concern? Seen 2 ED on Sat and bp still elevated  2. Have you taken any BP medication today? yes  3. What are your last 5 BP readings?175/110, 161/96, 145/87, this morning-156/94  4. Are you having any other symptoms (ex. Dizziness, headache, blurred vision, passed out)? Only on sat, none this morning just doesn't feel good

## 2024-05-03 NOTE — Telephone Encounter (Signed)
 Pt c/o BP issue: STAT if pt c/o blurred vision, one-sided weakness or slurred speech.  STAT if BP is GREATER than 180/120 TODAY.  STAT if BP is LESS than 90/60 and SYMPTOMATIC TODAY  1. What is your BP concern? Pt states his bp has still been high in the mornings only.   2. Have you taken any BP medication today? yes   3. What are your last 5 BP readings?  6/23: 154/96           142/85 (after meds)  6/22: 164/97  6/21: 166/99   4. Are you having any other symptoms (ex. Dizziness, headache, blurred vision, passed out)? Dizziness that comes and goes

## 2024-05-03 NOTE — Telephone Encounter (Signed)
 6/23: 154/96           142/85 (after meds)  6/22: 164/97  6/21: 166/99   Pt says he has been checking his BP first thing ion the morning.... he had checked it once after his meds and it was 134/85.... but after doing some of his yard work he felt bad... his BP dropped to 100/76. He says he was hydrating well.   His last OV... 04/02/24:  Essential hypertension-BP today 156/84  Continue to maintain blood pressure log.   Continue  diltiazem ,   Increase valsartan  160 mg Start carvedilol  3.125 mg twice daily Maintain physical activity Repeat bmet in 1-2 weeks   Pt says that he feels really bad in the morning.. he wakes  up feeling bad: dizzy, headache, nausea... he takes:  A.M.... dilt and coreg  P.M.... Valsartan  and coreg   He is asking to dose the Dilt BID since he feels this works best for him.   I will send to Dr Pietro and Josefa for review.

## 2024-05-04 MED ORDER — DILTIAZEM HCL ER BEADS 180 MG PO CP24: 180.0000 mg | ORAL_CAPSULE | Freq: Two times a day (BID) | ORAL | 3 refills | Status: DC

## 2024-05-04 NOTE — Telephone Encounter (Signed)
 Spoke with patient and he states he forgot to ask about his valsartan . He states his carvedilol  has been stopped and his diltiazem  has been decreased. He would like to know if there were any changes to his valsartan .  Informed patient from the notes there has not been changes to his valsartan .  He would like a call back from [place]

## 2024-05-04 NOTE — Addendum Note (Signed)
 Addended by: Tykera Skates W on: 05/04/2024 10:49 AM   Modules accepted: Orders

## 2024-05-04 NOTE — Telephone Encounter (Signed)
 Pt c/o medication issue:  1. Name of Medication: valsartan  (DIOVAN ) 160 MG tablet   2. How are you currently taking this medication (dosage and times per day)? As written  3. Are you having a reaction (difficulty breathing--STAT)? No   4. What is your medication issue? Pt said he forgot toask if he was suppose to keep taking the Valsartan . Please advise

## 2024-05-04 NOTE — Telephone Encounter (Signed)
 Spoke with pt, aware to continue to track blood pressure and changes will be made based on those results.

## 2024-05-04 NOTE — Telephone Encounter (Signed)
Spoke with pt, Aware of dr crenshaw's recommendations. New script sent to the pharmacy  

## 2024-05-07 MED ORDER — METOPROLOL SUCCINATE ER 25 MG PO TB24: 25.0000 mg | ORAL_TABLET | Freq: Every day | ORAL | 3 refills | Status: DC

## 2024-05-07 NOTE — Addendum Note (Signed)
 Addended by: Kyera Felan N on: 05/07/2024 04:15 PM   Modules accepted: Orders

## 2024-05-07 NOTE — Telephone Encounter (Signed)
 Pt calling requesting cb to further discuss ongoing bp issues and med changes

## 2024-05-07 NOTE — Telephone Encounter (Signed)
 Spoke with Josefa Beauvais, NP for recommendations. Start toprol  XL 25 mg daily if HR above 60, continue BP log, no labs, follow up in a month and be seen for his cervical issues. Pt has appointment scheduled for August already. Pt stated understanding to the plan. Pt let me know that he had tried toprol  xl in the past but does not remember why he stopped. Pt is still willing to try medication. He will call back if BP does not come down. ED/911 precautions in case of symptoms and the weekend.

## 2024-05-07 NOTE — Telephone Encounter (Signed)
 Spoke with pt. Pt states his BP is still high with the changes. He will sometimes feel it in the back of his head at the time.  153/91 in the past 5 minutes from the time of call.  6/26 - 162/96 at lunch time 149/91 in the morning 6/25 - 166/100 in the morning 6/24 - 150/92 in the morning 6/23 - 155/88  Pt just wanted to touch base before the weekend.

## 2024-06-22 NOTE — Progress Notes (Signed)
 Cardiology Clinic Note   Patient Name: Kevin Holmes Date of Encounter: 06/24/2024  Primary Care Provider:  Renato Dorothey HERO, NP Primary Cardiologist:  Redell Shallow, MD  Patient Profile    Kevin Holmes 58 year old male presents the clinic for follow-up follow-up evaluation of his  palpitations, HTN, and atrial fibrillation.  Past Medical History    Past Medical History:  Diagnosis Date   History of gallstones    LAPAROSCOPIC CHOLECYSTECTOMY 2005   Hypertension    Paroxysmal atrial fibrillation (HCC)    Pure hypercholesterolemia    Sleep apnea    Past Surgical History:  Procedure Laterality Date   ATRIAL FIBRILLATION ABLATION N/A 10/20/2012   Procedure: ATRIAL FIBRILLATION ABLATION;  Surgeon: Lynwood Rakers, MD;  Location: [facility] CATH LAB;  Service: Cardiovascular;  Laterality: N/A;   EXTRACORPOREAL SHOCK WAVE LITHOTRIPSY Left 11/26/2021   Procedure: EXTRACORPOREAL SHOCK WAVE LITHOTRIPSY (ESWL);  Surgeon: Alvaro Hummer, MD;  Location: [facility];  Service: Urology;  Laterality: Left;   LAMINECTOMY     LAPAROSCOPIC CHOLECYSTECTOMY  07/04/2004 [facility]   DR. PAUL TOTH   TEE WITHOUT CARDIOVERSION  10/19/2012   Procedure: TRANSESOPHAGEAL ECHOCARDIOGRAM (TEE);  Surgeon: Vina LULLA Gull, MD;  Location: [facility] ENDOSCOPY;  Service: Cardiovascular;  Laterality: N/A;    Allergies  Allergies  Allergen Reactions   Alpha-Gal Anaphylaxis, Diarrhea, Hives and Nausea And Vomiting   Other Other (See Comments)    Meat allergy    History of Present Illness    Kevin Holmes is a 58 y.o. male with a hx of hypertension, atrial fibrillation, OSA, palpitations, hyperlipidemia, fatigue, and left arm pain.  He underwent ablation in 2018.  His anticoagulation was stopped after that.  Since that time he has had only occasional episodes of palpitations that were nonsustained.  He was noted to have fatigue while on beta blocking medications and was placed on diltiazem .  He was also  placed on Aldactone  which was stopped secondary to breast discomfort.  He was last seen by Comer RIGGERS on 08/10/2020.  He reported he was in the process of being evaluated for multiple sclerosis by neurology.     He presented to the emergency department on 06/16/2021 complaints of palpitations and chest discomfort.  He reported that his chest discomfort was on his right side.  He also noted increased palpitations over the past few weeks.  He presented to his PCP who ordered blood work.  He was noted to have an elevated D-dimer and was sent to the emergency department for further evaluation.  He denied shortness of breath.  He denied exertional chest discomfort.  His blood pressure at that time was 130/83 with a pulse of 60.  His EKG showed normal sinus rhythm 81 bpm.  His CT chest showed no evidence of PE.  Doppler ultrasound of his lower extremity showed no evidence of DVT.  His lab work was unremarkable and his troponins were negative.  It was recommended that he have a cardiac event monitor or echocardiogram to evaluate his symptoms associated with his lower extremity swelling and fatigue.  He was discharged in stable condition on 06/16/2021.   He presented to the clinic 06/19/2021 for follow-up evaluation stated he had fatigue for the last 5 years.  We reviewed his labs and diagnostics from the emergency department.  His vitamin D  was on the low side of normal.  He has Alpha-gal syndrome from a tick bite.  He does not eat as much meat.  He  reported compliance with his CPAP.  He had noticed some increased lower extremity swelling had been placed on furosemide as needed by his PCP.  He was also taking supplemental potassium.  I will ordered a vitamin B12 lab, repeated his echocardiogram, gave him a salty 6 diet sheet, gave the [place] support stocking sheet, and planned follow-up in 3 months.  I  asked him to start back with his vitamin D  supplementation as well.  His echocardiogram 07/06/2021 showed LVEF 60-65%  and no significant valvular abnormalities.   He presented the clinic for follow-up evaluation stated he stopped taking his furosemide.  He noticed that after he stopped taking the medication this palpitations significantly reduced.  He continued to take his potassium for hypokalemia.  We reviewed his echocardiogram and his medication regimen.  He reported that he was recently traveling and  had some nasal congestion.  He was not very physically active.   During that time he was not using his CPAP.  He felt that his blood pressure was elevated due to not using CPAP.  We discussed stopping losartan  and starting valsartan  medication, maintaining a blood pressure log and planned to have a BMP in 1 week.  Follow-up in 3 months was planned.  I have asked him to increase his physical activity as tolerated.  He was seen by Dr. Pietro on 01/04/2022.  During that time he described more dyspnea with more extreme activities but not with routine activities.  His breathing returned to baseline at rest.  He denied chest pain.  He denied orthopnea PND and lower extremity swelling.  He denied exertional chest discomfort.  He contacted the nurse triage line on 05/10/2022.  During that time he reported having chest discomfort more frequently over the last couple months.  He described the discomfort as being off and on in his arms and neck.  He reported that he had seen his PCP and had an EKG.  His PCP felt that his discomfort was related to a pinched nerve.  He presented in clinic 05/16/22 for follow-up evaluation stated he had noticed episodes of right arm pain and chest discomfort.  He had completed a 2500 mile motorcycle trip.  He then had a benefit ride shortly after and noticed increased pain along his right arm.  He  noticed that when he would lay flat at night.  He presented to his PCP who has ordered a MRI study of his neck.  We reviewed his previous CT scans and recent cholesterol.  His discomfort appeared to be  atypical, MSK, versus reflux type pain.  He reported that he did not have a formal exercise routine.  I  asked him to increase his physical activity, continue to refrain from tobacco use, and  planned to continue his  medical therapy.   follow-up in 9 to 12 months was planned.  He was seen in follow-up by Rosaline Pippin, PA-C on 03/04/2023.  He had been in the emergency department with chest pain on 02/07/2023.  It was relieved with Maalox and famotidine .  At the time of his cardiology visit he reported having sharp pain on the right side of his chest that would come and go at any time.  He continued to have cervical neck problems with pain that would go down his left arm.  He had pain with shoulder movements.  He denied chest pressure, dyspnea, palpitations, and lower extremity swelling.  He was somewhat physically active doing yard work but did not have formal exercise routine.  He reported reflux type symptoms and pain after he would eat.  He was not taking his omeprazole regularly.  He reported liking to eat hot spicy food.  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  He was started on Protonix  daily.  He was encouraged to follow-up with neurosurgery.  He presented to the clinic 12/25/23 for follow-up evaluation and stated he had been doing fairly well.  He had not been exercising as much since summer.  He  noted that he exercised more when the weather was nice.  He reported  that he noticed his blood pressure I was better when he was using his CPAP.  His blood pressure was  initially 162/92 and on recheck was 148/82.  He was limited in his physical activity somewhat due to his spinal pain.  He did have some success losing weight with intermittent fasting.  I increased his valsartan  to 80 mg daily, his follow-up he met showed stable electrolytes and renal function.    He presented to the clinic 04/02/24 for follow-up evaluation and stated he had not  noticed much change with his blood pressure with increased valsartan .  We reviewed secondary causes of hypertension.  He reported difficult to control hypertension for quite some time.  He continued to be physically active and rode his motorcycle to the beach the prior weekend.  He previously did not tolerate HCTZ due to low potassium.  I ordered carvedilol  3.125 mg twice daily and increase his valsartan  to 160 mg daily.  I  asked him to continue to eat a low-sodium diet.  I planned repeat a BMP in 1 to 2 weeks and have him follow-up in 2 to 3 months.  He was seen and evaluated in the ED 6/25 with elevated blood pressure.  He contacted cardiology office and his carvedilol  was discontinued.  He was placed on metoprolol  succinate.  He presents to the clinic today for follow-up evaluation and states he has not noticed much reduction in his blood pressure since transitioning from carvedilol  to metoprolol .  He is tolerating valsartan  well.  Continues to use diltiazem  180 twice daily.  He notes elevated blood pressures in the mornings.  He has been taking his valsartan  at night.  We reviewed secondary causes of hypertension.  He expressed understanding.  He notes that he is in the process of starting back on his BiPAP machine he has not used it for 2 to 3 weeks and is purchasing supplies.  We reviewed medication options.  He is not a candidate for spironolactone  due to full sensitivity.  Initially his blood pressure today is 166/94 and on recheck is 150/86.  His pulse is 52.  I will increase his valsartan  to 320 mg daily, have him maintain blood pressure log, ordered BMP in 1 to 2 weeks and plan follow-up in 2 months.  Today he denies chest pain, shortness of breath, lower extremity edema, fatigue, palpitations, melena, hematuria, hemoptysis, diaphoresis, weakness, presyncope, syncope, orthopnea, and PND.     Home Medications    Prior to Admission medications   Medication Sig Start Date End Date Taking?  Authorizing Provider  aspirin  EC 81 MG tablet Take 1 tablet (81 mg total) by mouth daily. 04/22/13   Allred, Lynwood, MD  Cyanocobalamin  (B-12 PO) Take 1,000 mcg by mouth daily.    [provider]  diltiazem  (TIAZAC ) 360 MG 24 hr capsule TAKE 1 CAPSULE DAILY 10/17/20  Pietro Redell RAMAN, MD  furosemide (LASIX) 20 MG tablet Take 20 mg by mouth.    [provider]  losartan  (COZAAR ) 50 MG tablet TAKE ONE TABLET AT BEDTIME 08/22/21   Pietro Redell RAMAN, MD  Potassium Citrate 15 MEQ (1620 MG) TBCR Take 4 tablets by mouth daily. 12/03/18   [provider]  rosuvastatin (CRESTOR) 20 MG tablet Take 20 mg by mouth at bedtime. 01/11/20   [provider]  VITAMIN D  PO Take 2 tablets by mouth daily.    [provider]    Family History    Family History  Problem Relation Age of Onset   Hypertension Father    Heart attack Paternal Grandfather 73   Breast cancer Paternal Grandmother 60   He indicated that his mother is alive. He indicated that his father is alive. He indicated that the status of his paternal grandmother is unknown. He indicated that his paternal grandfather is deceased.   Social History    Social History   Socioeconomic History   Marital status: Married    Spouse name: Not on file   Number of children: Not on file   Years of education: Not on file   Highest education level: Not on file  Occupational History   Occupation: [employer]  Tobacco Use   Smoking status: Never   Smokeless tobacco: Never  Substance and Sexual Activity   Alcohol use: Yes    Alcohol/week: 0.0 standard drinks of alcohol    Comment: rare   Drug use: No   Sexual activity: Not on file  Other Topics Concern   Not on file  Social History Narrative   Lives in [place].  Works at [employer] as an Art gallery manager   Caffeine use: 1 per day   Right handed    Social Drivers of Corporate investment banker Strain: Not on file  Food Insecurity: Not on file  Transportation Needs: Not  on file  Physical Activity: Not on file  Stress: Not on file  Social Connections: Not on file  Intimate Partner Violence: Not on file     Review of Systems    General:  No chills, fever, night sweats or weight changes.  Cardiovascular:  No chest pain, dyspnea on exertion, edema, orthopnea, palpitations, paroxysmal nocturnal dyspnea. Dermatological: No rash, lesions/masses Respiratory: No cough, dyspnea Urologic: No hematuria, dysuria Abdominal:   No nausea, vomiting, diarrhea, bright red blood per rectum, melena, or hematemesis Neurologic:  No visual changes, wkns, changes in mental status. All other systems reviewed and are otherwise negative except as noted above.  Physical Exam    VS:  BP (!) 150/86   Pulse (!) 52   Ht 5' 10 (1.778 m)   Wt 222 lb (100.7 kg)   BMI 31.85 kg/m  , BMI Body mass index is 31.85 kg/m. GEN: Well nourished, well developed, in no acute distress. HEENT: normal. Neck: Supple, no JVD, carotid bruits, or masses. Cardiac: RRR, no murmurs, rubs, or gallops. No clubbing, cyanosis, no edema.  Radials/DP/PT 2+ and equal bilaterally.  Respiratory:  Respirations regular and unlabored, clear to auscultation bilaterally. GI: Soft, nontender, nondistended, BS + x 4. MS: no deformity or atrophy. Skin: warm and dry, no rash. Neuro:  Strength and sensation are intact. Psych: Normal affect.  Accessory Clinical Findings    Recent Labs: 04/23/2024: BUN 11; Creatinine, Ser 1.02; Potassium 3.8; Sodium 143 04/24/2024: ALT 31; Hemoglobin 13.4; Magnesium 1.9; Platelets 201; TSH 1.250   Recent Lipid Panel  Component Value Date/Time   CHOL 162 04/23/2019 1005   TRIG 126 04/23/2019 1005   HDL 44 04/23/2019 1005   CHOLHDL 3.7 04/23/2019 1005   CHOLHDL 4 09/16/2014 1003   VLDL 13.8 09/16/2014 1003   LDLCALC 93 04/23/2019 1005    ECG personally reviewed by me today-none today.  EKG 05/16/2022 normal sinus rhythm 78 bpm no ST or T wave deviation  Myoview   02/01/2020- Nuclear stress EF: 60%. No wall motion abnormalities There was no ST segment deviation noted during stress. The study is normal. This is a low risk study. No evidence of ischemia or infarction.  Echocardiogram 07/06/2021  IMPRESSIONS     1. Left ventricular ejection fraction, by estimation, is 60 to 65%. The  left ventricle has normal function. The left ventricle has no regional  wall motion abnormalities. The left ventricular internal cavity size was  moderately dilated. Left ventricular  diastolic parameters were normal.   2. Right ventricular systolic function is normal. The right ventricular  size is normal.   3. Global longitudinal strain is -24.6%.   4. The mitral valve is normal in structure. Trivial mitral valve  regurgitation.   5. The aortic valve is tricuspid. Aortic valve regurgitation is not  visualized. Mild aortic valve sclerosis is present, with no evidence of  aortic valve stenosis.   6. Aortic dilatation noted.   Comparison(s): The left ventricular function is unchanged.  Cardiac event monitor 08/07/2021 Patch Wear Time:  10 days and 22 hours (2022-09-11T22:27:06-399 to 2022-09-22T20:51:30-0400)   Patient had a min HR of 51 bpm, max HR of 158 bpm, and avg HR of 72 bpm. Predominant underlying rhythm was Sinus Rhythm. 10 Supraventricular Tachycardia runs occurred, the run with the fastest interval lasting 12 beats with a max rate of 158 bpm, the  longest lasting 11 beats with an avg rate of 97 bpm. Supraventricular Tachycardia was detected within +/- 45 seconds of symptomatic patient event(s). Isolated SVEs were rare (<1.0%), SVE Couplets were rare (<1.0%), and SVE Triplets were rare (<1.0%).  Isolated VEs were rare (<1.0%), VE Couplets were rare (<1.0%), and no VE Triplets were present. Ventricular Trigeminy was present.    Sinus bradycardia, NSR, sinus tachycardia, occasional pac, brief PAT, rare PVC and couplet Redell Shallow  Cardiac CT  11/13 Calcium  score 0  Assessment & Plan   1. Essential hypertension-BP today 150/86.  Seen and evaluated in the emergency department on 6/25.  Lab work reassuring.  Contacted cardiology office and his carvedilol  was discontinued.  He was placed on metoprolol  succinate. Continue to maintain blood pressure log.  Continue to avoid nicotine. Continue  diltiazem , valsartan , metoprolol  succinate Increase valsartan  to 320 mg daily Maintain physical activity Start using CPAP again regularly Heart healthy low-sodium diet-reviewed Order BMP in 1-2 weeks  Paroxysmal atrial fibrillation-HR today 52.  He previously underwent ablation 2018.  Cardiac event monitor 08/07/2021 showed no significant arrhythmias.  His anticoagulation stopped after ablation procedure.  No recent episodes of accelerated or irregular heartbeat.  Continue diltiazem  Avoid triggers caffeine, chocolate, EtOH, dehydration etc.   Chest discomfort-continues with brief intermittent neurologic type pain .   Follows with neurosurgery  Hyperlipidemia-LDL 93 on 04/23/19.  Continue rosuvastatin High-fiber diet Labs with PCP   Cervical pain-reports bone spurs in his neck which cause intermittent pain.  Occasionally notes numbness and tingling radiating down arms. Follow-up with neurosurgery  Disposition: Follow-up with Dr. Shallow or me in 2 months.  Josefa HERO. Tiane Szydlowski NP-C     06/24/2024, 12:13  PM [facility] [address] Office [phone] Fax [phone]  Notice: This dictation was prepared with Dragon dictation along with smaller phrase technology. Any transcriptional errors that result from this process are unintentional and may not be corrected upon review.  I spent 14 minutes examining this patient, reviewing medications, and using patient centered shared decision making involving  cardiac care.

## 2024-06-24 ENCOUNTER — Ambulatory Visit: Attending: General Practice | Admitting: General Practice

## 2024-06-24 ENCOUNTER — Encounter: Payer: Self-pay | Admitting: General Practice

## 2024-06-24 VITALS — BP 150/86 | HR 52 | Ht 70.0 in | Wt 222.0 lb

## 2024-06-24 DIAGNOSIS — I10 Essential (primary) hypertension: Secondary | ICD-10-CM

## 2024-06-24 DIAGNOSIS — E7849 Other hyperlipidemia: Secondary | ICD-10-CM | POA: Diagnosis not present

## 2024-06-24 DIAGNOSIS — I480 Paroxysmal atrial fibrillation: Secondary | ICD-10-CM

## 2024-06-24 DIAGNOSIS — M542 Cervicalgia: Secondary | ICD-10-CM

## 2024-06-24 DIAGNOSIS — Z87898 Personal history of other specified conditions: Secondary | ICD-10-CM

## 2024-06-24 MED ORDER — VALSARTAN 320 MG PO TABS: 320.0000 mg | ORAL_TABLET | Freq: Every day | ORAL | 3 refills | Status: AC

## 2024-06-24 NOTE — Patient Instructions (Signed)
 Medication Instructions:  Your physician has recommended you make the following change in your medication:  INCREASE VALSARTAN  TO 320 MG DAILY.   *If you need a refill on your cardiac medications before your next appointment, please call your pharmacy*  Lab Work: TO BE DONE 1-2 WEEKS: BMET  If you have labs (blood work) drawn today and your tests are completely normal, you will receive your results only by: MyChart Message (if you have MyChart) OR A paper copy in the mail If you have any lab test that is abnormal or we need to change your treatment, we will call you to review the results.  Testing/Procedures: NONE  Follow-Up: At [facility], you and your health needs are our priority.  As part of our continuing mission to provide you with exceptional heart care, our providers are all part of one team.  This team includes your primary Cardiologist (physician) and Advanced Practice Providers or APPs (Physician Assistants and Nurse Practitioners) who all work together to provide you with the care you need, when you need it.  Your next appointment:   2 month(s)  Provider:   Redell Shallow, MD or Josefa Beauvais, NP  We recommend signing up for the patient portal called MyChart.  Sign up information is provided on this After Visit Summary.  MyChart is used to connect with patients for Virtual Visits (Telemedicine).  Patients are able to view lab/test results, encounter notes, upcoming appointments, etc.  Non-urgent messages can be sent to your provider as well.   To learn more about what you can do with MyChart, go to [email or web address].   Other Instructions Exercise regularly as told by your doctor. Make sure to weight daily and keep a weight log.   Moderate-intensity exercise is any activity that gets you moving enough to burn at least three times more energy (calories) than if you were sitting. Examples of moderate exercise include: Walking a mile in 15 minutes. Doing  light yard work. Biking at an easy pace. Most people should get at least 150 minutes of moderate-intensity exercise a week to maintain their body weight.  Increase your water intake: Maintain hydration

## 2024-06-25 DIAGNOSIS — H43392 Other vitreous opacities, left eye: Secondary | ICD-10-CM | POA: Diagnosis not present

## 2024-06-25 DIAGNOSIS — H35363 Drusen (degenerative) of macula, bilateral: Secondary | ICD-10-CM | POA: Diagnosis not present

## 2024-06-25 DIAGNOSIS — H2513 Age-related nuclear cataract, bilateral: Secondary | ICD-10-CM | POA: Diagnosis not present

## 2024-06-25 DIAGNOSIS — H353131 Nonexudative age-related macular degeneration, bilateral, early dry stage: Secondary | ICD-10-CM | POA: Diagnosis not present

## 2024-07-15 ENCOUNTER — Telehealth: Payer: Self-pay | Admitting: Cardiology

## 2024-07-15 DIAGNOSIS — G4733 Obstructive sleep apnea (adult) (pediatric): Secondary | ICD-10-CM | POA: Diagnosis not present

## 2024-07-15 DIAGNOSIS — I10 Essential (primary) hypertension: Secondary | ICD-10-CM

## 2024-07-15 MED ORDER — CHLORTHALIDONE 25 MG PO TABS: 25.0000 mg | ORAL_TABLET | Freq: Every day | ORAL | 3 refills | Status: DC

## 2024-07-15 NOTE — Telephone Encounter (Signed)
 Spoke with pt, he felt fine last night but did check his blood pressure and it was elevated. He took an extra diltiazem  and his pressure came down some. This moening his blood pressure is 150/95. He takes the diltiazem  twice daily, the metoprolol  is taken in the am and the valsartan  taken in the pm. At last office visit, his valsartan  was increased. He reports he feels fine today. Will forward for dr pietro review

## 2024-07-15 NOTE — Telephone Encounter (Signed)
Spoke with pt, Aware of dr crenshaw's recommendations. New script sent to the pharmacy and Lab orders mailed to the pt  

## 2024-07-15 NOTE — Telephone Encounter (Signed)
 Pt c/o BP issue: STAT if pt c/o blurred vision, one-sided weakness or slurred speech.   1. What is your BP concern?  BP has been changed 3-4 times over the past month, but just keeps getting higher  2. Have you taken any BP medication today? Yes   3. What are your last 5 BP readings? 160/97 1 hour after medications 190/105 last night   4. Are you having any other symptoms (ex. Dizziness, headache, blurred vision, passed out)?  No

## 2024-07-16 MED ORDER — CARVEDILOL 6.25 MG PO TABS: 6.2500 mg | ORAL_TABLET | Freq: Two times a day (BID) | ORAL | 3 refills | Status: DC

## 2024-07-16 NOTE — Telephone Encounter (Signed)
 Spoke with pt who reports BP this morning prior to taking medications was 187/104 HR-68.  One hour later 149/97 HR-57.  Pt denies current CP, SOB, or dizziness.  Pt started Chlorthalidone  yesterday but does not feel this is the best medication for his HTN.  Pt feels Diltiazem  works the best at lowering his BP and would like the provider to consider increasing Dilt. Pt advised he should check BP 2 hours after taking medications.  Pt also advised it may take a few days for him to see the optimal benefit of adding the Chlorthalidone .  Encouraged to also follow low sodium diet. Pt advised will forward to provider for further review and recommendation as requested.  Pt verbalizes understanding and thanked Charity fundraiser for the call.

## 2024-07-16 NOTE — Telephone Encounter (Signed)
Spoke with pt, Aware of dr crenshaw's recommendations. New script sent to the pharmacy  

## 2024-07-16 NOTE — Telephone Encounter (Signed)
 Patient states bp is still running high. Please advise

## 2024-07-16 NOTE — Addendum Note (Signed)
 Addended by: Malahki Gasaway W on: 07/16/2024 10:19 AM   Modules accepted: Orders

## 2024-07-16 NOTE — Telephone Encounter (Signed)
 Spoke with pt, he reports he was on carvedilol  in the past and it did not work. He is willing to try the increased dose of carvedilol . He had previously been on 3.125 mg.

## 2024-07-16 NOTE — Telephone Encounter (Signed)
Patient is calling back to speak to the nurse. Please advise

## 2024-08-06 DIAGNOSIS — Z125 Encounter for screening for malignant neoplasm of prostate: Secondary | ICD-10-CM | POA: Diagnosis not present

## 2024-08-06 DIAGNOSIS — N200 Calculus of kidney: Secondary | ICD-10-CM | POA: Diagnosis not present

## 2024-08-06 DIAGNOSIS — E756 Lipid storage disorder, unspecified: Secondary | ICD-10-CM | POA: Diagnosis not present

## 2024-08-06 DIAGNOSIS — E559 Vitamin D deficiency, unspecified: Secondary | ICD-10-CM | POA: Diagnosis not present

## 2024-08-06 DIAGNOSIS — I10 Essential (primary) hypertension: Secondary | ICD-10-CM | POA: Diagnosis not present

## 2024-08-09 ENCOUNTER — Telehealth: Payer: Self-pay | Admitting: Cardiology

## 2024-08-09 ENCOUNTER — Ambulatory Visit: Payer: Self-pay | Admitting: Cardiology

## 2024-08-09 DIAGNOSIS — E876 Hypokalemia: Secondary | ICD-10-CM

## 2024-08-09 DIAGNOSIS — I10 Essential (primary) hypertension: Secondary | ICD-10-CM

## 2024-08-09 LAB — LAB REPORT - SCANNED: EGFR: 83

## 2024-08-09 MED ORDER — POTASSIUM CHLORIDE ER 20 MEQ PO TBCR
EXTENDED_RELEASE_TABLET | ORAL | 0 refills | Status: DC
Start: 2024-08-09 — End: 2024-08-26

## 2024-08-09 MED ORDER — CHLORTHALIDONE 25 MG PO TABS: 12.5000 mg | ORAL_TABLET | Freq: Every day | ORAL | Status: DC

## 2024-08-09 NOTE — Telephone Encounter (Signed)
 Spoke with pt, he is on potassium citrate due to kidney stones. He confirms he is taking 4 tablets daily. Aware will discuss with dr pietro.

## 2024-08-09 NOTE — Telephone Encounter (Signed)
 Spoke with pt. Asked if I could take message for Adrien.   Pt got lab work done at PCP. PCP is concerned with Potassium levels and advised to call us .   Advised pt that I would get that message to [facility]. PCP is to fax lab results.

## 2024-08-09 NOTE — Telephone Encounter (Signed)
 Pt is requesting to speak with Dr Pietro nurse Barnie concerning some test results

## 2024-08-11 NOTE — Telephone Encounter (Signed)
 See result notes.

## 2024-08-13 ENCOUNTER — Ambulatory Visit: Payer: Self-pay | Admitting: Cardiology

## 2024-08-13 DIAGNOSIS — E876 Hypokalemia: Secondary | ICD-10-CM

## 2024-08-13 LAB — LAB REPORT - SCANNED: EGFR: 88

## 2024-08-13 MED ORDER — POTASSIUM CHLORIDE CRYS ER 20 MEQ PO TBCR: 20.0000 meq | EXTENDED_RELEASE_TABLET | Freq: Every day | ORAL | 3 refills | Status: AC

## 2024-08-13 NOTE — Telephone Encounter (Signed)
 Pt requesting callback to discuss test results. Please advise.

## 2024-08-18 DIAGNOSIS — E876 Hypokalemia: Secondary | ICD-10-CM | POA: Diagnosis not present

## 2024-08-24 DIAGNOSIS — E876 Hypokalemia: Secondary | ICD-10-CM | POA: Diagnosis not present

## 2024-08-24 LAB — LAB REPORT - SCANNED: EGFR: 89

## 2024-08-24 NOTE — Progress Notes (Unsigned)
 Cardiology Clinic Note   Patient Name: Kevin Holmes Date of Encounter: 08/26/2024  Primary Care Provider:  Renato Dorothey HERO, NP Primary Cardiologist:  Redell Shallow, MD  Patient Profile    Kevin Holmes 58 year old male presents the clinic for follow-up follow-up evaluation of his  HTN, and atrial fibrillation.  Past Medical History    Past Medical History:  Diagnosis Date   History of gallstones    LAPAROSCOPIC CHOLECYSTECTOMY 2005   Hypertension    Paroxysmal atrial fibrillation (HCC)    Pure hypercholesterolemia    Sleep apnea    Past Surgical History:  Procedure Laterality Date   ATRIAL FIBRILLATION ABLATION N/A 10/20/2012   Procedure: ATRIAL FIBRILLATION ABLATION;  Surgeon: Lynwood Rakers, MD;  Location: [facility] CATH LAB;  Service: Cardiovascular;  Laterality: N/A;   EXTRACORPOREAL SHOCK WAVE LITHOTRIPSY Left 11/26/2021   Procedure: EXTRACORPOREAL SHOCK WAVE LITHOTRIPSY (ESWL);  Surgeon: Alvaro Hummer, MD;  Location: [facility];  Service: Urology;  Laterality: Left;   LAMINECTOMY     LAPAROSCOPIC CHOLECYSTECTOMY  07/04/2004 [facility]   DR. PAUL TOTH   TEE WITHOUT CARDIOVERSION  10/19/2012   Procedure: TRANSESOPHAGEAL ECHOCARDIOGRAM (TEE);  Surgeon: Vina LULLA Gull, MD;  Location: [facility] ENDOSCOPY;  Service: Cardiovascular;  Laterality: N/A;    Allergies  Allergies  Allergen Reactions   Alpha-Gal Anaphylaxis, Diarrhea, Hives and Nausea And Vomiting   Spironolactone  Other (See Comments)    Nipple sensitivity    Other Other (See Comments)    Meat allergy    History of Present Illness    Kevin Holmes is a 58 y.o. male with a hx of hypertension, atrial fibrillation, OSA, palpitations, hyperlipidemia, fatigue, and left arm pain.  He underwent ablation in 2018.  His anticoagulation was stopped after that.  Since that time he has had only occasional episodes of palpitations that were nonsustained.  He was noted to have fatigue while on beta blocking  medications and was placed on diltiazem .  He was also placed on Aldactone  which was stopped secondary to breast discomfort.  He was last seen by Comer RIGGERS on 08/10/2020.  He reported he was in the process of being evaluated for multiple sclerosis by neurology.     He presented to the emergency department on 06/16/2021 complaints of palpitations and chest discomfort.  He reported that his chest discomfort was on his right side.  He also noted increased palpitations over the past few weeks.  He presented to his PCP who ordered blood work.  He was noted to have an elevated D-dimer and was sent to the emergency department for further evaluation.  He denied shortness of breath.  He denied exertional chest discomfort.  His blood pressure at that time was 130/83 with a pulse of 60.  His EKG showed normal sinus rhythm 81 bpm.  His CT chest showed no evidence of PE.  Doppler ultrasound of his lower extremity showed no evidence of DVT.  His lab work was unremarkable and his troponins were negative.  It was recommended that he have a cardiac event monitor or echocardiogram to evaluate his symptoms associated with his lower extremity swelling and fatigue.  He was discharged in stable condition on 06/16/2021.   He presented to the clinic 06/19/2021 for follow-up evaluation stated he had fatigue for the last 5 years.  We reviewed his labs and diagnostics from the emergency department.  His vitamin D  was on the low side of normal.  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  He does not eat as much meat.  He reported compliance with his CPAP.  He had noticed some increased lower extremity swelling had been placed on furosemide as needed by his PCP.  He was also taking supplemental potassium.  I will ordered a vitamin B12 lab, repeated his echocardiogram, gave him a salty 6 diet sheet, gave the [place] support stocking sheet, and planned follow-up in 3 months.  I  asked him to start back with his vitamin D  supplementation as  well.  His echocardiogram 07/06/2021 showed LVEF 60-65% and no significant valvular abnormalities.   He presented the clinic for follow-up evaluation stated he stopped taking his furosemide.  He noticed that after he stopped taking the medication this palpitations significantly reduced.  He continued to take his potassium for hypokalemia.  We reviewed his echocardiogram and his medication regimen.  He reported that he was recently traveling and  had some nasal congestion.  He was not very physically active.   During that time he was not using his CPAP.  He felt that his blood pressure was elevated due to not using CPAP.  We discussed stopping losartan  and starting valsartan  medication, maintaining a blood pressure log and planned to have a BMP in 1 week.  Follow-up in 3 months was planned.  I have asked him to increase his physical activity as tolerated.  He was seen by Dr. Pietro on 01/04/2022.  During that time he described more dyspnea with more extreme activities but not with routine activities.  His breathing returned to baseline at rest.  He denied chest pain.  He denied orthopnea PND and lower extremity swelling.  He denied exertional chest discomfort.  He contacted the nurse triage line on 05/10/2022.  During that time he reported having chest discomfort more frequently over the last couple months.  He described the discomfort as being off and on in his arms and neck.  He reported that he had seen his PCP and had an EKG.  His PCP felt that his discomfort was related to a pinched nerve.  He presented in clinic 05/16/22 for follow-up evaluation stated he had noticed episodes of right arm pain and chest discomfort.  He had completed a 2500 mile motorcycle trip.  He then had a benefit ride shortly after and noticed increased pain along his right arm.  He  noticed that when he would lay flat at night.  He presented to his PCP who has ordered a MRI study of his neck.  We reviewed his previous CT scans and  recent cholesterol.  His discomfort appeared to be atypical, MSK, versus reflux type pain.  He reported that he did not have a formal exercise routine.  I  asked him to increase his physical activity, continue to refrain from tobacco use, and  planned to continue his  medical therapy.   follow-up in 9 to 12 months was planned.  He was seen in follow-up by Rosaline Pippin, PA-C on 03/04/2023.  He had been in the emergency department with chest pain on 02/07/2023.  It was relieved with Maalox and famotidine .  At the time of his cardiology visit he reported having sharp pain on the right side of his chest that would come and go at any time.  He continued to have cervical neck problems with pain that would go down his left arm.  He had pain with shoulder movements.  He denied chest pressure, dyspnea, palpitations, and lower extremity swelling.  He was somewhat physically  active doing yard work but did not have formal exercise routine.  He reported reflux type symptoms and pain after he would eat.  He was not taking his omeprazole regularly.  He reported liking to eat hot spicy food.  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  He was started on Protonix  daily.  He was encouraged to follow-up with neurosurgery.  He presented to the clinic 12/25/23 for follow-up evaluation and stated he had been doing fairly well.  He had not been exercising as much since summer.  He  noted that he exercised more when the weather was nice.  He reported  that he noticed his blood pressure I was better when he was using his CPAP.  His blood pressure was  initially 162/92 and on recheck was 148/82.  He was limited in his physical activity somewhat due to his spinal pain.  He did have some success losing weight with intermittent fasting.  I increased his valsartan  to 80 mg daily, his follow-up he met showed stable electrolytes and renal function.    He presented to the clinic  04/02/24 for follow-up evaluation and stated he had not noticed much change with his blood pressure with increased valsartan .  We reviewed secondary causes of hypertension.  He reported difficult to control hypertension for quite some time.  He continued to be physically active and rode his motorcycle to the beach the prior weekend.  He previously did not tolerate HCTZ due to low potassium.  I ordered carvedilol  3.125 mg twice daily and increase his valsartan  to 160 mg daily.  I  asked him to continue to eat a low-sodium diet.  I planned repeat a BMP in 1 to 2 weeks and have him follow-up in 2 to 3 months.  He was seen and evaluated in the ED 6/25 with elevated blood pressure.  He contacted cardiology office and his carvedilol  was discontinued.  He was placed on metoprolol  succinate.  He presented to the clinic 06/24/24 for follow-up evaluation and stated he had not noticed much reduction in his blood pressure since transitioning from carvedilol  to metoprolol .  He was tolerating valsartan  well.  Continued to use diltiazem  180 twice daily.  He notef elevated blood pressures in the mornings.  He had been taking his valsartan  at night.  We reviewed secondary causes of hypertension.  He expressed understanding.  He noted that he was in the process of starting back on his BiPAP machine he had not used it for 2 to 3 weeks and was purchasing supplies.  We reviewed medication options.  He is not a candidate for spironolactone  due to nipple sensitivity.  Initially his blood pressure was 166/94 and on recheck was 150/86.  His pulse was 52.  I  increased his valsartan  to 320 mg daily, asked him to maintain blood pressure log, ordered BMP in 1 to 2 weeks and plan follow-up in 2 months.  He contacted the nurse triage line and reported uncontrolled blood pressure.  He was started on chlorthalidone  and carvedilol .  He had follow-up lab work done with his PCP which showed decreased potassium levels.  Chlorthalidone  was  decreased to 12.5 mg daily and he was placed on potassium 40 mill equivalents twice daily.  He presents to the clinic today for follow-up evaluation and states he notes fatigue and is concerned about his potassium.  He reports that he has increased the potassium in his diet as  well as taking his potassium supplementation.  We reviewed his recent lab work.  We reviewed options for blood pressure medication.  Due to his decreased potassium we will stop his chlorthalidone .  Repeat blood pressure is 130/78.  I will start hydralazine 10 mg 3 times daily, repeat be met in 2 weeks, have him continue to avoid nicotine, continue to use his CPAP, begin doing light physical activity and plan follow-up in 4 to 6 weeks.  Today he denies chest pain, shortness of breath, lower extremity edema, fatigue, palpitations, melena, hematuria, hemoptysis, diaphoresis, weakness, presyncope, syncope, orthopnea, and PND.     Home Medications    Prior to Admission medications   Medication Sig Start Date End Date Taking? Authorizing Provider  aspirin  EC 81 MG tablet Take 1 tablet (81 mg total) by mouth daily. 04/22/13   Allred, Lynwood, MD  Cyanocobalamin  (B-12 PO) Take 1,000 mcg by mouth daily.    [provider]  diltiazem  (TIAZAC ) 360 MG 24 hr capsule TAKE 1 CAPSULE DAILY 10/17/20   Pietro Redell RAMAN, MD  furosemide (LASIX) 20 MG tablet Take 20 mg by mouth.    [provider]  losartan  (COZAAR ) 50 MG tablet TAKE ONE TABLET AT BEDTIME 08/22/21   Pietro Redell RAMAN, MD  Potassium Citrate 15 MEQ (1620 MG) TBCR Take 4 tablets by mouth daily. 12/03/18   [provider]  rosuvastatin (CRESTOR) 20 MG tablet Take 20 mg by mouth at bedtime. 01/11/20   [provider]  VITAMIN D  PO Take 2 tablets by mouth daily.    [provider]    Family History    Family History  Problem Relation Age of Onset   Hypertension Father    Heart attack Paternal Grandfather 21   Breast cancer Paternal  Grandmother 47   He indicated that his mother is alive. He indicated that his father is alive. He indicated that the status of his paternal grandmother is unknown. He indicated that his paternal grandfather is deceased.   Social History    Social History   Socioeconomic History   Marital status: Married    Spouse name: Not on file   Number of children: Not on file   Years of education: Not on file   Highest education level: Not on file  Occupational History   Occupation: [employer]  Tobacco Use   Smoking status: Never   Smokeless tobacco: Never  Substance and Sexual Activity   Alcohol use: Yes    Alcohol/week: 0.0 standard drinks of alcohol    Comment: rare   Drug use: No   Sexual activity: Not on file  Other Topics Concern   Not on file  Social History Narrative   Lives in [place].  Works at [employer] as an Art gallery manager   Caffeine use: 1 per day   Right handed    Social Drivers of Corporate investment banker Strain: Not on file  Food Insecurity: Not on file  Transportation Needs: Not on file  Physical Activity: Not on file  Stress: Not on file  Social Connections: Not on file  Intimate Partner Violence: Not on file     Review of Systems    General:  No chills, fever, night sweats or weight changes.  Cardiovascular:  No chest pain, dyspnea on exertion, edema, orthopnea, palpitations, paroxysmal nocturnal dyspnea. Dermatological: No rash, lesions/masses Respiratory: No cough, dyspnea Urologic: No hematuria, dysuria Abdominal:   No nausea, vomiting, diarrhea, bright red blood per rectum, melena, or hematemesis Neurologic:  No visual changes, wkns, changes in mental status. All other systems reviewed and are otherwise negative except as noted above.  Physical Exam    VS:  BP (!) 152/84   Pulse 70   Ht 5' 10 (1.778 m)   Wt 222 lb (100.7 kg)   SpO2 95%   BMI 31.85 kg/m  , BMI Body mass index is 31.85 kg/m. GEN: Well nourished, well developed, in no acute  distress. HEENT: normal. Neck: Supple, no JVD, carotid bruits, or masses. Cardiac: RRR, no murmurs, rubs, or gallops. No clubbing, cyanosis, no edema.  Radials/DP/PT 2+ and equal bilaterally.  Respiratory:  Respirations regular and unlabored, clear to auscultation bilaterally. GI: Soft, nontender, nondistended, BS + x 4. MS: no deformity or atrophy. Skin: warm and dry, no rash. Neuro:  Strength and sensation are intact. Psych: Normal affect.  Accessory Clinical Findings    Recent Labs: 04/23/2024: BUN 11; Creatinine, Ser 1.02; Potassium 3.8; Sodium 143 04/24/2024: ALT 31; Hemoglobin 13.4; Magnesium 1.9; Platelets 201; TSH 1.250   Recent Lipid Panel    Component Value Date/Time   CHOL 162 04/23/2019 1005   TRIG 126 04/23/2019 1005   HDL 44 04/23/2019 1005   CHOLHDL 3.7 04/23/2019 1005   CHOLHDL 4 09/16/2014 1003   VLDL 13.8 09/16/2014 1003   LDLCALC 93 04/23/2019 1005    ECG personally reviewed by me today-none today.  EKG 05/16/2022 normal sinus rhythm 78 bpm no ST or T wave deviation  Myoview  02/01/2020- Nuclear stress EF: 60%. No wall motion abnormalities There was no ST segment deviation noted during stress. The study is normal. This is a low risk study. No evidence of ischemia or infarction.  Echocardiogram 07/06/2021  IMPRESSIONS     1. Left ventricular ejection fraction, by estimation, is 60 to 65%. The  left ventricle has normal function. The left ventricle has no regional  wall motion abnormalities. The left ventricular internal cavity size was  moderately dilated. Left ventricular  diastolic parameters were normal.   2. Right ventricular systolic function is normal. The right ventricular  size is normal.   3. Global longitudinal strain is -24.6%.   4. The mitral valve is normal in structure. Trivial mitral valve  regurgitation.   5. The aortic valve is tricuspid. Aortic valve regurgitation is not  visualized. Mild aortic valve sclerosis is present, with no  evidence of  aortic valve stenosis.   6. Aortic dilatation noted.   Comparison(s): The left ventricular function is unchanged.  Cardiac event monitor 08/07/2021 Patch Wear Time:  10 days and 22 hours (2022-09-11T22:27:06-399 to 2022-09-22T20:51:30-0400)   Patient had a min HR of 51 bpm, max HR of 158 bpm, and avg HR of 72 bpm. Predominant underlying rhythm was Sinus Rhythm. 10 Supraventricular Tachycardia runs occurred, the run with the fastest interval lasting 12 beats with a max rate of 158 bpm, the  longest lasting 11 beats with an avg rate of 97 bpm. Supraventricular Tachycardia was detected within +/- 45 seconds of symptomatic patient event(s). Isolated SVEs were rare (<1.0%), SVE Couplets were rare (<1.0%), and SVE Triplets were rare (<1.0%).  Isolated VEs were rare (<1.0%), VE Couplets were rare (<1.0%), and no VE Triplets were present. Ventricular Trigeminy was present.    Sinus bradycardia, NSR, sinus tachycardia, occasional pac, brief PAT, rare PVC and couplet Redell Shallow  Cardiac CT 11/13 Calcium  score 0  Assessment & Plan   1. Essential hypertension-BP today 152/84 and on recheck 130/78.  He contacted cardiology clinic and continued  to note elevated blood pressure.  He was placed on chlorthalidone .  Lab work showed decreased potassium.  His chlorthalidone  was decreased to 12.5 mg.  He was also placed on 40 mill equivalents of K twice daily.  Carvedilol  6.25 mg was added to his medication regimen.  Initially plan to start hydralazine 25 mg 3 times daily.  With repeat blood pressure being 22 points less systolic on recheck, I will start hydralazine 10 mg 3 times daily and uptitrate if needed. Continue to maintain blood pressure log.  Continue to avoid nicotine. Continue  diltiazem , valsartan ,  carvedilol , potassium Maintain physical activity Continue CPAP Heart healthy low-sodium diet-reviewed Discontinue chlorthalidone  Start hydralazine 10 mg 3 times daily Repeat BMP in 2  weeks  Paroxysmal atrial fibrillation-HR today 70  He previously underwent ablation 2018.  Cardiac event monitor 08/07/2021 showed no significant arrhythmias.  His anticoagulation stopped after ablation procedure.  Continues to deny episodes of accelerated or irregular heartbeat.  Continue diltiazem  Avoid triggers caffeine, chocolate, EtOH, dehydration etc.   Chest discomfort, cervical pain-continues with brief intermittent neurologic type pain .  Stable.  Unchanged. Follows with neurosurgery  Hyperlipidemia-LDL 93 on 04/23/19.  Continue rosuvastatin High-fiber diet Labs with PCP    Disposition: Follow-up with Dr. Pietro or me in 6 weeks  Josefa HERO. Nyilah Kight NP-C     08/26/2024, 3:28 PM [facility] [address] Office [phone] Fax [phone]  Notice: This dictation was prepared with Dragon dictation along with smaller phrase technology. Any transcriptional errors that result from this process are unintentional and may not be corrected upon review.  I spent 14 minutes examining this patient, reviewing medications, and using patient centered shared decision making involving  cardiac care.

## 2024-08-26 ENCOUNTER — Ambulatory Visit: Attending: General Practice | Admitting: General Practice

## 2024-08-26 ENCOUNTER — Encounter: Payer: Self-pay | Admitting: General Practice

## 2024-08-26 VITALS — BP 130/78 | HR 70 | Ht 70.0 in | Wt 222.0 lb

## 2024-08-26 DIAGNOSIS — Z87898 Personal history of other specified conditions: Secondary | ICD-10-CM | POA: Diagnosis not present

## 2024-08-26 DIAGNOSIS — E7800 Pure hypercholesterolemia, unspecified: Secondary | ICD-10-CM | POA: Diagnosis not present

## 2024-08-26 DIAGNOSIS — I10 Essential (primary) hypertension: Secondary | ICD-10-CM | POA: Diagnosis not present

## 2024-08-26 DIAGNOSIS — I480 Paroxysmal atrial fibrillation: Secondary | ICD-10-CM | POA: Diagnosis not present

## 2024-08-26 MED ORDER — HYDRALAZINE HCL 10 MG PO TABS: 10.0000 mg | ORAL_TABLET | Freq: Three times a day (TID) | ORAL | 1 refills | Status: DC

## 2024-08-26 NOTE — Patient Instructions (Addendum)
 Medication Instructions:  STOP [place]  (Chlorthalidone ) START Hydralazine 10mg  Take 1 tablet three times a day *If you need a refill on your cardiac medications before your next appointment, please call your pharmacy*  Lab Work: BMET IN 2 WEEKS (09/09/24) If you have labs (blood work) drawn today and your tests are completely normal, you will receive your results only by: MyChart Message (if you have MyChart) OR A paper copy in the mail If you have any lab test that is abnormal or we need to change your treatment, we will call you to review the results.  Testing/Procedures: NONE ORDERED  Follow-Up: At [facility], you and your health needs are our priority.  As part of our continuing mission to provide you with exceptional heart care, our providers are all part of one team.  This team includes your primary Cardiologist (physician) and Advanced Practice Providers or APPs (Physician Assistants and Nurse Practitioners) who all work together to provide you with the care you need, when you need it.  Your next appointment:   4-6 week(s)  Provider:   Redell Shallow, MD or Josefa Beauvais, NP   We recommend signing up for the patient portal called MyChart.  Sign up information is provided on this After Visit Summary.  MyChart is used to connect with patients for Virtual Visits (Telemedicine).  Patients are able to view lab/test results, encounter notes, upcoming appointments, etc.  Non-urgent messages can be sent to your provider as well.   To learn more about what you can do with MyChart, go to [email or web address].   Other Instructions CONTINUE TO AVOID NICOTINE CONTINUE TO USE CPAP MACHINE EVERY NIGHT  Exercise regularly as told by your doctor. Make sure to weight daily and keep a weight log.   Moderate-intensity exercise is any activity that gets you moving enough to burn at least three times more energy (calories) than if you were sitting. Examples of moderate exercise  include: Walking a mile in 15 minutes. Doing light yard work. Biking at an easy pace. Most people should get at least 150 minutes of moderate-intensity exercise a week to maintain their body weight.  Increase your water intake: Maintain hydration

## 2024-09-03 DIAGNOSIS — N200 Calculus of kidney: Secondary | ICD-10-CM | POA: Diagnosis not present

## 2024-09-03 DIAGNOSIS — N210 Calculus in bladder: Secondary | ICD-10-CM | POA: Diagnosis not present

## 2024-09-03 DIAGNOSIS — R1010 Upper abdominal pain, unspecified: Secondary | ICD-10-CM | POA: Diagnosis not present

## 2024-09-09 DIAGNOSIS — E876 Hypokalemia: Secondary | ICD-10-CM | POA: Diagnosis not present

## 2024-09-10 ENCOUNTER — Ambulatory Visit: Payer: Self-pay | Admitting: Cardiology

## 2024-09-10 LAB — LAB REPORT - SCANNED
EGFR: 102
EGFR: 102

## 2024-09-13 ENCOUNTER — Other Ambulatory Visit: Payer: Self-pay

## 2024-09-13 ENCOUNTER — Emergency Department (HOSPITAL_BASED_OUTPATIENT_CLINIC_OR_DEPARTMENT_OTHER): Admitting: Radiology

## 2024-09-13 ENCOUNTER — Encounter (HOSPITAL_BASED_OUTPATIENT_CLINIC_OR_DEPARTMENT_OTHER): Payer: Self-pay

## 2024-09-13 ENCOUNTER — Telehealth: Payer: Self-pay | Admitting: Cardiology

## 2024-09-13 ENCOUNTER — Emergency Department (HOSPITAL_BASED_OUTPATIENT_CLINIC_OR_DEPARTMENT_OTHER)
Admission: EM | Admit: 2024-09-13 | Discharge: 2024-09-13 | Disposition: A | Discharge status: Home / Self Care | Source: Ambulatory Visit | Attending: Emergency Medicine | Admitting: Emergency Medicine

## 2024-09-13 DIAGNOSIS — I10 Essential (primary) hypertension: Secondary | ICD-10-CM | POA: Insufficient documentation

## 2024-09-13 DIAGNOSIS — R918 Other nonspecific abnormal finding of lung field: Secondary | ICD-10-CM | POA: Diagnosis not present

## 2024-09-13 DIAGNOSIS — R030 Elevated blood-pressure reading, without diagnosis of hypertension: Secondary | ICD-10-CM

## 2024-09-13 DIAGNOSIS — Z7982 Long term (current) use of aspirin: Secondary | ICD-10-CM | POA: Insufficient documentation

## 2024-09-13 DIAGNOSIS — Z79899 Other long term (current) drug therapy: Secondary | ICD-10-CM | POA: Insufficient documentation

## 2024-09-13 DIAGNOSIS — R0789 Other chest pain: Secondary | ICD-10-CM | POA: Insufficient documentation

## 2024-09-13 DIAGNOSIS — R079 Chest pain, unspecified: Secondary | ICD-10-CM | POA: Diagnosis not present

## 2024-09-13 DIAGNOSIS — M25512 Pain in left shoulder: Secondary | ICD-10-CM | POA: Insufficient documentation

## 2024-09-13 LAB — CBC
HCT: 41 % (ref 39.0–52.0)
Hemoglobin: 14 g/dL (ref 13.0–17.0)
MCH: 29.7 pg (ref 26.0–34.0)
MCHC: 34.1 g/dL (ref 30.0–36.0)
MCV: 86.9 fL (ref 80.0–100.0)
Platelets: 216 K/uL (ref 150–400)
RBC: 4.72 MIL/uL (ref 4.22–5.81)
RDW: 12.6 % (ref 11.5–15.5)
WBC: 5.6 K/uL (ref 4.0–10.5)
nRBC: 0 % (ref 0.0–0.2)

## 2024-09-13 LAB — HEPATIC FUNCTION PANEL
ALT: 37 U/L (ref 0–44)
AST: 21 U/L (ref 15–41)
Albumin: 4.6 g/dL (ref 3.5–5.0)
Alkaline Phosphatase: 91 U/L (ref 38–126)
Bilirubin, Direct: 0.3 mg/dL — ABNORMAL HIGH (ref 0.0–0.2)
Indirect Bilirubin: 0.4 mg/dL (ref 0.3–0.9)
Total Bilirubin: 0.7 mg/dL (ref 0.0–1.2)
Total Protein: 7.5 g/dL (ref 6.5–8.1)

## 2024-09-13 LAB — BASIC METABOLIC PANEL WITH GFR
Anion gap: 10 (ref 5–15)
BUN: 15 mg/dL (ref 6–20)
CO2: 27 mmol/L (ref 22–32)
Calcium: 10.1 mg/dL (ref 8.9–10.3)
Chloride: 107 mmol/L (ref 98–111)
Creatinine, Ser: 0.93 mg/dL (ref 0.61–1.24)
GFR, Estimated: >60 mL/min (ref >60)
Glucose, Bld: 122 mg/dL — ABNORMAL HIGH (ref 70–99)
Potassium: 3.5 mmol/L (ref 3.5–5.1)
Sodium: 144 mmol/L (ref 135–145)

## 2024-09-13 LAB — LIPASE, BLOOD: Lipase: 31 U/L (ref 11–51)

## 2024-09-13 LAB — TROPONIN T, HIGH SENSITIVITY: Troponin T High Sensitivity: <15 ng/L (ref 0–19)

## 2024-09-13 MED ORDER — ONDANSETRON HCL 4 MG/2ML IJ SOLN
4.0000 mg | Freq: Once | INTRAMUSCULAR | Status: AC
  Administered 2024-09-13: 4 mg via INTRAVENOUS
  Filled 2024-09-13: qty 2

## 2024-09-13 MED ORDER — KETOROLAC TROMETHAMINE 15 MG/ML IJ SOLN
15.0000 mg | Freq: Once | INTRAMUSCULAR | Status: AC
  Administered 2024-09-13: 15 mg via INTRAVENOUS
  Filled 2024-09-13: qty 1

## 2024-09-13 NOTE — Telephone Encounter (Signed)
 Pt c/o BP issue: STAT if pt c/o blurred vision, one-sided weakness or slurred speech.  STAT if BP is GREATER than 180/120 TODAY.  STAT if BP is LESS than 90/60 and SYMPTOMATIC TODAY  1. What is your BP concern? High BP, patient was seen in ED today 2. Have you taken any BP medication today? Yes  3. What are your last 5 BP readings? 175/104 This morning 188/107 was the highest for today   140/91 while at the hospital, 3hrs after the first two BP readings  4. Are you having any other symptoms (ex. Dizziness, headache, blurred vision, passed out)? Sharp pains in left arm.   Patient was seen in the EM at [facility] due to his symptoms. Patient stated when he was the EM today, they brought his BP down to 140/91. Patient was informed to contact his Cardiologist in regard to this. Patient had recent adjustment with his BP medications and stated that those adjustment have not helped. Please advise.

## 2024-09-13 NOTE — ED Triage Notes (Signed)
 Patient reports left arm pain with pain radiating to the shoulder blade starting last night. He also had an episode of vomiting at 4 am. He reports having alpha-gal so he is unaware if something he ate was cross contamination and that's causing it, or if it is cardiac related. He also reports hypertension and says he has struggled to get that under control with his cardiologist.

## 2024-09-13 NOTE — Telephone Encounter (Signed)
 Spoke with pt, medications confirmed. Aware once dr pietro reviews will be back in touch.

## 2024-09-13 NOTE — ED Notes (Signed)
 Discharge instructions and follow up care reviewed and explained, pt verbalized understanding and had no further questions on d/c. Pt caox4, ambulatory, NAD on d/c.
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
## 2024-09-13 NOTE — Discharge Instructions (Signed)
 I would recommend following up with cardiology for recheck of symptoms and reevaluation of high blood pressure.  In the meantime you can record your blood pressure 1-2 times daily.  For shoulder pain you can consider following up with primary care for further evaluation of cervical radiculopathy.  Return to emergency room with new or worsening symptoms.

## 2024-09-15 MED ORDER — CARVEDILOL 12.5 MG PO TABS: 12.5000 mg | ORAL_TABLET | Freq: Two times a day (BID) | ORAL | 3 refills | Status: DC

## 2024-09-15 NOTE — Telephone Encounter (Signed)
 Pt calling to f/u on what Dr. Pietro suggest regarding Hypertension. Please advise

## 2024-09-15 NOTE — Telephone Encounter (Signed)
 Call to patient to advise Dr. Pietro recommends increasing coreg  to 12.5 mg BID, patient verbalizes understanding and agrees to plan. Patient will track BP readings and send in one week.

## 2024-09-16 ENCOUNTER — Other Ambulatory Visit: Payer: Self-pay | Admitting: Cardiology

## 2024-09-16 DIAGNOSIS — I4891 Unspecified atrial fibrillation: Secondary | ICD-10-CM

## 2024-09-17 MED ORDER — DILTIAZEM HCL ER BEADS 180 MG PO CP24: 180.0000 mg | ORAL_CAPSULE | Freq: Two times a day (BID) | ORAL | 3 refills | Status: AC

## 2024-09-24 NOTE — Telephone Encounter (Signed)
 Left message for patient to call back

## 2024-09-24 NOTE — Telephone Encounter (Signed)
 Patient called back. Patient stated he has been having elevated BP no matter if it's morning or night. Patient stated his current BP 170/101  HR 57 to 67. Patient stated he does not want to go into the weekend with dealing with high BP. Will send message to Dr. Pietro for advisement.

## 2024-09-24 NOTE — Telephone Encounter (Signed)
 Pt is calling back to clarify medication change and reports Bp is still high. Pt would like a c/b from Dr Pietro nurse please advise

## 2024-09-30 ENCOUNTER — Telehealth: Payer: Self-pay | Admitting: Cardiology

## 2024-09-30 ENCOUNTER — Other Ambulatory Visit: Payer: Self-pay | Admitting: Cardiology

## 2024-09-30 NOTE — Telephone Encounter (Signed)
 1. Which medications need to be refilled? (please list name of each medication and dose if known) carvedilol  (COREG ) 12.5 MG tablet      2. Would you like to learn more about the convenience, safety, & potential cost savings by using the [facility]? no     3. Are you open to using the [facility] (Type [facility]. no     4. Which pharmacy/location (including street and city if local pharmacy) is medication to be sent to? [facility] [place], [place] - [address]      5. Do they need a 30 day or 90 day supply? 90 day

## 2024-10-01 MED ORDER — CARVEDILOL 12.5 MG PO TABS: 12.5000 mg | ORAL_TABLET | Freq: Two times a day (BID) | ORAL | 3 refills | Status: AC

## 2024-10-01 NOTE — Telephone Encounter (Signed)
 Refill sent.

## 2024-10-04 NOTE — Progress Notes (Unsigned)
 Cardiology Clinic Note   Patient Name: Kevin Holmes Date of Encounter: 10/05/2024  Primary Care Provider:  Renato Dorothey HERO, NP Primary Cardiologist:  Redell Shallow, MD  Patient Profile    Kevin Holmes is a 58 year old male who presents to the clinic for follow-up evaluation of hypertension.  Past Medical History    Past Medical History:  Diagnosis Date   History of gallstones    LAPAROSCOPIC CHOLECYSTECTOMY 2005   Hypertension    Paroxysmal atrial fibrillation (HCC)    Pure hypercholesterolemia    Sleep apnea    Past Surgical History:  Procedure Laterality Date   ATRIAL FIBRILLATION ABLATION N/A 10/20/2012   Procedure: ATRIAL FIBRILLATION ABLATION;  Surgeon: Lynwood Rakers, MD;  Location: [facility] CATH LAB;  Service: Cardiovascular;  Laterality: N/A;   EXTRACORPOREAL SHOCK WAVE LITHOTRIPSY Left 11/26/2021   Procedure: EXTRACORPOREAL SHOCK WAVE LITHOTRIPSY (ESWL);  Surgeon: Alvaro Hummer, MD;  Location: [facility];  Service: Urology;  Laterality: Left;   LAMINECTOMY     LAPAROSCOPIC CHOLECYSTECTOMY  07/04/2004 [place]   DR. PAUL TOTH   TEE WITHOUT CARDIOVERSION  10/19/2012   Procedure: TRANSESOPHAGEAL ECHOCARDIOGRAM (TEE);  Surgeon: Vina LULLA Gull, MD;  Location: [facility] ENDOSCOPY;  Service: Cardiovascular;  Laterality: N/A;    Allergies  Allergies  Allergen Reactions   Alpha-Gal Anaphylaxis, Diarrhea, Hives and Nausea And Vomiting   Spironolactone  Other (See Comments)    Nipple sensitivity    Other Other (See Comments)    Meat allergy    History of Present Illness    Kevin Holmes is a 58 y/o male with PMH of hypertension, atrial fibrillation, OSA, palpitations, hyperlipidemia, fatigue, and left arm pain.  He underwent ablation in 2018.  His anticoagulation was stopped after that.  Since that time he has had only occasional episodes of palpitations that were nonsustained.  He was noted to have fatigue while on beta blocking medications and was placed  on diltiazem .  He was also placed on Aldactone  which was stopped secondary to breast discomfort.  He was seen by Comer RIGGERS on 08/10/2020.  He reported he was in the process of being evaluated for multiple sclerosis by neurology.    He was sent to the emergency department on 06/16/2021 after visiting his PCP with complaints of palpitations over the previous few weeks as well as right-sided chest discomfort. He had an elevated D-dimer in the office. He denied shortness of breath and exertional chest discomfort. His blood pressure was 130/83, HR 60. EKG showed NSR 81 bpm. CT chest negative for PE, LE doppler ultrasound negative for DVT. His lab work was unremarkable and his troponins were negative. It was recommended that he have a cardiac event monitor or echocardiogram to evaluate his symptoms associated with his lower extremity swelling and fatigue. He was discharged in stable condition the same day.   He presented to the clinic 06/19/2021 for follow-up evaluation stating that he had fatigue for the last 5 years.  His labs and diagnostics from the emergency department were reviewed, noting that his vitamin D  was on the low side of normal. He has Alpha-gal syndrome from a tick bite, so he does not eat much meat. He reported compliance with his CPAP. He had noticed some increased LE swelling and had been placed on furosemide as needed by his PCP.  He was also taking supplemental potassium.  A vitamin B12 level and repeat echocardiogram were ordered. He was given a salty 6 diet sheet, compression stocking sheet,  and was scheduled for a 3 month follow-up. It was recommended that he restart vitamin D  supplementation as well.   He had an echocardiogram 07/06/2021 which showed LVEF 60-65% and no significant valvular abnormalities.   He presented the clinic for follow-up evaluation stated he stopped taking his furosemide. He noticed that after he stopped taking the medication that his palpitations significantly  reduced.  He continued to take his potassium for hypokalemia.  His echocardiogram and medication regimen were reviewed. He was not physically active due to travelling and nasal congestion. He was not wearing CPAP. Losartan  was switched to valsartan  with repeat BMP in 1 week and he was instructed to record a BP log and exercise as tolerated. Follow-up in 3 months was planned.  He was seen by Dr. Pietro on 01/04/2022. He was having dyspnea with exertion but not at rest. He denied chest pain, orthopnea, PND, and LE edema.  He contacted the nurse triage line on 05/10/2022 with complaints of chest discomfort that was occurring more frequently that radiated to his arms and neck. His PCP felt that his discomfort was related to a pinched nerve.  He presented in clinic 05/16/22 for follow-up evaluation stating that he had noticed episodes of right arm pain and chest discomfort.  He had completed a 2500 mile motorcycle trip.  He then had a benefit ride shortly after and noticed increased pain along his right arm.  He  noticed that when he would lay flat at night.  He presented to his PCP who ordered an MRI study of his neck.  His previous CT scans and cholesterol were reviewed.  His discomfort appeared to be atypical, MSK, versus reflux type pain.  He reported that he did not have a formal exercise routine.  He was asked to increase his physical activity, continue to refrain from tobacco use, and planned to continue his medical therapy. Follow-up in 9 to 12 months was planned.  He was seen in follow-up by Rosaline Pippin, PA-C on 03/04/2023.  He had been in the emergency department with chest pain on 02/07/2023.  It was relieved with Maalox and famotidine .  At the time of his cardiology visit he reported having sharp pain on the right side of his chest that would come and go at any time.  He continued to have cervical neck problems with pain that would go down his left arm.  He had pain with shoulder movements.  He denied  chest pressure, dyspnea, palpitations, and lower extremity swelling.  He was somewhat physically active doing yard work but did not have formal exercise routine.  He reported reflux type symptoms and pain after he would eat.  He was not taking his omeprazole regularly.  He reported liking to eat hot spicy food.  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  He was started on Protonix  daily.   He presented to the clinic 12/25/23 for follow-up evaluation and stated he had been doing fairly well.  He had not been exercising as much since summer.  He  noted that he exercised more when the weather was nice.  He reported  that he noticed his blood pressure was better when he was using his CPAP.  His blood pressure was  initially 162/92 and on recheck was 148/82.  He was limited in his physical activity somewhat due to his spinal pain.  He did have some success losing weight with intermittent fasting.  His valsartan  was increased to 80 mg daily. His follow-up BMET showed stable electrolytes and renal function.  He presented to the clinic 04/02/24 for follow-up evaluation and stated he had not noticed much change with his blood pressure with increased valsartan .  Secondary causes of hypertension were reviewed.  He reported difficult to control hypertension for quite some time.  He continued to be physically active and rode his motorcycle to the beach the prior weekend.  He previously did not tolerate HCTZ due to low potassium. Carvedilol  3.125 mg twice daily was started and his valsartan  was increased to 160 mg daily.  He was askedto continue to eat a low-sodium diet. Repeat a BMP in 1 to 2 weeks and have him follow-up in 2 to 3 months.  He was seen and evaluated in the ED 6/25 with elevated blood pressure.  He contacted cardiology office and his carvedilol  was discontinued.  He was placed on metoprolol  succinate.  He presented to the clinic 06/24/24 for  follow-up evaluation and stated he had not noticed much reduction in his blood pressure since transitioning from carvedilol  to metoprolol .  He was tolerating valsartan  well.  Continued to use diltiazem  180 twice daily.  He noted elevated blood pressures in the mornings.  He had been taking his valsartan  at night.  We reviewed secondary causes of hypertension.  He expressed understanding.  He noted that he was in the process of starting back on his BiPAP machine he had not used it for 2 to 3 weeks and was purchasing supplies. Medication options were reviewed. He is not a candidate for spironolactone  due to nipple sensitivity.  Initially his blood pressure was 166/94 and on recheck was 150/86.  His pulse was 52. Valsartan  was increased to 320 mg daily, asked him to maintain blood pressure log, ordered BMP in 1 to 2 weeks and plan follow-up in 2 months.  He contacted the nurse triage line and reported uncontrolled blood pressure.  He was started on chlorthalidone  and carvedilol .  He had follow-up lab work done with his PCP which showed decreased potassium levels.  Chlorthalidone  was decreased to 12.5 mg daily and he was placed on potassium 40 mill equivalents twice daily.  He presented to the clinic 08/26/24 for follow-up evaluation and stated he noted fatigue and was concerned about his potassium.  He reported that he had increased the potassium in his diet as well as taking his potassium supplementation.  We reviewed his  lab work.  We reviewed options for blood pressure medication.  Due to his decreased potassium his chlorthalidone  was discontinued.  Repeat blood pressure was 130/78. Hydralazine  10 mg 3 times daily was ordered, planned repeat be met in 2 weeks, asked him to continue to avoid nicotine, continue to use his CPAP, begin doing light physical activity and planned follow-up in 4 to 6 weeks.   He presents to the clinic today for follow-up evaluation. He is still having elevated BPs at home,  155-160/95-98. He did note one episode after a motorcycle ride where he was dizzy and nauseated. He did experience diarrhea. His BP was 110/80. This only occurred one time. He has been doing well with decreasing sodium in his diet for the last couple of weeks, but has not noticed a change in his BP. He has not been exercising regularly. He is wearing CPAP every night and is abstaining from nicotine. His BP is 158/88 today and 152/92 on my repeat. He denies chest pain, shortness of breath, worsened headache, vision  changes, palpitations.  Home Medications    Prior to Admission medications   Medication Sig Start Date End Date Taking? Authorizing Provider  aspirin  EC 81 MG tablet Take 1 tablet (81 mg total) by mouth daily. 04/22/13   Allred, Lynwood, MD  Cyanocobalamin  (B-12 PO) Take 1,000 mcg by mouth daily.    [provider]  diltiazem  (TIAZAC ) 360 MG 24 hr capsule TAKE 1 CAPSULE DAILY 10/17/20   Pietro Redell RAMAN, MD  furosemide (LASIX) 20 MG tablet Take 20 mg by mouth.    [provider]  losartan  (COZAAR ) 50 MG tablet TAKE ONE TABLET AT BEDTIME 08/22/21   Pietro Redell RAMAN, MD  Potassium Citrate 15 MEQ (1620 MG) TBCR Take 4 tablets by mouth daily. 12/03/18   [provider]  rosuvastatin (CRESTOR) 20 MG tablet Take 20 mg by mouth at bedtime. 01/11/20   [provider]  VITAMIN D  PO Take 2 tablets by mouth daily.    [provider]    Family History    Family History  Problem Relation Age of Onset   Hypertension Father    Heart attack Paternal Grandfather 86   Breast cancer Paternal Grandmother 52   He indicated that his mother is alive. He indicated that his father is alive. He indicated that the status of his paternal grandmother is unknown. He indicated that his paternal grandfather is deceased.  Social History    Social History   Socioeconomic History   Marital status: Married    Spouse name: Not on file   Number of children: Not on file    Years of education: Not on file   Highest education level: Not on file  Occupational History   Occupation: [employer]  Tobacco Use   Smoking status: Never   Smokeless tobacco: Never  Substance and Sexual Activity   Alcohol use: Yes    Alcohol/week: 0.0 standard drinks of alcohol    Comment: rare   Drug use: No   Sexual activity: Not on file  Other Topics Concern   Not on file  Social History Narrative   Lives in [place].  Works at [employer] as an art gallery manager   Caffeine use: 1 per day   Right handed    Social Drivers of Corporate Investment Banker Strain: Not on file  Food Insecurity: Not on file  Transportation Needs: Not on file  Physical Activity: Not on file  Stress: Not on file  Social Connections: Not on file  Intimate Partner Violence: Not on file     Review of Systems    General:  No chills, fever, night sweats or weight changes.  Cardiovascular:  No chest pain, dyspnea on exertion, edema, orthopnea, palpitations, paroxysmal nocturnal dyspnea. Dermatological: No rash, lesions/masses Respiratory: No cough, dyspnea Urologic: No hematuria, dysuria Abdominal:   No nausea, vomiting, diarrhea, bright red blood per rectum, melena, or hematemesis Neurologic:  No visual changes, weakness, changes in mental status. All other systems reviewed and are otherwise negative except as noted above.  Physical Exam    VS:  BP (!) 158/88 (BP Location: Right Arm, Patient Position: Sitting, Cuff Size: Normal)   Pulse 69   Ht 5' 10 (1.778 m)   Wt 221 lb (100.2 kg)   SpO2 97%   BMI 31.71 kg/m  , BMI Body mass index is 31.71 kg/m. GEN: Well nourished, well developed, in no acute distress. HEENT: normal. Neck: Supple, no JVD, carotid bruits, or masses. Cardiac: RRR, no murmurs, rubs, or gallops. No  clubbing, cyanosis, no edema.  Radials/DP/PT 2+ and equal bilaterally.  Respiratory:  Respirations regular and unlabored, clear to auscultation bilaterally. GI: Soft, nontender,  nondistended. MS: no deformity or atrophy. Skin: warm and dry, no rash. Neuro:  Strength and sensation are intact. Psych: Normal affect.  Accessory Clinical Findings    Recent Labs: 04/24/2024: Magnesium 1.9; TSH 1.250 09/13/2024: ALT 37; BUN 15; Creatinine, Ser 0.93; Hemoglobin 14.0; Platelets 216; Potassium 3.5; Sodium 144   Recent Lipid Panel    Component Value Date/Time   CHOL 162 04/23/2019 1005   TRIG 126 04/23/2019 1005   HDL 44 04/23/2019 1005   CHOLHDL 3.7 04/23/2019 1005   CHOLHDL 4 09/16/2014 1003   VLDL 13.8 09/16/2014 1003   LDLCALC 93 04/23/2019 1005    ECG-none today.  EKG 05/16/2022 normal sinus rhythm 78 bpm no ST or T wave deviation  Myoview  02/01/2020- Nuclear stress EF: 60%. No wall motion abnormalities There was no ST segment deviation noted during stress. The study is normal. This is a low risk study. No evidence of ischemia or infarction.  Echocardiogram 07/06/2021  IMPRESSIONS     1. Left ventricular ejection fraction, by estimation, is 60 to 65%. The  left ventricle has normal function. The left ventricle has no regional  wall motion abnormalities. The left ventricular internal cavity size was  moderately dilated. Left ventricular  diastolic parameters were normal.   2. Right ventricular systolic function is normal. The right ventricular  size is normal.   3. Global longitudinal strain is -24.6%.   4. The mitral valve is normal in structure. Trivial mitral valve  regurgitation.   5. The aortic valve is tricuspid. Aortic valve regurgitation is not  visualized. Mild aortic valve sclerosis is present, with no evidence of  aortic valve stenosis.   6. Aortic dilatation noted.   Comparison(s): The left ventricular function is unchanged.  Cardiac event monitor 08/07/2021 Patch Wear Time:  10 days and 22 hours (2022-09-11T22:27:06-399 to 2022-09-22T20:51:30-0400)   Patient had a min HR of 51 bpm, max HR of 158 bpm, and avg HR of 72 bpm.  Predominant underlying rhythm was Sinus Rhythm. 10 Supraventricular Tachycardia runs occurred, the run with the fastest interval lasting 12 beats with a max rate of 158 bpm, the  longest lasting 11 beats with an avg rate of 97 bpm. Supraventricular Tachycardia was detected within +/- 45 seconds of symptomatic patient event(s). Isolated SVEs were rare (<1.0%), SVE Couplets were rare (<1.0%), and SVE Triplets were rare (<1.0%).  Isolated VEs were rare (<1.0%), VE Couplets were rare (<1.0%), and no VE Triplets were present. Ventricular Trigeminy was present.    Sinus bradycardia, NSR, sinus tachycardia, occasional pac, brief PAT, rare PVC and couplet Redell Shallow  Cardiac CT 11/13 Calcium  score 0  Assessment & Plan   1. Resistant hypertension: BP today 158/88, 152/92 on my repeat. Previously he he was placed on chlorthalidone .  Lab work showed decreased potassium.  His chlorthalidone  was decreased to 12.5 mg.  He was also placed on 40 mEqs of K twice daily.  Carvedilol  6.25 mg was added to his medication regimen. Hydralazine  10 mg added during last visit.  - Increase hydralazine  to 25 mg TID - Order renal artery duplex - Maintain blood pressure log - Continue  diltiazem , valsartan ,  carvedilol , potassium - Increase physical activity as tolerated - Continue CPAP - Heart healthy, low-sodium diet  2. Paroxysmal atrial fibrillation: HR today is 69.  He had ablation 2018.  His cardiac event monitor  08/07/2021 showed no significant arrhythmias.  Anticoagulation was discontinued after ablation procedure.  He denies palpitations.  - Continue diltiazem  - Avoid triggers such as caffeine, chocolate, EtOH, dehydration, etc.  3. Hyperlipidemia: LDL 93 on 04/23/19 - High-fiber diet - Continue rosuvastatin - Follows with PCP  Disposition: Follow-up with Josefa Beauvais in 1-2 months.  Saddie Cleaves, NP   10/05/2024, 4:00 PM [facility] [address] Office  [phone] Fax [phone]

## 2024-10-05 ENCOUNTER — Encounter: Payer: Self-pay | Admitting: General Practice

## 2024-10-05 ENCOUNTER — Ambulatory Visit: Attending: General Practice

## 2024-10-05 VITALS — BP 158/88 | HR 69 | Ht 70.0 in | Wt 221.0 lb

## 2024-10-05 DIAGNOSIS — E782 Mixed hyperlipidemia: Secondary | ICD-10-CM | POA: Diagnosis not present

## 2024-10-05 DIAGNOSIS — I1A0 Resistant hypertension: Secondary | ICD-10-CM | POA: Diagnosis not present

## 2024-10-05 DIAGNOSIS — I10 Essential (primary) hypertension: Secondary | ICD-10-CM | POA: Diagnosis not present

## 2024-10-05 DIAGNOSIS — I480 Paroxysmal atrial fibrillation: Secondary | ICD-10-CM

## 2024-10-05 MED ORDER — HYDRALAZINE HCL 25 MG PO TABS: 25.0000 mg | ORAL_TABLET | Freq: Three times a day (TID) | ORAL | 6 refills | Status: DC

## 2024-10-05 NOTE — Patient Instructions (Signed)
 Medication Instructions:  INCREASE Hydralazine  to 25mg  Take 1 tablet three times a day  *If you need a refill on your cardiac medications before your next appointment, please call your pharmacy*  Lab Work: None ordered If you have labs (blood work) drawn today and your tests are completely normal, you will receive your results only by: MyChart Message (if you have MyChart) OR A paper copy in the mail If you have any lab test that is abnormal or we need to change your treatment, we will call you to review the results.  Testing/Procedures: Your physician has requested that you have a renal artery duplex. During this test, an ultrasound is used to evaluate blood flow to the kidneys. Allow one hour for this exam. Do not eat after midnight the day before and avoid carbonated beverages. Take your medications as you usually do.   Follow-Up: At [facility], you and your health needs are our priority.  As part of our continuing mission to provide you with exceptional heart care, our providers are all part of one team.  This team includes your primary Cardiologist (physician) and Advanced Practice Providers or APPs (Physician Assistants and Nurse Practitioners) who all work together to provide you with the care you need, when you need it.  Your next appointment:   1-2 month(s)  Provider:   Josefa Beauvais, NP  We recommend signing up for the patient portal called MyChart.  Sign up information is provided on this After Visit Summary.  MyChart is used to connect with patients for Virtual Visits (Telemedicine).  Patients are able to view lab/test results, encounter notes, upcoming appointments, etc.  Non-urgent messages can be sent to your provider as well.   To learn more about what you can do with MyChart, go to [email or web address].   Other Instructions

## 2024-10-29 ENCOUNTER — Ambulatory Visit: Payer: Self-pay

## 2024-10-29 ENCOUNTER — Ambulatory Visit (HOSPITAL_COMMUNITY)
Admission: RE | Admit: 2024-10-29 | Discharge: 2024-10-29 | Disposition: A | Discharge status: Home / Self Care | Source: Ambulatory Visit

## 2024-10-29 DIAGNOSIS — I10 Essential (primary) hypertension: Secondary | ICD-10-CM | POA: Diagnosis not present

## 2024-11-24 NOTE — Progress Notes (Signed)
 "   Cardiology Clinic Note   Patient Name: Kevin Holmes Date of Encounter: 11/26/2024  Primary Care Provider:  Renato Dorothey HERO, NP Primary Cardiologist:  Kevin Shallow, MD  Patient Profile    Kevin Holmes 59 year old male presents the clinic for follow-up follow-up evaluation of his  HTN, and atrial fibrillation.  Past Medical History    Past Medical History:  Diagnosis Date   History of gallstones    LAPAROSCOPIC CHOLECYSTECTOMY 2005   Hypertension    Paroxysmal atrial fibrillation (HCC)    Pure hypercholesterolemia    Sleep apnea    Past Surgical History:  Procedure Laterality Date   ATRIAL FIBRILLATION ABLATION N/A 10/20/2012   Procedure: ATRIAL FIBRILLATION ABLATION;  Surgeon: Kevin Rakers, MD;  Location: [facility] CATH LAB;  Service: Cardiovascular;  Laterality: N/A;   EXTRACORPOREAL SHOCK WAVE LITHOTRIPSY Left 11/26/2021   Procedure: EXTRACORPOREAL SHOCK WAVE LITHOTRIPSY (ESWL);  Surgeon: Kevin Hummer, MD;  Location: [facility];  Service: Urology;  Laterality: Left;   LAMINECTOMY     LAPAROSCOPIC CHOLECYSTECTOMY  07/04/2004 [place]   DR. PAUL Holmes   TEE WITHOUT CARDIOVERSION  10/19/2012   Procedure: TRANSESOPHAGEAL ECHOCARDIOGRAM (TEE);  Surgeon: Kevin LULLA Gull, MD;  Location: [facility] ENDOSCOPY;  Service: Cardiovascular;  Laterality: N/A;    Allergies  Allergies  Allergen Reactions   Alpha-Gal Anaphylaxis, Diarrhea, Hives and Nausea And Vomiting   Spironolactone  Other (See Comments)    Nipple sensitivity    Other Other (See Comments)    Meat allergy    History of Present Illness    [name] is a 59 y.o. male with a hx of hypertension, atrial fibrillation, OSA, palpitations, hyperlipidemia, fatigue, and left arm pain.  He underwent ablation in 2018.  His anticoagulation was stopped after that.  Since that time he has had only occasional episodes of palpitations that were nonsustained.  He was noted to have fatigue while on beta blocking  medications and was placed on diltiazem .  He was also placed on Aldactone  which was stopped secondary to breast discomfort.  He was last seen by Kevin Holmes on 08/10/2020.  He reported he was in the process of being evaluated for multiple sclerosis by neurology.     He presented to the emergency department on 06/16/2021 complaints of palpitations and chest discomfort.  He reported that his chest discomfort was on his right side.  He also noted increased palpitations over the past few weeks.  He presented to his PCP who ordered blood work.  He was noted to have an elevated D-dimer and was sent to the emergency department for further evaluation.  He denied shortness of breath.  He denied exertional chest discomfort.  His blood pressure at that time was 130/83 with a pulse of 60.  His EKG showed normal sinus rhythm 81 bpm.  His CT chest showed no evidence of PE.  Doppler ultrasound of his lower extremity showed no evidence of DVT.  His lab work was unremarkable and his troponins were negative.  It was recommended that he have a cardiac event monitor or echocardiogram to evaluate his symptoms associated with his lower extremity swelling and fatigue.  He was discharged in stable condition on 06/16/2021.   He presented to the clinic 06/19/2021 for follow-up evaluation stated he had fatigue for the last 5 years.  We reviewed his labs and diagnostics from the emergency department.  His vitamin D  was on the low side of normal.  He has Alpha-gal syndrome from a  tick bite.  He does not eat as much meat.  He reported compliance with his CPAP.  He had noticed some increased lower extremity swelling had been placed on furosemide as needed by his PCP.  He was also taking supplemental potassium.  I will ordered a vitamin B12 lab, repeated his echocardiogram, gave him a salty 6 diet sheet, gave the [place] support stocking sheet, and planned follow-up in 3 months.  I  asked him to start back with his vitamin D  supplementation as  well.  His echocardiogram 07/06/2021 showed LVEF 60-65% and no significant valvular abnormalities.   He presented the clinic for follow-up evaluation stated he stopped taking his furosemide.  He noticed that after he stopped taking the medication this palpitations significantly reduced.  He continued to take his potassium for hypokalemia.  We reviewed his echocardiogram and his medication regimen.  He reported that he was recently traveling and  had some nasal congestion.  He was not very physically active.   During that time he was not using his CPAP.  He felt that his blood pressure was elevated due to not using CPAP.  We discussed stopping losartan  and starting valsartan  medication, maintaining a blood pressure log and planned to have a BMP in 1 week.  Follow-up in 3 months was planned.  I have asked him to increase his physical activity as tolerated.  He was seen by Dr. Pietro on 01/04/2022.  During that time he described more dyspnea with more extreme activities but not with routine activities.  His breathing returned to baseline at rest.  He denied chest pain.  He denied orthopnea PND and lower extremity swelling.  He denied exertional chest discomfort.  He contacted the nurse triage line on 05/10/2022.  During that time he reported having chest discomfort more frequently over the last couple months.  He described the discomfort as being off and on in his arms and neck.  He reported that he had seen his PCP and had an EKG.  His PCP felt that his discomfort was related to a pinched nerve.  He presented in clinic 05/16/22 for follow-up evaluation stated he had noticed episodes of right arm pain and chest discomfort.  He had completed a 2500 mile motorcycle trip.  He then had a benefit ride shortly after and noticed increased pain along his right arm.  He  noticed that when he would lay flat at night.  He presented to his PCP who has ordered a MRI study of his neck.  We reviewed his previous CT scans and  recent cholesterol.  His discomfort appeared to be atypical, MSK, versus reflux type pain.  He reported that he did not have a formal exercise routine.  I  asked him to increase his physical activity, continue to refrain from tobacco use, and  planned to continue his  medical therapy.   follow-up in 9 to 12 months was planned.  He was seen in follow-up by Rosaline Pippin, PA-C on 03/04/2023.  He had been in the emergency department with chest pain on 02/07/2023.  It was relieved with Maalox and famotidine .  At the time of his cardiology visit he reported having sharp pain on the right side of his chest that would come and go at any time.  He continued to have cervical neck problems with pain that would go down his left arm.  He had pain with shoulder movements.  He denied chest pressure, dyspnea, palpitations, and lower extremity swelling.  He was somewhat  physically active doing yard work but did not have formal exercise routine.  He reported reflux type symptoms and pain after he would eat.  He was not taking his omeprazole regularly.  He reported liking to eat hot spicy food.  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  He was started on Protonix  daily.  He was encouraged to follow-up with neurosurgery.  He presented to the clinic 12/25/23 for follow-up evaluation and stated he had been doing fairly well.  He had not been exercising as much since summer.  He  noted that he exercised more when the weather was nice.  He reported  that he noticed his blood pressure I was better when he was using his CPAP.  His blood pressure was  initially 162/92 and on recheck was 148/82.  He was limited in his physical activity somewhat due to his spinal pain.  He did have some success losing weight with intermittent fasting.  I increased his valsartan  to 80 mg daily, his follow-up he met showed stable electrolytes and renal function.    He presented to the clinic  04/02/24 for follow-up evaluation and stated he had not noticed much change with his blood pressure with increased valsartan .  We reviewed secondary causes of hypertension.  He reported difficult to control hypertension for quite some time.  He continued to be physically active and rode his motorcycle to the beach the prior weekend.  He previously did not tolerate HCTZ due to low potassium.  I ordered carvedilol  3.125 mg twice daily and increase his valsartan  to 160 mg daily.  I  asked him to continue to eat a low-sodium diet.  I planned repeat a BMP in 1 to 2 weeks and have him follow-up in 2 to 3 months.  He was seen and evaluated in the ED 6/25 with elevated blood pressure.  He contacted cardiology office and his carvedilol  was discontinued.  He was placed on metoprolol  succinate.  He presented to the clinic 06/24/24 for follow-up evaluation and stated he had not noticed much reduction in his blood pressure since transitioning from carvedilol  to metoprolol .  He was tolerating valsartan  well.  Continued to use diltiazem  180 twice daily.  He notef elevated blood pressures in the mornings.  He had been taking his valsartan  at night.  We reviewed secondary causes of hypertension.  He expressed understanding.  He noted that he was in the process of starting back on his BiPAP machine he had not used it for 2 to 3 weeks and was purchasing supplies.  We reviewed medication options.  He is not a candidate for spironolactone  due to nipple sensitivity.  Initially his blood pressure was 166/94 and on recheck was 150/86.  His pulse was 52.  I  increased his valsartan  to 320 mg daily, asked him to maintain blood pressure log, ordered BMP in 1 to 2 weeks and plan follow-up in 2 months.  He contacted the nurse triage line and reported uncontrolled blood pressure.  He was started on chlorthalidone  and carvedilol .  He had follow-up lab work done with his PCP which showed decreased potassium levels.  Chlorthalidone  was  decreased to 12.5 mg daily and he was placed on potassium 40 mill equivalents twice daily.  He was seen in the clinic on 08/26/2024.  He continued to have elevated blood pressure.  We started hydralazine  10 mg 3 times daily and plan follow-up in 4 to 6  weeks.  He was seen in follow-up on 10/05/2024 by  Nola, NP.  His blood pressure was noted to be 158/88.  Renal artery ultrasound was ordered and showed no evidence of renal artery stenosis bilaterally.  His hydralazine  was increased to 25 mg 3 times daily.  He presents to the clinic today for follow-up evaluation and states he continues to notice elevation in his blood pressure.  He has noticed that with higher salt meals his blood pressure goes up and stays elevated.  He is trying to be more disciplined with low-sodium diet.  We reviewed all of his blood pressure medications.  He reports compliance with these.  Initially in the office today his blood pressure is 158/92 and on recheck is 156/90.  He does report that he is under a significant amount of stress related to his job and both of his parents being sick.  He has had intermittent low blood pressures where he describes blood pressures in the 110 systolic.  He feels bad with this.  However, this is not typical.  We discussed options for further treatment and medication.  I will increase his hydralazine  to 50 mg 3 times daily and refer him to the advanced hypertension clinic.  We also reviewed his renal ultrasound which showed no renal artery stenosis.  Today he denies chest pain, shortness of breath, lower extremity edema, fatigue, palpitations, melena, hematuria, hemoptysis, diaphoresis, weakness, presyncope, syncope, orthopnea, and PND.     Home Medications    Prior to Admission medications   Medication Sig Start Date End Date Taking? Authorizing Provider  aspirin  EC 81 MG tablet Take 1 tablet (81 mg total) by mouth daily. 04/22/13   Allred, Lynwood, MD  Cyanocobalamin  (B-12 PO) Take 1,000 mcg  by mouth daily.    [provider]  diltiazem  (TIAZAC ) 360 MG 24 hr capsule TAKE 1 CAPSULE DAILY 10/17/20   Kevin [name] RAMAN, MD  furosemide (LASIX) 20 MG tablet Take 20 mg by mouth.    [provider]  losartan  (COZAAR ) 50 MG tablet TAKE ONE TABLET AT BEDTIME 08/22/21   Kevin [name] RAMAN, MD  Potassium Citrate 15 MEQ (1620 MG) TBCR Take 4 tablets by mouth daily. 12/03/18   [provider]  rosuvastatin (CRESTOR) 20 MG tablet Take 20 mg by mouth at bedtime. 01/11/20   [provider]  VITAMIN D  PO Take 2 tablets by mouth daily.    [provider]    Family History    Family History  Problem Relation Age of Onset   Hypertension Father    Heart attack Paternal Grandfather 5   Breast cancer Paternal Grandmother 24   He indicated that his mother is alive. He indicated that his father is alive. He indicated that the status of his paternal grandmother is unknown. He indicated that his paternal grandfather is deceased.   Social History    Social History   Socioeconomic History   Marital status: Married    Spouse name: Not on file   Number of children: Not on file   Years of education: Not on file   Highest education level: Not on file  Occupational History   Occupation: [employer]  Tobacco Use   Smoking status: Never   Smokeless tobacco: Never  Substance and Sexual Activity   Alcohol use: Yes    Alcohol/week: 0.0 standard drinks of alcohol    Comment: rare   Drug use: No   Sexual activity: Not on file  Other Topics Concern  Not on file  Social History Narrative   Lives in [place].  Works at [employer] as an art gallery manager   Caffeine use: 1 per day   Right handed    Social Drivers of Health   Tobacco Use: Low Risk (11/26/2024)   Patient History    Smoking Tobacco Use: Never    Smokeless Tobacco Use: Never    Passive Exposure: Not on file  Financial Resource Strain: Not on file  Food Insecurity: Not on file  Transportation Needs: Not on file   Physical Activity: Not on file  Stress: Not on file  Social Connections: Not on file  Intimate Partner Violence: Not on file  Depression (EYV7-0): Not on file  Alcohol Screen: Not on file  Housing: Not on file  Utilities: Not on file  Health Literacy: Not on file     Review of Systems    General:  No chills, fever, night sweats or weight changes.  Cardiovascular:  No chest pain, dyspnea on exertion, edema, orthopnea, palpitations, paroxysmal nocturnal dyspnea. Dermatological: No rash, lesions/masses Respiratory: No cough, dyspnea Urologic: No hematuria, dysuria Abdominal:   No nausea, vomiting, diarrhea, bright red blood per rectum, melena, or hematemesis Neurologic:  No visual changes, wkns, changes in mental status. All other systems reviewed and are otherwise negative except as noted above.  Physical Exam    VS:  BP (!) 156/90   Pulse 76   Ht 5' 10 (1.778 m)   Wt 225 lb 9.6 oz (102.3 kg)   SpO2 97%   BMI 32.37 kg/m  , BMI Body mass index is 32.37 kg/m. GEN: Well nourished, well developed, in no acute distress. HEENT: normal. Neck: Supple, no JVD, carotid bruits, or masses. Cardiac: RRR, no murmurs, rubs, or gallops. No clubbing, cyanosis, no edema.  Radials/DP/PT 2+ and equal bilaterally.  Respiratory:  Respirations regular and unlabored, clear to auscultation bilaterally. GI: Soft, nontender, nondistended, BS + x 4. MS: no deformity or atrophy. Skin: warm and dry, no rash. Neuro:  Strength and sensation are intact. Psych: Normal affect.  Accessory Clinical Findings    Recent Labs: 04/24/2024: Magnesium 1.9; TSH 1.250 09/13/2024: ALT 37; BUN 15; Creatinine, Ser 0.93; Hemoglobin 14.0; Platelets 216; Potassium 3.5; Sodium 144   Recent Lipid Panel    Component Value Date/Time   CHOL 162 04/23/2019 1005   TRIG 126 04/23/2019 1005   HDL 44 04/23/2019 1005   CHOLHDL 3.7 04/23/2019 1005   CHOLHDL 4 09/16/2014 1003   VLDL 13.8 09/16/2014 1003   LDLCALC 93  04/23/2019 1005    ECG personally reviewed by me today-none today.  EKG 05/16/2022 normal sinus rhythm 78 bpm no ST or T wave deviation  Myoview  02/01/2020- Nuclear stress EF: 60%. No wall motion abnormalities There was no ST segment deviation noted during stress. The study is normal. This is a low risk study. No evidence of ischemia or infarction.  Echocardiogram 07/06/2021  IMPRESSIONS     1. Left ventricular ejection fraction, by estimation, is 60 to 65%. The  left ventricle has normal function. The left ventricle has no regional  wall motion abnormalities. The left ventricular internal cavity size was  moderately dilated. Left ventricular  diastolic parameters were normal.   2. Right ventricular systolic function is normal. The right ventricular  size is normal.   3. Global longitudinal strain is -24.6%.   4. The mitral valve is normal in structure. Trivial mitral valve  regurgitation.   5. The aortic valve is tricuspid. Aortic valve regurgitation  is not  visualized. Mild aortic valve sclerosis is present, with no evidence of  aortic valve stenosis.   6. Aortic dilatation noted.   Comparison(s): The left ventricular function is unchanged.  Cardiac event monitor 08/07/2021 Patch Wear Time:  10 days and 22 hours (2022-09-11T22:27:06-399 to 2022-09-22T20:51:30-0400)   Patient had a min HR of 51 bpm, max HR of 158 bpm, and avg HR of 72 bpm. Predominant underlying rhythm was Sinus Rhythm. 10 Supraventricular Tachycardia runs occurred, the run with the fastest interval lasting 12 beats with a max rate of 158 bpm, the  longest lasting 11 beats with an avg rate of 97 bpm. Supraventricular Tachycardia was detected within +/- 45 seconds of symptomatic patient event(s). Isolated SVEs were rare (<1.0%), SVE Couplets were rare (<1.0%), and SVE Triplets were rare (<1.0%).  Isolated VEs were rare (<1.0%), VE Couplets were rare (<1.0%), and no VE Triplets were present. Ventricular Trigeminy  was present.    Sinus bradycardia, NSR, sinus tachycardia, occasional pac, brief PAT, rare PVC and couplet Kevin Holmes  Cardiac CT 11/13 Calcium  score 0  Assessment & Plan   1. Essential hypertension-BP today 156/90.  Not a candidate for chlorthalidone  due to decreased potassium.  Not a candidate for spironolactone  due to nipple sensitivity.  Renal artery ultrasound showed no renal artery stenosis bilaterally.   Continue to maintain blood pressure log.  Continue to avoid nicotine. Continue  diltiazem , valsartan ,  carvedilol , potassium,  Increase hydralazine  50 mg TID Maintain physical activity Continue CPAP Heart healthy low-sodium diet-reviewed Refer to advanced hypertension clinic  Paroxysmal atrial fibrillation-HR today 76.  Ablation 2018.  Cardiac event monitor 08/07/2021 showed no significant arrhythmias.  His anticoagulation stopped after ablation procedure.  Continues to deny episodes of accelerated or irregular heartbeat.  Intermittently noted to have heart rates in the low 50s. Continue diltiazem  Avoid triggers caffeine, chocolate, EtOH, dehydration etc.   Chest discomfort, cervical pain-continues with brief intermittent neurologic type pain .  Stable.  Unchanged. Follows with neurosurgery  Hyperlipidemia-LDL 93 on 04/23/19.  Continue rosuvastatin High-fiber diet Labs with PCP    Disposition: Follow-up with Dr. Shallow or me in 4 months.  Follow-up with the advanced hypertension clinic.  Josefa Holmes. Soul Hackman NP-C     11/26/2024, 3:40 PM [facility] [address] Office [phone] Fax [phone]  Notice: This dictation was prepared with Dragon dictation along with smaller phrase technology. Any transcriptional errors that result from this process are unintentional and may not be corrected upon review.  I spent 14 minutes examining this patient, reviewing medications, and using patient centered shared decision making  involving  cardiac care.  "

## 2024-11-26 ENCOUNTER — Ambulatory Visit: Admitting: General Practice

## 2024-11-26 ENCOUNTER — Encounter: Payer: Self-pay | Admitting: General Practice

## 2024-11-26 VITALS — BP 156/90 | HR 76 | Ht 70.0 in | Wt 225.6 lb

## 2024-11-26 DIAGNOSIS — I480 Paroxysmal atrial fibrillation: Secondary | ICD-10-CM | POA: Diagnosis not present

## 2024-11-26 DIAGNOSIS — Z87898 Personal history of other specified conditions: Secondary | ICD-10-CM | POA: Diagnosis not present

## 2024-11-26 DIAGNOSIS — E7849 Other hyperlipidemia: Secondary | ICD-10-CM | POA: Diagnosis not present

## 2024-11-26 DIAGNOSIS — I1A0 Resistant hypertension: Secondary | ICD-10-CM

## 2024-11-26 DIAGNOSIS — I10 Essential (primary) hypertension: Secondary | ICD-10-CM | POA: Diagnosis not present

## 2024-11-26 MED ORDER — HYDRALAZINE HCL 50 MG PO TABS: 50.0000 mg | ORAL_TABLET | Freq: Three times a day (TID) | ORAL | 2 refills | Status: AC

## 2024-11-26 NOTE — Patient Instructions (Signed)
 Medication Instructions:  INCREASE HYDRALAZINE  TO 50MG  THREE TIMES A DAY *If you need a refill on your cardiac medications before your next appointment, please call your pharmacy*  Lab Work: NO L ABS If you have labs (blood work) drawn today and your tests are completely normal, you will receive your results only by: MyChart Message (if you have MyChart) OR A paper copy in the mail If you have any lab test that is abnormal or we need to change your treatment, we will call you to review the results.  Testing/Procedures: NO TESTING  Follow-Up: At [facility], you and your health needs are our priority.  As part of our continuing mission to provide you with exceptional heart care, our providers are all part of one team.  This team includes your primary Cardiologist (physician) and Advanced Practice Providers or APPs (Physician Assistants and Nurse Practitioners) who all work together to provide you with the care you need, when you need it.  Your next appointment:   4 month(s)  Provider:   Redell Shallow, MD      Other Instructions You have been referred to Hypertension Clinic to see Annabella Scarce, MD or Reche Finder, NP   [facility] [place]:

## 2025-04-22 ENCOUNTER — Ambulatory Visit: Admitting: Cardiology
